# Patient Record
Sex: Female | Born: 1975 | Race: White | Hispanic: No | Marital: Single | Smoking: Never smoker
Health system: Southern US, Community
[De-identification: ages and names within clinical notes are randomized; demographics above are authoritative.]

## PROBLEM LIST (undated history)

## (undated) DIAGNOSIS — D241 Benign neoplasm of right breast: Secondary | ICD-10-CM

## (undated) DIAGNOSIS — Z113 Encounter for screening for infections with a predominantly sexual mode of transmission: Secondary | ICD-10-CM

## (undated) DIAGNOSIS — B379 Candidiasis, unspecified: Secondary | ICD-10-CM

## (undated) DIAGNOSIS — R319 Hematuria, unspecified: Secondary | ICD-10-CM

## (undated) DIAGNOSIS — E119 Type 2 diabetes mellitus without complications: Secondary | ICD-10-CM

## (undated) DIAGNOSIS — N898 Other specified noninflammatory disorders of vagina: Secondary | ICD-10-CM

## (undated) DIAGNOSIS — I10 Essential (primary) hypertension: Secondary | ICD-10-CM

## (undated) DIAGNOSIS — N946 Dysmenorrhea, unspecified: Secondary | ICD-10-CM

## (undated) DIAGNOSIS — A630 Anogenital (venereal) warts: Secondary | ICD-10-CM

## (undated) DIAGNOSIS — R51 Headache: Secondary | ICD-10-CM

## (undated) DIAGNOSIS — F4310 Post-traumatic stress disorder, unspecified: Secondary | ICD-10-CM

## (undated) DIAGNOSIS — F419 Anxiety disorder, unspecified: Secondary | ICD-10-CM

## (undated) DIAGNOSIS — R300 Dysuria: Secondary | ICD-10-CM

## (undated) DIAGNOSIS — J302 Other seasonal allergic rhinitis: Secondary | ICD-10-CM

## (undated) DIAGNOSIS — N644 Mastodynia: Secondary | ICD-10-CM

## (undated) DIAGNOSIS — N393 Stress incontinence (female) (male): Secondary | ICD-10-CM

## (undated) DIAGNOSIS — F32A Depression, unspecified: Secondary | ICD-10-CM

## (undated) DIAGNOSIS — R519 Headache, unspecified: Secondary | ICD-10-CM

## (undated) DIAGNOSIS — F329 Major depressive disorder, single episode, unspecified: Secondary | ICD-10-CM

## (undated) DIAGNOSIS — G43019 Migraine without aura, intractable, without status migrainosus: Secondary | ICD-10-CM

## (undated) DIAGNOSIS — K219 Gastro-esophageal reflux disease without esophagitis: Secondary | ICD-10-CM

## (undated) DIAGNOSIS — N63 Unspecified lump in unspecified breast: Secondary | ICD-10-CM

## (undated) HISTORY — DX: Candidiasis, unspecified: B37.9

## (undated) HISTORY — DX: Other specified noninflammatory disorders of vagina: N89.8

## (undated) HISTORY — DX: Mastodynia: N64.4

## (undated) HISTORY — DX: Migraine without aura, intractable, without status migrainosus: G43.019

## (undated) HISTORY — DX: Hematuria, unspecified: R31.9

## (undated) HISTORY — DX: Type 2 diabetes mellitus without complications: E11.9

## (undated) HISTORY — DX: Dysmenorrhea, unspecified: N94.6

## (undated) HISTORY — DX: Encounter for screening for infections with a predominantly sexual mode of transmission: Z11.3

## (undated) HISTORY — DX: Stress incontinence (female) (male): N39.3

## (undated) HISTORY — DX: Benign neoplasm of right breast: D24.1

## (undated) HISTORY — DX: Unspecified lump in unspecified breast: N63.0

## (undated) HISTORY — DX: Dysuria: R30.0

## (undated) HISTORY — DX: Post-traumatic stress disorder, unspecified: F43.10

## (undated) HISTORY — DX: Anogenital (venereal) warts: A63.0

---
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
## 2010-01-06 ENCOUNTER — Ambulatory Visit (HOSPITAL_COMMUNITY): Admission: RE | Admit: 2010-01-06 | Discharge: 2010-01-06 | Payer: Self-pay | Admitting: Obstetrics & Gynecology

## 2010-11-01 ENCOUNTER — Other Ambulatory Visit
Admission: RE | Admit: 2010-11-01 | Discharge: 2010-11-01 | Payer: Self-pay | Source: Home / Self Care | Admitting: Obstetrics and Gynecology

## 2010-11-07 ENCOUNTER — Encounter: Payer: Self-pay | Admitting: Obstetrics & Gynecology

## 2011-03-04 ENCOUNTER — Ambulatory Visit (HOSPITAL_COMMUNITY)
Admission: RE | Admit: 2011-03-04 | Discharge: 2011-03-04 | Disposition: A | Discharge status: Home / Self Care | Payer: Medicaid Other | Source: Ambulatory Visit | Attending: Family Medicine | Admitting: Family Medicine

## 2011-03-04 ENCOUNTER — Other Ambulatory Visit (HOSPITAL_COMMUNITY): Payer: Self-pay | Admitting: Family Medicine

## 2011-03-04 DIAGNOSIS — K7689 Other specified diseases of liver: Secondary | ICD-10-CM | POA: Insufficient documentation

## 2011-03-04 DIAGNOSIS — R748 Abnormal levels of other serum enzymes: Secondary | ICD-10-CM | POA: Insufficient documentation

## 2011-03-04 DIAGNOSIS — R1011 Right upper quadrant pain: Secondary | ICD-10-CM | POA: Insufficient documentation

## 2011-03-04 DIAGNOSIS — R110 Nausea: Secondary | ICD-10-CM | POA: Insufficient documentation

## 2011-03-04 MED ORDER — IOHEXOL 300 MG/ML  SOLN
100.0000 mL | Freq: Once | INTRAMUSCULAR | Status: AC | PRN
  Administered 2011-03-04: 100 mL via INTRAVENOUS

## 2011-06-09 ENCOUNTER — Other Ambulatory Visit (HOSPITAL_COMMUNITY): Payer: Self-pay | Admitting: Pediatrics

## 2011-06-13 ENCOUNTER — Ambulatory Visit (HOSPITAL_COMMUNITY)
Admission: RE | Admit: 2011-06-13 | Discharge: 2011-06-13 | Disposition: A | Discharge status: Home / Self Care | Payer: Medicaid Other | Source: Ambulatory Visit | Attending: Pediatrics | Admitting: Pediatrics

## 2011-06-13 ENCOUNTER — Encounter (HOSPITAL_COMMUNITY): Payer: Self-pay

## 2011-06-13 DIAGNOSIS — R109 Unspecified abdominal pain: Secondary | ICD-10-CM | POA: Insufficient documentation

## 2011-06-13 MED ORDER — TECHNETIUM TC 99M MEBROFENIN IV KIT
5.0000 | PACK | Freq: Once | INTRAVENOUS | Status: AC | PRN
  Administered 2011-06-13: 5.5 via INTRAVENOUS

## 2011-06-13 MED ORDER — SINCALIDE 5 MCG IJ SOLR
0.0200 ug/kg | Freq: Once | INTRAMUSCULAR | Status: AC
  Administered 2011-06-13: 1.82 ug via INTRAVENOUS
  Filled 2011-06-13: qty 1

## 2012-02-13 ENCOUNTER — Other Ambulatory Visit: Payer: Self-pay | Admitting: Obstetrics and Gynecology

## 2012-02-13 ENCOUNTER — Encounter (HOSPITAL_COMMUNITY): Payer: Self-pay

## 2012-02-13 ENCOUNTER — Other Ambulatory Visit: Payer: Self-pay

## 2012-02-13 ENCOUNTER — Encounter (HOSPITAL_COMMUNITY): Payer: Self-pay | Admitting: Pharmacy Technician

## 2012-02-13 ENCOUNTER — Encounter (HOSPITAL_COMMUNITY)
Admission: RE | Admit: 2012-02-13 | Discharge: 2012-02-13 | Disposition: A | Discharge status: Home / Self Care | Payer: Medicaid Other | Source: Ambulatory Visit | Attending: Obstetrics and Gynecology | Admitting: Obstetrics and Gynecology

## 2012-02-13 HISTORY — DX: Other seasonal allergic rhinitis: J30.2

## 2012-02-13 HISTORY — DX: Essential (primary) hypertension: I10

## 2012-02-13 HISTORY — DX: Depression, unspecified: F32.A

## 2012-02-13 HISTORY — DX: Anxiety disorder, unspecified: F41.9

## 2012-02-13 HISTORY — DX: Major depressive disorder, single episode, unspecified: F32.9

## 2012-02-13 LAB — URINALYSIS, ROUTINE W REFLEX MICROSCOPIC
Glucose, UA: NEGATIVE mg/dL
Nitrite: NEGATIVE
Specific Gravity, Urine: 1.03 (ref 1.005–1.030)

## 2012-02-13 LAB — URINE MICROSCOPIC-ADD ON

## 2012-02-13 LAB — CBC
Hemoglobin: 12.2 g/dL (ref 12.0–15.0)
MCH: 29.6 pg (ref 26.0–34.0)
MCHC: 33.5 g/dL (ref 30.0–36.0)
MCV: 88.3 fL (ref 78.0–100.0)
RBC: 4.12 MIL/uL (ref 3.87–5.11)
WBC: 9.1 10*3/uL (ref 4.0–10.5)

## 2012-02-13 LAB — HCG, SERUM, QUALITATIVE: Preg, Serum: NEGATIVE

## 2012-02-13 NOTE — Patient Instructions (Signed)
22 Cindy Stevenson  02/13/2012   Your procedure is scheduled on:  Tuesday, 02/14/12  Report to [facility] at 1220 AM.  Call this number if you have problems the morning of surgery: [phone]   Remember:   Do not eat food:After Midnight.  May have clear liquids:until Midnight .  Clear liquids include soda, tea, black coffee, apple or grape juice, broth.  Take these medicines the morning of surgery with A SIP OF WATER: wellbutrin and valium if needed.   Do not wear jewelry, make-up or nail polish.  Do not wear lotions, powders, or perfumes. You may wear deodorant.  Do not shave 48 hours prior to surgery.  Do not bring valuables to the hospital.  Contacts, dentures or bridgework may not be worn into surgery.  PATIENT INSTRUCTIONS POST-ANESTHESIA  IMMEDIATELY FOLLOWING SURGERY:  Do not drive or operate machinery for the first twenty four hours after surgery.  Do not make any important decisions for twenty four hours after surgery or while taking narcotic pain medications or sedatives.  If you develop intractable nausea and vomiting or a severe headache please notify your doctor immediately.  FOLLOW-UP:  Please make an appointment with your surgeon as instructed. You do not need to follow up with anesthesia unless specifically instructed to do so.  WOUND CARE INSTRUCTIONS (if applicable):  Keep a dry clean dressing on the anesthesia/puncture wound site if there is drainage.  Once the wound has quit draining you may leave it open to air.  Generally you should leave the bandage intact for twenty four hours unless there is drainage.  If the epidural site drains for more than 36-48 hours please call the anesthesia department.  QUESTIONS?:  Please feel free to call your physician or the hospital operator if you have any questions, and they will be happy to assist you.     [facility] Anesthesia Department [address] [place] [phone]      Leave suitcase in the car. After  surgery it may be brought to your room.  For patients admitted to the hospital, checkout time is 11:00 AM the day of discharge.   Patients discharged the day of surgery will not be allowed to drive home.  Name and phone number of your driver: driver  Special Instructions: CHG Shower Use Special Wash: 1/2 bottle night before surgery and 1/2 bottle morning of surgery.   Please read over the following fact sheets that you were given: Pain Booklet, MRSA Information, Surgical Site Infection Prevention, Anesthesia Post-op Instructions and Care and Recovery After Surgery

## 2012-02-14 ENCOUNTER — Ambulatory Visit (HOSPITAL_COMMUNITY)
Admission: RE | Admit: 2012-02-14 | Discharge: 2012-02-14 | Disposition: A | Discharge status: Home / Self Care | Payer: Medicaid Other | Source: Ambulatory Visit | Attending: Obstetrics and Gynecology | Admitting: Obstetrics and Gynecology

## 2012-02-14 ENCOUNTER — Encounter (HOSPITAL_COMMUNITY)
Admission: RE | Disposition: A | Discharge status: Home / Self Care | Payer: Self-pay | Source: Ambulatory Visit | Attending: Obstetrics and Gynecology

## 2012-02-14 ENCOUNTER — Encounter (HOSPITAL_COMMUNITY): Payer: Self-pay | Admitting: Anesthesiology

## 2012-02-14 ENCOUNTER — Ambulatory Visit (HOSPITAL_COMMUNITY): Payer: Medicaid Other | Admitting: Anesthesiology

## 2012-02-14 DIAGNOSIS — Z79899 Other long term (current) drug therapy: Secondary | ICD-10-CM | POA: Insufficient documentation

## 2012-02-14 DIAGNOSIS — N906 Unspecified hypertrophy of vulva: Secondary | ICD-10-CM | POA: Insufficient documentation

## 2012-02-14 DIAGNOSIS — Z01812 Encounter for preprocedural laboratory examination: Secondary | ICD-10-CM | POA: Insufficient documentation

## 2012-02-14 DIAGNOSIS — I10 Essential (primary) hypertension: Secondary | ICD-10-CM | POA: Insufficient documentation

## 2012-02-14 HISTORY — PX: LABIOPLASTY: SHX1900

## 2012-02-14 LAB — BASIC METABOLIC PANEL
Chloride: 103 mEq/L (ref 96–112)
Creatinine, Ser: 0.98 mg/dL (ref 0.50–1.10)
GFR calc Af Amer: 87 mL/min — ABNORMAL LOW (ref >90)
Potassium: 3.5 mEq/L (ref 3.5–5.1)
Sodium: 139 mEq/L (ref 135–145)

## 2012-02-14 SURGERY — LABIAPLASTY, VULVA
Anesthesia: General | Laterality: Right | Wound class: Clean Contaminated

## 2012-02-14 MED ORDER — LACTATED RINGERS IV SOLN
INTRAVENOUS | Status: DC
  Administered 2012-02-14: 14:00:00 via INTRAVENOUS

## 2012-02-14 MED ORDER — MIDAZOLAM HCL 2 MG/2ML IJ SOLN
1.0000 mg | INTRAMUSCULAR | Status: DC | PRN
  Administered 2012-02-14: 2 mg via INTRAVENOUS

## 2012-02-14 MED ORDER — FENTANYL CITRATE 0.05 MG/ML IJ SOLN: 25.0000 ug | INTRAMUSCULAR | Status: DC | PRN

## 2012-02-14 MED ORDER — ONDANSETRON HCL 4 MG/2ML IJ SOLN: 4.0000 mg | Freq: Once | INTRAMUSCULAR | Status: DC | PRN

## 2012-02-14 MED ORDER — FENTANYL CITRATE 0.05 MG/ML IJ SOLN
INTRAMUSCULAR | Status: DC | PRN
  Administered 2012-02-14: 50 ug via INTRAVENOUS
  Administered 2012-02-14 (×2): 25 ug via INTRAVENOUS

## 2012-02-14 MED ORDER — LIDOCAINE HCL (PF) 1 % IJ SOLN
INTRAMUSCULAR | Status: AC
  Filled 2012-02-14: qty 5

## 2012-02-14 MED ORDER — 0.9 % SODIUM CHLORIDE (POUR BTL) OPTIME
TOPICAL | Status: DC | PRN
  Administered 2012-02-14: 1000 mL

## 2012-02-14 MED ORDER — LIDOCAINE HCL 1 % IJ SOLN
INTRAMUSCULAR | Status: DC | PRN
  Administered 2012-02-14: 40 mg via INTRADERMAL

## 2012-02-14 MED ORDER — FENTANYL CITRATE 0.05 MG/ML IJ SOLN
INTRAMUSCULAR | Status: AC
  Filled 2012-02-14: qty 5

## 2012-02-14 MED ORDER — MIDAZOLAM HCL 2 MG/2ML IJ SOLN
INTRAMUSCULAR | Status: AC
  Filled 2012-02-14: qty 2

## 2012-02-14 MED ORDER — BACITRACIN ZINC 500 UNIT/GM EX OINT
TOPICAL_OINTMENT | CUTANEOUS | Status: AC
  Filled 2012-02-14: qty 0.9

## 2012-02-14 MED ORDER — PROPOFOL 10 MG/ML IV EMUL
INTRAVENOUS | Status: AC
  Filled 2012-02-14: qty 20

## 2012-02-14 MED ORDER — IBUPROFEN 200 MG PO TABS: 600.0000 mg | ORAL_TABLET | Freq: Four times a day (QID) | ORAL | Status: AC | PRN

## 2012-02-14 MED ORDER — PROPOFOL 10 MG/ML IV BOLUS
INTRAVENOUS | Status: DC | PRN
  Administered 2012-02-14: 150 mg via INTRAVENOUS

## 2012-02-14 MED ORDER — BUPIVACAINE-EPINEPHRINE 0.5% -1:200000 IJ SOLN
INTRAMUSCULAR | Status: DC | PRN
  Administered 2012-02-14: 8 mL
  Administered 2012-02-14: 5 mL

## 2012-02-14 MED ORDER — BACITRACIN ZINC 500 UNIT/GM EX OINT
TOPICAL_OINTMENT | CUTANEOUS | Status: DC | PRN
  Administered 2012-02-14: 1 via TOPICAL

## 2012-02-14 MED ORDER — OXYCODONE-ACETAMINOPHEN 5-325 MG PO TABS: 1.0000 | ORAL_TABLET | ORAL | Status: AC | PRN

## 2012-02-14 MED ORDER — MIDAZOLAM HCL 2 MG/2ML IJ SOLN
INTRAMUSCULAR | Status: AC
  Administered 2012-02-14: 2 mg via INTRAVENOUS
  Filled 2012-02-14: qty 2

## 2012-02-14 MED ORDER — BUPIVACAINE-EPINEPHRINE PF 0.5-1:200000 % IJ SOLN
INTRAMUSCULAR | Status: AC
  Filled 2012-02-14: qty 10

## 2012-02-14 SURGICAL SUPPLY — 22 items
BAG HAMPER (MISCELLANEOUS) ×2 IMPLANT
CLOTH BEACON ORANGE TIMEOUT ST (SAFETY) ×2 IMPLANT
COVER SURGICAL LIGHT HANDLE (MISCELLANEOUS) ×4 IMPLANT
DECANTER SPIKE VIAL GLASS SM (MISCELLANEOUS) ×2 IMPLANT
ELECT REM PT RETURN 9FT ADLT (ELECTROSURGICAL) ×2
ELECTRODE REM PT RTRN 9FT ADLT (ELECTROSURGICAL) ×1 IMPLANT
FORMALIN 10 PREFIL 120ML (MISCELLANEOUS) ×2 IMPLANT
GLOVE BIO SURGEON STRL SZ 6.5 (GLOVE) ×4 IMPLANT
GLOVE BIOGEL PI IND STRL 7.0 (GLOVE) ×2 IMPLANT
GLOVE BIOGEL PI INDICATOR 7.0 (GLOVE) ×2
GLOVE ECLIPSE 7.0 STRL STRAW (GLOVE) ×2 IMPLANT
GLOVE ECLIPSE 9.0 STRL (GLOVE) ×2 IMPLANT
GLOVE INDICATOR STER SZ 9 (GLOVE) ×2 IMPLANT
GOWN STRL REIN 3XL LVL4 (GOWN DISPOSABLE) ×2 IMPLANT
GOWN STRL REIN XL XLG (GOWN DISPOSABLE) ×4 IMPLANT
KIT ROOM TURNOVER AP CYSTO (KITS) ×2 IMPLANT
MANIFOLD NEPTUNE II (INSTRUMENTS) ×2 IMPLANT
NS IRRIG 1000ML POUR BTL (IV SOLUTION) ×2 IMPLANT
PACK PERI GYN (CUSTOM PROCEDURE TRAY) ×2 IMPLANT
PAD ARMBOARD 7.5X6 YLW CONV (MISCELLANEOUS) ×2 IMPLANT
SET BASIN LINEN APH (SET/KITS/TRAYS/PACK) ×2 IMPLANT
SUT VIC AB 4-0 PS2 27 (SUTURE) ×2 IMPLANT

## 2012-02-14 NOTE — Anesthesia Postprocedure Evaluation (Signed)
  Anesthesia Post-op Note  Patient: Cindy Stevenson  Procedure(s) Performed: Procedure(s) (LRB): LABIAPLASTY (Right)  Patient Location: PACU  Anesthesia Type: General  Level of Consciousness: awake, alert  and oriented  Airway and Oxygen Therapy: Patient Spontanous Breathing and Patient connected to face mask oxygen  Post-op Pain: none  Post-op Assessment: Post-op Vital signs reviewed, Patient's Cardiovascular Status Stable, Respiratory Function Stable and Patent Airway  Post-op Vital Signs: Reviewed and stable  Complications: No apparent anesthesia complications

## 2012-02-14 NOTE — Interval H&P Note (Signed)
History and Physical Interval Note:  02/14/2012 2:06 PM  Cindy Stevenson  has presented today for surgery, with the diagnosis of labial hypertrophy  The various methods of treatment have been discussed with the patient and family. After consideration of risks, benefits and other options for treatment, the patient has consented to  Procedure(s) (LRB): LABIAPLASTY (Right) as a surgical intervention .  The patients' history has been reviewed, patient examined, no change in status, stable for surgery.  I have reviewed the patients' chart and labs.  Questions were answered to the patient's satisfaction.  Patient chose against attempting office excision.   [name]

## 2012-02-14 NOTE — Brief Op Note (Signed)
02/14/2012  3:16 PM  PATIENT:  Cindy Stevenson  36 y.o. female  PRE-OPERATIVE DIAGNOSIS:  Right labial hypertrophy  POST-OPERATIVE DIAGNOSIS:  Right labial hypertrophy  PROCEDURE:  Procedure(s) (LRB): LABIAPLASTY (Right)  SURGEON:  Surgeon(s) and Role:    * [name], MD - Primary  PHYSICIAN ASSISTANT:   ASSISTANTS: none   ANESTHESIA:   local and general  EBL:  Total I/O In: 400 [I.V.:400] Out: 0   BLOOD ADMINISTERED:none  DRAINS: none   LOCAL MEDICATIONS USED:  MARCAINE    and Amount: 10 ml  SPECIMEN:  No Specimen  DISPOSITION OF SPECIMEN:  N/A  COUNTS:  YES  TOURNIQUET:  * No tourniquets in log *  DICTATION: .Dragon Dictation patient was prepped in low lithotomy position with CHG prep to avoid iodine, due to shellfish allergy.. The redundant asymmetric labial tissue on the right were inspected and efforts made to achieve symmetry by excising redundant tissues, and a 3 cm x 3 cm to, 2 layers thick portion of redundant labia minora tissue was removed and subcuticular 4-0 Vicryl used to close the incision. Marcaine with epinephrine 1 :100,000 was infiltrated prior to the cutting to assist with analgesia and vascularity. A acceptable tissue its approximation was achieved with 10 years running 4-0 Vicryl and 2 interrupted sutures of subcuticular 4-0 Vicryl a total of 10 cc of Marcaine was infiltrated in the underlying tissues pressure dressing an ice pack will be applied and patient will be sent home from her postop care  PLAN OF CARE: Discharge to home after PACU  PATIENT DISPOSITION:  PACU - hemodynamically stable.   Delay start of Pharmacological VTE agent (>24hrs) due to surgical blood loss or risk of bleeding: not applicable

## 2012-02-14 NOTE — Anesthesia Procedure Notes (Signed)
Procedure Name: LMA Insertion Date/Time: 02/14/2012 2:41 PM Performed by: Glynn Octave E Pre-anesthesia Checklist: Patient identified, Patient being monitored, Emergency Drugs available, Timeout performed and Suction available Patient Re-evaluated:Patient Re-evaluated prior to inductionOxygen Delivery Method: Circle System Utilized Preoxygenation: Pre-oxygenation with 100% oxygen Intubation Type: IV induction Ventilation: Mask ventilation without difficulty LMA: LMA inserted LMA Size: 3.0 Number of attempts: 1 Placement Confirmation: positive ETCO2 and breath sounds checked- equal and bilateral

## 2012-02-14 NOTE — Brief Op Note (Signed)
02/14/2012  3:20 PM  PATIENT:  Cindy Stevenson  36 y.o. female  PRE-OPERATIVE DIAGNOSIS:  Right labial hypertrophy  POST-OPERATIVE DIAGNOSIS:  Right labial hypertrophy  PROCEDURE:  Procedure(s) (LRB): LABIAPLASTY (Right)  SURGEON:  Surgeon(s) and Role:    * [name], MD - Primary  PHYSICIAN ASSISTANT:   ASSISTANTS: none   ANESTHESIA:   local and general  EBL:  Total I/O In: 400 [I.V.:400] Out: 0   BLOOD ADMINISTERED:none  DRAINS: none   LOCAL MEDICATIONS USED:  MARCAINE    and Amount: 10 ml  SPECIMEN:  No Specimen  DISPOSITION OF SPECIMEN:  N/A  COUNTS:  YES  TOURNIQUET:  * No tourniquets in log *  DICTATION: .Dragon Dictation  PLAN OF CARE: Discharge to home after PACU  PATIENT DISPOSITION:  PACU - hemodynamically stable.   Delay start of Pharmacological VTE agent (>24hrs) due to surgical blood loss or risk of bleeding: not applicable

## 2012-02-14 NOTE — H&P (Signed)
Cindy Stevenson is an 36 y.o. female. With asymmetric labial hypertrophy on the patient's right side which results in the labia minora on the right side protruding approximately 3 cm beyond the left, passed labia majora and subjectively painful to clothinging walking and intimacy.   Pertinent Gynecological History: Menses: flow is light Bleeding: Normal monthly Contraception: OCP (estrogen/progesterone) recently discontinued DES exposure: unknown Blood transfusions: None Sexually transmitted diseases: no past history and GC and Chlamydia cultures negative, recent testing for HIV hepatitis negative  GYN Procedures: None  Last mammogram: Not required yet Date:  Last pap: normal Date:  01/25/2012 OB History: G1, P0010   Menstrual History: Menarche age:  No LMP recorded. 12/23/2011 had discontinuation of pills at that time. Abstinent at present    Past Medical History  Diagnosis Date  . Hypertension   . Anxiety   . Seasonal allergies   . Depression     Past Surgical History  Procedure Date  . Wisdom tooth extraction 1997    Dr. Manson Passey    Family History  Problem Relation Age of Onset  . Lung cancer Mother     Social History:  reports that she has never smoked. She does not have any smokeless tobacco history on file. She reports that she does not drink alcohol or use illicit drugs.  Allergies:  Allergies  Allergen Reactions  . Codeine     Vomiting  . Shellfish Allergy     Breaks out in Hives    No prescriptions prior to admission    Review of Systems  Constitutional: Positive for fever.  Eyes: Negative for blurred vision.  Respiratory: Negative for shortness of breath.   Cardiovascular: Negative for chest pain and palpitations.  Gastrointestinal: Negative.   Genitourinary: Negative.   Neurological: Negative.  Negative for headaches. Focal weakness: current medications Wellbutrin, Valium.  Psychiatric/Behavioral: Negative for depression.       Current medications  Wellbutrin, Valium    There were no vitals taken for this visit. Physical Exam  Constitutional: She appears well-developed and well-nourished.       Weight 210 pounds blood pressure 128/88  HENT:  Head: Normocephalic and atraumatic.  Eyes: Pupils are equal, round, and reactive to light.  Neck: Neck supple. No thyromegaly present.  Cardiovascular: Normal rate and regular rhythm.   Respiratory: Effort normal and breath sounds normal.  GI: Soft. Bowel sounds are normal. She exhibits no distension. There is no tenderness.  Genitourinary: Vagina normal.       Asymmetric labia minora, right side protuberant 2-3 cm beyond the left side and the subjectively painful during intimacy  Psychiatric: She has a normal mood and affect. Her behavior is normal. Judgment and thought content normal.    Results for orders placed during the hospital encounter of 02/13/12 (from the past 24 hour(s))  SURGICAL PCR SCREEN     Status: Normal   Collection Time   02/13/12  9:49 AM      Component Value Range   MRSA, PCR NEGATIVE  NEGATIVE    Staphylococcus aureus NEGATIVE  NEGATIVE   URINALYSIS, ROUTINE W REFLEX MICROSCOPIC     Status: Abnormal   Collection Time   02/13/12  9:50 AM      Component Value Range   Color, Urine Agrusa (*) YELLOW    APPearance CLOUDY (*) CLEAR    Specific Gravity, Urine 1.030  1.005 - 1.030    pH 5.5  5.0 - 8.0    Glucose, UA NEGATIVE  NEGATIVE (mg/dL)  Hgb urine dipstick LARGE (*) NEGATIVE    Bilirubin Urine SMALL (*) NEGATIVE    Ketones, ur NEGATIVE  NEGATIVE (mg/dL)   Protein, ur 324 (*) NEGATIVE (mg/dL)   Urobilinogen, UA 0.2  0.0 - 1.0 (mg/dL)   Nitrite NEGATIVE  NEGATIVE    Leukocytes, UA NEGATIVE  NEGATIVE   URINE MICROSCOPIC-ADD ON     Status: Abnormal   Collection Time   02/13/12  9:50 AM      Component Value Range   RBC / HPF TOO NUMEROUS TO COUNT  <3 (RBC/hpf)   Bacteria, UA MANY (*) RARE   CBC     Status: Abnormal   Collection Time   02/13/12 10:00 AM       Component Value Range   WBC 9.1  4.0 - 10.5 (K/uL)   RBC 4.12  3.87 - 5.11 (MIL/uL)   Hemoglobin 12.2  12.0 - 15.0 (g/dL)   HCT 40.1  02.7 - 25.3 (%)   MCV 88.3  78.0 - 100.0 (fL)   MCH 29.6  26.0 - 34.0 (pg)   MCHC 33.5  30.0 - 36.0 (g/dL)   RDW 66.4  40.3 - 47.4 (%)   Platelets 437 (*) 150 - 400 (K/uL)  HCG, SERUM, QUALITATIVE     Status: Normal   Collection Time   02/13/12 10:00 AM      Component Value Range   Preg, Serum NEGATIVE  NEGATIVE     No results found.  Assessment/Plan:  labial hypertrophy right labia minora for outpatient labioplasty plans are to reduce size symmetrically on the right.  Richerd Grime V 02/14/2012, 12:05 AM

## 2012-02-14 NOTE — Discharge Instructions (Addendum)
Ice pack for 20 minutes every 2 hour applied to s the perineum. Light pressure may be applied if oozing develops. I called Dr. Emelda Fear at [phone] office hours or [phone] after hours (cell.) Tonight.   Wound Care Wound care helps prevent pain and infection.  You may need a tetanus shot if:  You cannot remember when you had your last tetanus shot.   You have never had a tetanus shot.   The injury broke your skin.  If you need a tetanus shot and you choose not to have one, you may get tetanus. Sickness from tetanus can be serious. HOME CARE   Only take medicine as told by your doctor.   Clean the wound daily with mild soap and water.   Change any bandages (dressings) as told by your doctor.   Put medicated cream and a bandage on the wound as told by your doctor.   Change the bandage if it gets wet, dirty, or starts to smell.   Take showers. Do not take baths, swim, or do anything that puts your wound under water.   Rest and raise (elevate) the wound until the pain and puffiness (swelling) are better.   Keep all doctor visits as told.  GET HELP RIGHT AWAY IF:   Yellowish-white fluid (pus) comes from the wound.   Medicine does not lessen your pain.   There is a red streak going away from the wound.   You cannot move your finger or toe.   You have a fever.  MAKE SURE YOU:   Understand these instructions.   Will watch your condition.   Will get help right away if you are not doing well or get worse.  Document Released: 07/12/2008 Document Revised: 09/22/2011 Document Reviewed: 02/06/2011 [facility] Patient Information 2012 [place], [place].General Anesthetic, Adult A doctor specialized in giving anesthesia (anesthesiologist) or a nurse specialized in giving anesthesia (nurse anesthetist) gives medicine that makes you sleep while a procedure is performed (general anesthetic). Once the general anesthetic has been administered, you will be in a sleeplike state in which you  feel no pain. After having a general anestheticyou may feel:   Dizzy.   Weak.   Drowsy.   Confused.  These feelings are normal and can be expected to last for up to 24 hours after the procedure is completed.  LET YOUR CAREGIVER KNOW ABOUT:  Allergies you have.   Medications you are taking, including herbs, eye drops, over the counter medications, dietary supplements, and creams.   Previous problems you have had with anesthetics or numbing medicines.   Use of cigarettes, alcohol, or illicit drugs.   Possibility of pregnancy, if this applies.   History of bleeding or blood disorders, including blood clots and clotting disorders.   Previous surgeries you have had and types of anesthetics you have received.   Family medical history, especially anesthetic problems.   Other health problems.  BEFORE THE PROCEDURE  You may brush your teeth on the morning of surgery but you should have no solid food or non-clear liquids for a minimum of 8 hours prior to your procedure. Clear liquids (water, black coffee, and tea) are acceptable in small amounts until 2 hours prior to your procedure.   You may take your regular medications the morning of your procedure unless your caregiver indicates otherwise.  AFTER THE PROCEDURE  After surgery, you will be taken to the recovery area where a nurse will monitor your progress. You will be allowed to go home when  you are awake, stable, taking fluids well, and without serious pain or complications.   For the first 24 hours following an anesthetic:   Have a responsible person with you.   Do not drive a car. If you are alone, do not take public transportation.   Do not engage in strenuous activity. You may usually resume normal activities the next day, or as advised by your caregiver.   Do not drink alcohol.   Do not take medicine that has not been prescribed by your caregiver.   Do not sign important papers or make important decisions as your  judgement may be impaired.   You may resume a normal diet as directed.   Change bandages (dressings) as directed.   Only take over-the-counter or prescription medicines for pain, discomfort, or fever as directed by your caregiver.  If you have questions or problems that seem related to the anesthetic, call the hospital and ask for the anesthetist, anesthesiologist, or anesthesia department. SEEK IMMEDIATE MEDICAL CARE IF:   You develop a rash.   You have difficulty breathing.   You have chest pain.   You have allergic problems.   You have uncontrolled nausea.   You have uncontrolled vomiting.   You develop any serious bleeding, especially from the incision site.  Document Released: 01/10/2008 Document Revised: 09/22/2011 Document Reviewed: 02/03/2011 [facility] Patient Information 2012 [place], [place].

## 2012-02-14 NOTE — Anesthesia Preprocedure Evaluation (Signed)
Anesthesia Evaluation  Patient identified by MRN, date of birth, ID band Patient awake    Reviewed: Allergy & Precautions, H&P , NPO status , Patient's Chart, lab work & pertinent test results  Airway Mallampati: II      Dental  (+) Teeth Intact   Pulmonary neg pulmonary ROS,  breath sounds clear to auscultation        Cardiovascular hypertension, Pt. on medications Rhythm:Regular Rate:Normal     Neuro/Psych PSYCHIATRIC DISORDERS Anxiety Depression    GI/Hepatic   Endo/Other    Renal/GU      Musculoskeletal   Abdominal   Peds  Hematology   Anesthesia Other Findings   Reproductive/Obstetrics                           Anesthesia Physical Anesthesia Plan  ASA: II  Anesthesia Plan: General   Post-op Pain Management:    Induction: Intravenous  Airway Management Planned: LMA  Additional Equipment:   Intra-op Plan:   Post-operative Plan: Extubation in OR  Informed Consent: I have reviewed the patients History and Physical, chart, labs and discussed the procedure including the risks, benefits and alternatives for the proposed anesthesia with the patient or authorized representative who has indicated his/her understanding and acceptance.     Plan Discussed with:   Anesthesia Plan Comments:         Anesthesia Quick Evaluation

## 2012-02-14 NOTE — Transfer of Care (Signed)
Immediate Anesthesia Transfer of Care Note  Patient: Cindy Stevenson  Procedure(s) Performed: Procedure(s) (LRB): LABIAPLASTY (Right)  Patient Location: PACU  Anesthesia Type: General  Level of Consciousness: awake, alert  and oriented  Airway & Oxygen Therapy: Patient Spontanous Breathing and Patient connected to face mask oxygen  Post-op Assessment: Report given to PACU RN  Post vital signs: Reviewed and stable  Complications: No apparent anesthesia complications

## 2012-02-16 ENCOUNTER — Encounter (HOSPITAL_COMMUNITY): Payer: Self-pay | Admitting: Obstetrics and Gynecology

## 2013-05-07 ENCOUNTER — Telehealth: Payer: Self-pay | Admitting: Adult Health

## 2013-05-07 NOTE — Telephone Encounter (Signed)
Pt c/o vaginal itching. Requesting medication for yeast infection. Pt states has tried OTC monistat with no improvement.

## 2013-05-07 NOTE — Telephone Encounter (Signed)
Left message to call back  

## 2013-05-08 MED ORDER — FLUCONAZOLE 150 MG PO TABS: ORAL_TABLET | ORAL | Status: DC

## 2013-05-08 NOTE — Telephone Encounter (Signed)
Left message to call back  

## 2013-05-08 NOTE — Telephone Encounter (Signed)
Complains of yeast, tried OTC monistat with out relief will rx diflucan

## 2013-05-08 NOTE — Addendum Note (Signed)
Addended by: Cyril Mourning A on: 05/08/2013 01:40 PM   Modules accepted: Orders

## 2013-07-24 ENCOUNTER — Other Ambulatory Visit: Payer: Self-pay | Admitting: Adult Health

## 2013-09-04 ENCOUNTER — Ambulatory Visit: Payer: Medicaid Other | Admitting: Gastroenterology

## 2013-09-06 ENCOUNTER — Encounter (INDEPENDENT_AMBULATORY_CARE_PROVIDER_SITE_OTHER): Payer: Self-pay

## 2013-09-06 ENCOUNTER — Encounter (HOSPITAL_COMMUNITY): Payer: Self-pay | Admitting: Pharmacy Technician

## 2013-09-06 ENCOUNTER — Encounter: Payer: Self-pay | Admitting: Gastroenterology

## 2013-09-06 ENCOUNTER — Ambulatory Visit (INDEPENDENT_AMBULATORY_CARE_PROVIDER_SITE_OTHER): Payer: Medicaid Other | Admitting: Gastroenterology

## 2013-09-06 VITALS — BP 111/66 | HR 81 | Temp 98.1°F | Ht 61.0 in | Wt 223.8 lb

## 2013-09-06 DIAGNOSIS — R1084 Generalized abdominal pain: Secondary | ICD-10-CM | POA: Insufficient documentation

## 2013-09-06 DIAGNOSIS — R109 Unspecified abdominal pain: Secondary | ICD-10-CM

## 2013-09-06 DIAGNOSIS — K625 Hemorrhage of anus and rectum: Secondary | ICD-10-CM | POA: Insufficient documentation

## 2013-09-06 MED ORDER — PANTOPRAZOLE SODIUM 40 MG PO TBEC: 40.0000 mg | DELAYED_RELEASE_TABLET | Freq: Every day | ORAL | Status: DC

## 2013-09-06 MED ORDER — LINACLOTIDE 145 MCG PO CAPS: 145.0000 ug | ORAL_CAPSULE | Freq: Every day | ORAL | Status: DC

## 2013-09-06 MED ORDER — PEG-KCL-NACL-NASULF-NA ASC-C 100 G PO SOLR: 1.0000 | ORAL | Status: DC

## 2013-09-06 NOTE — Patient Instructions (Signed)
Start taking Protonix once each morning, 30 minutes before breakfast.   Stop taking: Carafate and Prilosec.   For bowel habits: start taking Linzess 1 capsule each morning, 30 minutes before breakfast.   We have scheduled you for an ultrasound of your belly.   You have also been scheduled for a colonoscopy and possible upper endoscopy with Dr. Darrick Penna in the near future.

## 2013-09-06 NOTE — Progress Notes (Signed)
Primary Care Physician:  Bobbye Riggs, NP Primary Gastroenterologist:  Dr. Darrick Penna   Chief Complaint  Patient presents with  . Abdominal Pain    x1week    HPI:   Cindy Stevenson presents today at the request of Bobbye Riggs, NP, secondary to abdominal pain, rectal bleeding, and heme positive stool.   Starting Nov 8th, woke at 3am with epigastric cramping, sweating and cold,  got in bath with some improvement with heat. Happened again around 8am, with constant epigastric discomfort like a burning. Had about 5 loose stools during the episode, associated with diaphoresis, then improved. Had bright red blood per rectum that evening. Went to [facility] ED. Given Prilosec, GI cocktail. Felt bloated like a big balloon. No further rectal bleeding. Diarrhea resolved, now Bristol scale #4 (smooth/soft). Taking MOM for bowel movements. Normally does not go every day.   Pain has continued. Now continues epigastric radiating around LUQ and RUQ. At one point felt like a lot of pressure in epigastric region. Pain overall improving. However, still notes RUQ pain present, annoying, feels like something is pushing on it. Underlying nausea but no vomiting. Eating bland foods. Soups. Notes chronic history of intermittent epigastric discomfort, episodic, specifically worsened with [employer] mayonnaise. No melena. Takes Motrin or Aleve prn; took Aleve night of severe pain. No aspirin powders. Severe reflux. Took Prilosec 20 mg BID starting Nov 8th, almost done.  Carafate QID.   Gallbladder remains in situ. HIDA scan in Aug 2012 with gallbladder EF 91%, had pain with CCK infusion.   Past Medical History  Diagnosis Date  . Hypertension   . Anxiety   . Seasonal allergies   . Depression   . PTSD (post-traumatic stress disorder)     Past Surgical History  Procedure Laterality Date  . Wisdom tooth extraction  1997    Dr. Manson Passey  . Labioplasty  02/14/2012    Procedure: LABIAPLASTY;  Surgeon: [name], MD;   Location: [facility];  Service: Gynecology;  Laterality: Right;  of the right labia minora    Current Outpatient Prescriptions  Medication Sig Dispense Refill  . fexofenadine (ALLEGRA) 180 MG tablet Take 180 mg by mouth daily.      . hydrochlorothiazide (MICROZIDE) 12.5 MG capsule TAKE ONE CAPSULE BY MOUTH EVERY DAY  30 capsule  5  . omeprazole (PRILOSEC) 20 MG capsule Take 20 mg by mouth 2 (two) times daily before a meal.      . ranitidine (ZANTAC) 150 MG tablet Take 150 mg by mouth daily.      . sertraline (ZOLOFT) 50 MG tablet Take 50 mg by mouth daily.      . sucralfate (CARAFATE) 1 GM/10ML suspension Take 1 g by mouth 4 (four) times daily.       No current facility-administered medications for this visit.    Allergies as of 09/06/2013 - Review Complete 09/06/2013  Allergen Reaction Noted  . Codeine  02/13/2012  . Shellfish allergy  02/13/2012    Family History  Problem Relation Age of Onset  . Lung cancer Mother   . Colon cancer Neg Hx     History   Social History  . Marital Status: Single    Spouse Name: N/A    Number of Children: N/A  . Years of Education: N/A   Occupational History  . disability    Social History Main Topics  . Smoking status: Never Smoker   . Smokeless tobacco: Not on file  . Alcohol Use: No  .  Drug Use: No  . Sexual Activity: Not on file   Other Topics Concern  . Not on file   Social History Narrative  . No narrative on file    Review of Systems: As mentioned in HPI.   Physical Exam: BP 111/66  Pulse 81  Temp(Src) 98.1 F (36.7 C) (Oral)  Ht 5\' 1"  (1.549 m)  Wt 223 lb 12.8 oz (101.515 kg)  BMI 42.31 kg/m2  LMP 08/25/2013 General:   Alert and oriented. Well-developed, well-nourished, pleasant and cooperative. Head:  Normocephalic and atraumatic. Eyes:  Conjunctiva pink, sclera clear, no icterus.   Conjunctiva pink. Ears:  Normal auditory acuity. Nose:  No deformity, discharge,  or lesions. Mouth:  No deformity or lesions,  mucosa pink and moist.  Neck:  Supple, without mass or thyromegaly. Lungs:  Clear to auscultation bilaterally, without wheezing, rales, or rhonchi.  Heart:  S1, S2 present without murmurs noted.  Abdomen:  +BS, soft, non-tender and non-distended. Without mass or HSM. No rebound or guarding. No hernias noted. Rectal:  Deferred  Msk:  Symmetrical without gross deformities. Normal posture. Extremities:  Without clubbing or edema. Neurologic:  Alert and  oriented x4;  grossly normal neurologically. Skin:  Intact, warm and dry without significant lesions or rashes Cervical Nodes:  No significant cervical adenopathy. Psych:  Alert and cooperative. Normal mood and affect.  OUTSIDE CT AT [facility] WITH CONTRAST Aug 24 2013:  No acute findings.   Labs Nov 8:  WBC 16 originally but a week later normalized Hgb 12.1 Tbili 1.1, Alk Phos 79, AST 15, ALT 15, lipase 21

## 2013-09-06 NOTE — Assessment & Plan Note (Signed)
37 year old female with epigastric pain/LUQ/RUQ pain that has slowly improved since onset several weeks ago, associated with underlying nausea, diarrhea and incidence of rectal bleeding that has since resolved as well. No improvement in reflux or dyspepsia with Prilosec and Carafate. Appears she may have underlying constipation, using MOM for bowel movements.   Differentials fairly broad at this point, with concern for gastritis, less likely PUD in the setting of intermittent NSAIDs, possible biliary etiology due to vague RUQ discomfort, IBS, and rectal bleeding likely incidental in the setting of multiple loose stools, now resolved.   Labs unimpressive and CT benign.   Will obtain US of abdomen now; may need to consider updated HIDA if necessary.  Proceed with colonoscopy with Dr. Darrick Penna due to incidence of rectal bleeding; risks and benefits discussed and understood. Start Linzess 145 mcg daily. Stop MOM Stop Prilosec, start Protonix daily. Stop Carafate Possible EGD at time of TCS if no improvement with Protonix. As mentioned above, may ultimately need updated HIDA if [place] inconclusive.

## 2013-09-06 NOTE — Assessment & Plan Note (Signed)
Question benign source. TCS as planned.

## 2013-09-09 ENCOUNTER — Other Ambulatory Visit: Payer: Self-pay | Admitting: Gastroenterology

## 2013-09-09 ENCOUNTER — Ambulatory Visit (HOSPITAL_COMMUNITY)
Admission: RE | Admit: 2013-09-09 | Discharge: 2013-09-09 | Disposition: A | Discharge status: Home / Self Care | Payer: Medicaid Other | Source: Ambulatory Visit | Attending: Gastroenterology | Admitting: Gastroenterology

## 2013-09-09 DIAGNOSIS — R109 Unspecified abdominal pain: Secondary | ICD-10-CM

## 2013-09-09 NOTE — Progress Notes (Signed)
Cc PCP 

## 2013-09-10 ENCOUNTER — Ambulatory Visit (HOSPITAL_COMMUNITY)
Admission: RE | Admit: 2013-09-10 | Discharge: 2013-09-10 | Disposition: A | Discharge status: Home / Self Care | Payer: Medicaid Other | Source: Ambulatory Visit | Attending: Gastroenterology | Admitting: Gastroenterology

## 2013-09-10 ENCOUNTER — Encounter (HOSPITAL_COMMUNITY)
Admission: RE | Disposition: A | Discharge status: Home / Self Care | Payer: Self-pay | Source: Ambulatory Visit | Attending: Gastroenterology

## 2013-09-10 ENCOUNTER — Encounter (HOSPITAL_COMMUNITY): Payer: Self-pay | Admitting: *Deleted

## 2013-09-10 DIAGNOSIS — K648 Other hemorrhoids: Secondary | ICD-10-CM | POA: Insufficient documentation

## 2013-09-10 DIAGNOSIS — K625 Hemorrhage of anus and rectum: Secondary | ICD-10-CM | POA: Insufficient documentation

## 2013-09-10 DIAGNOSIS — R109 Unspecified abdominal pain: Secondary | ICD-10-CM

## 2013-09-10 DIAGNOSIS — K2940 Chronic atrophic gastritis without bleeding: Secondary | ICD-10-CM | POA: Insufficient documentation

## 2013-09-10 DIAGNOSIS — K620 Anal polyp: Secondary | ICD-10-CM | POA: Insufficient documentation

## 2013-09-10 DIAGNOSIS — I10 Essential (primary) hypertension: Secondary | ICD-10-CM | POA: Insufficient documentation

## 2013-09-10 DIAGNOSIS — K2970 Gastritis, unspecified, without bleeding: Secondary | ICD-10-CM

## 2013-09-10 DIAGNOSIS — K2990 Gastroduodenitis, unspecified, without bleeding: Secondary | ICD-10-CM

## 2013-09-10 DIAGNOSIS — R1031 Right lower quadrant pain: Secondary | ICD-10-CM | POA: Insufficient documentation

## 2013-09-10 DIAGNOSIS — D126 Benign neoplasm of colon, unspecified: Secondary | ICD-10-CM

## 2013-09-10 DIAGNOSIS — K222 Esophageal obstruction: Secondary | ICD-10-CM | POA: Insufficient documentation

## 2013-09-10 DIAGNOSIS — R1013 Epigastric pain: Secondary | ICD-10-CM | POA: Insufficient documentation

## 2013-09-10 DIAGNOSIS — R112 Nausea with vomiting, unspecified: Secondary | ICD-10-CM | POA: Insufficient documentation

## 2013-09-10 DIAGNOSIS — R197 Diarrhea, unspecified: Secondary | ICD-10-CM

## 2013-09-10 HISTORY — PX: ESOPHAGOGASTRODUODENOSCOPY: SHX5428

## 2013-09-10 HISTORY — PX: COLONOSCOPY: SHX5424

## 2013-09-10 SURGERY — COLONOSCOPY
Anesthesia: Moderate Sedation

## 2013-09-10 MED ORDER — PROMETHAZINE HCL 25 MG/ML IJ SOLN
INTRAMUSCULAR | Status: AC
  Filled 2013-09-10: qty 1

## 2013-09-10 MED ORDER — MIDAZOLAM HCL 5 MG/5ML IJ SOLN
INTRAMUSCULAR | Status: DC | PRN
  Administered 2013-09-10: 1 mg via INTRAVENOUS
  Administered 2013-09-10: 2 mg via INTRAVENOUS
  Administered 2013-09-10: 1 mg via INTRAVENOUS
  Administered 2013-09-10 (×2): 2 mg via INTRAVENOUS

## 2013-09-10 MED ORDER — MEPERIDINE HCL 100 MG/ML IJ SOLN
INTRAMUSCULAR | Status: AC
  Filled 2013-09-10: qty 2

## 2013-09-10 MED ORDER — PROMETHAZINE HCL 25 MG/ML IJ SOLN
12.5000 mg | Freq: Once | INTRAMUSCULAR | Status: AC
  Administered 2013-09-10: 12.5 mg via INTRAVENOUS

## 2013-09-10 MED ORDER — BUTAMBEN-TETRACAINE-BENZOCAINE 2-2-14 % EX AERO
INHALATION_SPRAY | CUTANEOUS | Status: DC | PRN
  Administered 2013-09-10: 2 via TOPICAL

## 2013-09-10 MED ORDER — SODIUM CHLORIDE 0.9 % IJ SOLN
INTRAMUSCULAR | Status: AC
  Filled 2013-09-10: qty 10

## 2013-09-10 MED ORDER — SODIUM CHLORIDE 0.9 % IV SOLN
INTRAVENOUS | Status: DC
  Administered 2013-09-10: 11:00:00 via INTRAVENOUS

## 2013-09-10 MED ORDER — PROMETHAZINE HCL 25 MG/ML IJ SOLN
INTRAMUSCULAR | Status: DC | PRN
  Administered 2013-09-10: 12.5 mg via INTRAVENOUS

## 2013-09-10 MED ORDER — STERILE WATER FOR IRRIGATION IR SOLN
Status: DC | PRN
  Administered 2013-09-10: 12:00:00

## 2013-09-10 MED ORDER — MIDAZOLAM HCL 5 MG/5ML IJ SOLN
INTRAMUSCULAR | Status: AC
  Filled 2013-09-10: qty 10

## 2013-09-10 MED ORDER — MEPERIDINE HCL 100 MG/ML IJ SOLN
INTRAMUSCULAR | Status: DC | PRN
  Administered 2013-09-10: 50 mg via INTRAVENOUS
  Administered 2013-09-10 (×2): 25 mg via INTRAVENOUS

## 2013-09-10 NOTE — Op Note (Addendum)
[facility] [address] [place], [address]   COLONOSCOPY PROCEDURE REPORT  PATIENT: Cindy Stevenson, Cindy Stevenson  MR#: [id number] BIRTHDATE: May 30, 1976 , 37  yrs. old GENDER: Female ENDOSCOPIST: Jonette Eva, MD REFERRED BY:   Bobbye Riggs, NP, [facility] PROCEDURE DATE:  09/10/2013 PROCEDURE:   Colonoscopy with cold biopsy polypectomy INDICATIONS:unexplained diarrhea, Rectal Bleeding, abdominal pain in the upper right quadrant, abdominal pain in the lower right quadrant, and epigastric abdominal pain.  usues ibuprofen 2-3x/week. AVOID GOODY POWDERS BECAUSE THEY MAKE HER STOMACH HURT. MEDICATIONS: Demerol 100 mg IV, Versed 7 mg IV, and PREOP: Promethazine (Phenergan) 12.5mg  IV/12.5 MG IV DURIING TCS  DESCRIPTION OF PROCEDURE:    Physical exam was performed.  Informed consent was obtained from the patient after explaining the benefits, risks, and alternatives to procedure.  The patient was connected to monitor and placed in left lateral position. Continuous oxygen was provided by nasal cannula and IV medicine administered through an indwelling cannula.  After administration of sedation and rectal exam, the patients rectum was intubated and the EC-3890Li ([id number]) and EG-2990i ([id number])  colonoscope was advanced under direct visualization to the ileum.  The scope was removed slowly by carefully examining the color, texture, anatomy, and integrity mucosa on the way out.  The patient was recovered in endoscopy and discharged home in satisfactory condition.    COLON FINDINGS: The mucosa appeared normal in the terminal ileum.  , A normal appearing cecum, ileocecal valve, and appendiceal orifice were identified.  The ascending, hepatic flexure, transverse, splenic flexure, descending, sigmoid colon and rectum appeared unremarkable.  No polyps or cancers were seen.  , Two sessile polyps measuring 2-3 mm in size were found in the rectum.  A polypectomy  was performed with cold forceps.  , Small internal hemorrhoids were found.  , and The colon was redundant.  Manual abdominal counter-pressure was used to reach the cecum.  PREP QUALITY: good.   CECAL W/D TIME: 14 minutes     COMPLICATIONS: None  ENDOSCOPIC IMPRESSION: 1.   Normal mucosa in the terminal ileum 2.   Normal colon 3.   Two COLON polyps REMOVED 4.   Small internal hemorrhoids  RECOMMENDATIONS: CONTINUE PROTONIX.  TAKE 30 MINUTES PRIOR TO BREAKFAST. ZANTAC HELPS MOST WHEN USED AS NEEDED. AVOID ITEMS THAT TRIGGER GASTRITIS. CONTINUE WEIGHT LOSS EFFORTS. FOLLOW A HIGH FIBER/LOW FAT DIET.  AVOID ITEMS THAT CAUSE BLOATING.   FOLLOW UP IN 4 MOS.  NEXT TCS AT AGE 37.       _______________________________ Rosalie DoctorJonette Eva, MD 09/16/2013 1:07 PM Revised: 09/16/2013 1:07 PM    PATIENT NAME:  Cindy Stevenson MR#: [id number]

## 2013-09-10 NOTE — Op Note (Signed)
[facility] [address] [place], [address]   ENDOSCOPY PROCEDURE REPORT  PATIENT: Cindy Stevenson, Cindy Stevenson  MR#: [id number] BIRTHDATE: Jun 02, 1976 , 37  yrs. old GENDER: Female  ENDOSCOPIST: Jonette Eva, MD REFERRED BY:   Bobbye Riggs, NP, [facility] PROCEDURE DATE: 09/10/2013 PROCEDURE:   EGD w/ biopsy INDICATIONS:Nausea.   Vomiting.   Unexplained diarrhea.   Epigastric pain.   abdominal pain in the upper right quadrant. USES IBUPROFEN. MEDICATIONS: TCS+ Versed 1mg  IV TOPICAL ANESTHETIC:   Cetacaine Spray  DESCRIPTION OF PROCEDURE:     Physical exam was performed.  Informed consent was obtained from the patient after explaining the benefits, risks, and alternatives to the procedure.  The patient was connected to the monitor and placed in the left lateral position.  Continuous oxygen was provided by nasal cannula and IV medicine administered through an indwelling cannula.  After administration of sedation, the patients esophagus was intubated and the EG-2990i ([id number])  endoscope was advanced under direct visualization to the second portion of the duodenum.  The scope was removed slowly by carefully examining the color, texture, anatomy, and integrity of the mucosa on the way out.  The patient was recovered in endoscopy and discharged home in satisfactory condition.   ESOPHAGUS: A Schatzki ring was found at the gastroesophageal junction and was widely open.   STOMACH: Mild non-erosive gastritis (inflammation) was found in the gastric antrum.  Multiple biopsies were performed using cold forceps.   DUODENUM: The duodenal mucosa showed no abnormalities in the bulb and second portion of the duodenum.  COMPLICATIONS:   None  ENDOSCOPIC IMPRESSION: 1.   Schatzki ring at the gastroesophageal junction 2.   MILD Non-erosive gastritis  RECOMMENDATIONS: CONTINUE PROTONIX.  TAKE 30 MINUTES PRIOR TO BREAKFAST. ZANTAC HELPS MOST WHEN USED AS  NEEDED. AVOID ITEMS THAT TRIGGER GASTRITIS. CONTINUE WEIGHT LOSS EFFORTS. FOLLOW A HIGH FIBER/LOW FAT DIET.  AVOID ITEMS THAT CAUSE BLOATING.  FOLLOW UP IN 4 MOS.  NEXT TCS AT AGE 38.   REPEAT EXAM:   _______________________________ Rosalie DoctorJonette Eva, MD 09/10/2013 12:50 PM       PATIENT NAME:  Jiali, Linney MR#: [id number]

## 2013-09-10 NOTE — Progress Notes (Signed)
REVIEWED.  

## 2013-09-10 NOTE — H&P (Addendum)
  Primary Care Physician:  Kela Millin, MD Primary Gastroenterologist:  Dr. Darrick Penna  Pre-Procedure History & Physical: HPI:  Cindy Stevenson is a 37 y.o. female here for BRBPR/ABDOMINAL PAIN.  Past Medical History  Diagnosis Date  . Hypertension   . Anxiety   . Seasonal allergies   . Depression   . PTSD (post-traumatic stress disorder)     Past Surgical History  Procedure Laterality Date  . Wisdom tooth extraction  1997    Dr. Manson Passey  . Labioplasty  02/14/2012    Procedure: LABIAPLASTY;  Surgeon: [name], MD;  Location: [facility];  Service: Gynecology;  Laterality: Right;  of the right labia minora    Prior to Admission medications   Medication Sig Start Date End Date Taking? Authorizing Provider  fexofenadine (ALLEGRA) 180 MG tablet Take 180 mg by mouth daily.   Yes Historical Provider, MD  hydrochlorothiazide (MICROZIDE) 12.5 MG capsule TAKE ONE CAPSULE BY MOUTH EVERY DAY 07/24/13  Yes Adline Potter, NP  Linaclotide (LINZESS) 145 MCG CAPS capsule Take 1 capsule (145 mcg total) by mouth daily. Take 30 minutes before breakfast. 09/06/13  Yes Nira Retort, NP  pantoprazole (PROTONIX) 40 MG tablet Take 1 tablet (40 mg total) by mouth daily. 09/06/13  Yes Nira Retort, NP  ranitidine (ZANTAC) 150 MG tablet Take 150 mg by mouth daily.   Yes Historical Provider, MD  sertraline (ZOLOFT) 50 MG tablet Take 50 mg by mouth daily.   Yes Historical Provider, MD    Allergies as of 09/06/2013 - Review Complete 09/06/2013  Allergen Reaction Noted  . Codeine  02/13/2012  . Shellfish allergy  02/13/2012    Family History  Problem Relation Age of Onset  . Lung cancer Mother   . Colon cancer Neg Hx     History   Social History  . Marital Status: Single    Spouse Name: N/A    Number of Children: N/A  . Years of Education: N/A   Occupational History  . disability    Social History Main Topics  . Smoking status: Never Smoker   . Smokeless tobacco: Not on file  .  Alcohol Use: No  . Drug Use: No  . Sexual Activity: Not on file   Other Topics Concern  . Not on file   Social History Narrative  . No narrative on file    Review of Systems: See HPI, otherwise negative ROS   Physical Exam: BP 145/102  Temp(Src) 97.9 F (36.6 C) (Oral)  Resp 20  Ht 5\' 1"  (1.549 m)  Wt 223 lb (101.152 kg)  BMI 42.16 kg/m2  SpO2 97%  LMP 08/25/2013 General:   Alert,  pleasant and cooperative in NAD Head:  Normocephalic and atraumatic. Neck:  Supple; Lungs:  Clear throughout to auscultation.    Heart:  Regular rate and rhythm. Abdomen:  Soft, nontender and nondistended. Normal bowel sounds, without guarding, and without rebound.   Neurologic:  Alert and  oriented x4;  grossly normal neurologically.  Impression/Plan:     BRBPR/ABDOMINAL PAIN  PLAN: EGD/TCS TODAY

## 2013-09-13 ENCOUNTER — Telehealth: Payer: Self-pay | Admitting: Gastroenterology

## 2013-09-13 NOTE — Telephone Encounter (Signed)
Please call pt. She had HYPERPLASTIC POLYPS removed from her RECTUM. HER stomach Bx shows gastritis. HER NAUSEA/VOMITING/ABD PAIN ARE DUE TO GASTRITIS, REFLUX, AND CONSTIPATION.   CONTINUE YOUR WEIGHT LOSS EFFORTS. CONTINUE PROTONIX. TAKE 30 MINUTES PRIOR TO BREAKFAST. ZANTAC HELPS MOST WHEN USED AS NEEDED. CONTINUE LINZESS. AVOID IBUPROFEN AND OTHER ITEMS THAT TRIGGER GASTRITIS.  DRINK WATER TO KEEP YOUR URINE LIGHT YELLOW.  FOLLOW A HIGH FIBER/LOW FAT DIET. AVOID ITEMS THAT CAUSE BLOATING.  FOLLOW UP IN 4 MOS E30 W/ AS OR SLF.  NEXT TCS IN 10 YEARS

## 2013-09-16 NOTE — Telephone Encounter (Signed)
Called and informed pt.  

## 2013-09-16 NOTE — Progress Notes (Signed)
Quick Note:    Noted    ______

## 2013-09-17 ENCOUNTER — Encounter (HOSPITAL_COMMUNITY): Payer: Self-pay | Admitting: Gastroenterology

## 2013-09-18 NOTE — Progress Notes (Signed)
Quick Note:  Fatty liver.  Outside LFTs normal Nov 2014. Schedule for HIDA if persistent pain with follow-up with me.   ______

## 2013-09-19 NOTE — Progress Notes (Signed)
Quick Note:  Called and informed pt. ______ 

## 2013-10-29 LAB — COMPREHENSIVE METABOLIC PANEL
ALT: 15 U/L (ref 7–35)
AST: 13 U/L
Alkaline Phosphatase: 79 U/L
BILIRUBIN TOTAL: 1.2 mg/dL

## 2013-10-29 LAB — CBC
HEMATOCRIT: 39 %
HEMOGLOBIN: 13.9 g/dL
WBC: 10.5

## 2014-01-18 ENCOUNTER — Other Ambulatory Visit: Payer: Self-pay | Admitting: Gastroenterology

## 2014-02-27 ENCOUNTER — Encounter: Payer: Self-pay | Admitting: Adult Health

## 2014-02-27 ENCOUNTER — Other Ambulatory Visit (HOSPITAL_COMMUNITY)
Admission: RE | Admit: 2014-02-27 | Discharge: 2014-02-27 | Disposition: A | Discharge status: Home / Self Care | Payer: Medicaid Other | Source: Ambulatory Visit | Attending: Adult Health | Admitting: Adult Health

## 2014-02-27 ENCOUNTER — Ambulatory Visit (INDEPENDENT_AMBULATORY_CARE_PROVIDER_SITE_OTHER): Payer: Medicaid Other | Admitting: Adult Health

## 2014-02-27 VITALS — BP 116/82 | HR 78 | Ht 61.5 in | Wt 218.5 lb

## 2014-02-27 DIAGNOSIS — R300 Dysuria: Secondary | ICD-10-CM | POA: Insufficient documentation

## 2014-02-27 DIAGNOSIS — N946 Dysmenorrhea, unspecified: Secondary | ICD-10-CM

## 2014-02-27 DIAGNOSIS — Z0000 Encounter for general adult medical examination without abnormal findings: Secondary | ICD-10-CM

## 2014-02-27 DIAGNOSIS — Z113 Encounter for screening for infections with a predominantly sexual mode of transmission: Secondary | ICD-10-CM

## 2014-02-27 DIAGNOSIS — N393 Stress incontinence (female) (male): Secondary | ICD-10-CM

## 2014-02-27 DIAGNOSIS — Z01419 Encounter for gynecological examination (general) (routine) without abnormal findings: Secondary | ICD-10-CM | POA: Insufficient documentation

## 2014-02-27 DIAGNOSIS — R319 Hematuria, unspecified: Secondary | ICD-10-CM

## 2014-02-27 DIAGNOSIS — Z1151 Encounter for screening for human papillomavirus (HPV): Secondary | ICD-10-CM | POA: Insufficient documentation

## 2014-02-27 HISTORY — DX: Stress incontinence (female) (male): N39.3

## 2014-02-27 HISTORY — DX: Dysmenorrhea, unspecified: N94.6

## 2014-02-27 HISTORY — DX: Hematuria, unspecified: R31.9

## 2014-02-27 HISTORY — DX: Dysuria: R30.0

## 2014-02-27 LAB — POCT URINALYSIS DIPSTICK
Blood, UA: POSITIVE
GLUCOSE UA: NEGATIVE
Leukocytes, UA: NEGATIVE
Nitrite, UA: NEGATIVE
Protein, UA: NEGATIVE

## 2014-02-27 MED ORDER — HYDROCODONE-ACETAMINOPHEN 5-325 MG PO TABS: 1.0000 | ORAL_TABLET | Freq: Four times a day (QID) | ORAL | Status: DC | PRN

## 2014-02-27 NOTE — Patient Instructions (Signed)
Dysmenorrhea Menstrual cramps (dysmenorrhea) are caused by the muscles of the uterus tightening (contracting) during a menstrual period. For some women, this discomfort is merely bothersome. For others, dysmenorrhea can be severe enough to interfere with everyday activities for a few days each month. Primary dysmenorrhea is menstrual cramps that last a couple of days when you start having menstrual periods or soon after. This often begins after a teenager starts having her period. As a woman gets older or has a baby, the cramps will usually lessen or disappear. Secondary dysmenorrhea begins later in life, lasts longer, and the pain may be stronger than primary dysmenorrhea. The pain may start before the period and last a few days after the period.  CAUSES  Dysmenorrhea is usually caused by an underlying problem, such as:  The tissue lining the uterus grows outside of the uterus in other areas of the body (endometriosis).  The endometrial tissue, which normally lines the uterus, is found in or grows into the muscular walls of the uterus (adenomyosis).  The pelvic blood vessels are engorged with blood just before the menstrual period (pelvic congestive syndrome).  Overgrowth of cells (polyps) in the lining of the uterus or cervix.  Falling down of the uterus (prolapse) because of loose or stretched ligaments.  Depression.  Bladder problems, infection, or inflammation.  Problems with the intestine, a tumor, or irritable bowel syndrome.  Cancer of the female organs or bladder.  A severely tipped uterus.  A very tight opening or closed cervix.  Noncancerous tumors of the uterus (fibroids).  Pelvic inflammatory disease (PID).  Pelvic scarring (adhesions) from a previous surgery.  Ovarian cyst.  An intrauterine device (IUD) used for birth control. RISK FACTORS You may be at greater risk of dysmenorrhea if:  You are younger than age 62.  You started puberty early.  You have  irregular or heavy bleeding.  You have never given birth.  You have a family history of this problem.  You are a smoker. SIGNS AND SYMPTOMS   Cramping or throbbing pain in your lower abdomen.  Headaches.  Lower back pain.  Nausea or vomiting.  Diarrhea.  Sweating or dizziness.  Loose stools. DIAGNOSIS  A diagnosis is based on your history, symptoms, physical exam, diagnostic tests, or procedures. Diagnostic tests or procedures may include:  Blood tests.  Ultrasonography.  An examination of the lining of the uterus (dilation and curettage, D&C).  An examination inside your abdomen or pelvis with a scope (laparoscopy).  X-rays.  CT scan.  MRI.  An examination inside the bladder with a scope (cystoscopy).  An examination inside the intestine or stomach with a scope (colonoscopy, gastroscopy). TREATMENT  Treatment depends on the cause of the dysmenorrhea. Treatment may include:  Pain medicine prescribed by your health care provider.  Birth control pills or an IUD with progesterone hormone in it.  Hormone replacement therapy.  Nonsteroidal anti-inflammatory drugs (NSAIDs). These may help stop the production of prostaglandins.  Surgery to remove adhesions, endometriosis, ovarian cyst, or fibroids.  Removal of the uterus (hysterectomy).  Progesterone shots to stop the menstrual period.  Cutting the nerves on the sacrum that go to the female organs (presacral neurectomy).  Electric current to the sacral nerves (sacral nerve stimulation).  Antidepressant medicine.  Psychiatric therapy, counseling, or group therapy.  Exercise and physical therapy.  Meditation and yoga therapy.  Acupuncture. HOME CARE INSTRUCTIONS   Only take over-the-counter or prescription medicines as directed by your health care provider.  Place a heating pad  or hot water bottle on your lower back or abdomen. Do not sleep with the heating pad.  Use aerobic exercises, walking,  swimming, biking, and other exercises to help lessen the cramping.  Massage to the lower back or abdomen may help.  Stop smoking.  Avoid alcohol and caffeine. SEEK MEDICAL CARE IF:   Your pain does not get better with medicine.  You have pain with sexual intercourse.  Your pain increases and is not controlled with medicines.  You have abnormal vaginal bleeding with your period.  You develop nausea or vomiting with your period that is not controlled with medicine. SEEK IMMEDIATE MEDICAL CARE IF:  You pass out.  Document Released: 10/03/2005 Document Revised: 06/05/2013 Document Reviewed: 03/21/2013 [facility] Patient Information 2014 [place]. Return in 1 week for [place] and see me

## 2014-02-27 NOTE — Progress Notes (Signed)
Patient ID: MAGDALENA SKILTON, female   DOB: 1976/01/17, 38 y.o.   MRN: [id number] History of Present Illness: Hydee is a 38 year old white female, single in for a pap and physical, she is complaining of burning with urination,leaks when sneezes, and bad period cramps, will get in fetal position at times..Had sex last weekend for first time in 3 years, used a condom.Has bump left labia after shaving.Complains of hot flashes and sweating.  Current Medications, Allergies, Past Medical History, Past Surgical History, Family History and Social History were reviewed in [employer] record.     Review of Systems: Patient denies any headaches, blurred vision, shortness of breath, chest pain, abdominal pain, problems with bowel movements, urination, or intercourse. No joint pain or mood changes, takes meds for depression and anxiety.    Physical Exam:BP 116/82  Pulse 78  Ht 5' 1.5" (1.562 m)  Wt 218 lb 8 oz (99.111 kg)  BMI 40.62 kg/m2  LMP 04/14/2015urine trace blood, General:  Well developed, well nourished, no acute distress Skin:  Warm and dry Neck:  Midline trachea, normal thyroid Lungs; Clear to auscultation bilaterally Breast:  No dominant palpable mass, retraction, or nipple discharge, has several cherry hemangiomas. Cardiovascular: Regular rate and rhythm Abdomen:  Soft, non tender, no hepatosplenomegaly Pelvic:  External genitalia is normal in appearance.  The vagina is normal in appearance, but has what looks like hair bump left labia. The cervix is smooth,pap performed with HPV and GC/CHL.  Uterus is felt to be normal size, shape, and contour.  No adnexal masses or tenderness noted. Extremities:  No swelling or varicosities noted Psych:  Alert and cooperative,seems happy,but nervous   Impression: Yearly gyn exam Burning with urination Hematuria SUI Dysmenorrhea STD testing    Plan: Return in 1 week for [place] and see me Check HIV,RPR,HSV 2, TSH and  FSH Urine sent for UA C&S Physical in 1 year Rx norco 5-325 mg #30 1 every 6 hours prn cramps no refills

## 2014-02-28 ENCOUNTER — Telehealth: Payer: Self-pay | Admitting: Adult Health

## 2014-02-28 LAB — URINALYSIS
Bilirubin Urine: NEGATIVE
Glucose, UA: NEGATIVE mg/dL
Hgb urine dipstick: NEGATIVE
KETONES UR: NEGATIVE mg/dL
Leukocytes, UA: NEGATIVE
Nitrite: NEGATIVE
PROTEIN: NEGATIVE mg/dL
Specific Gravity, Urine: 1.024 (ref 1.005–1.030)
UROBILINOGEN UA: 0.2 mg/dL (ref 0.0–1.0)
pH: 5.5 (ref 5.0–8.0)

## 2014-02-28 LAB — HIV ANTIBODY (ROUTINE TESTING W REFLEX): HIV 1&2 Ab, 4th Generation: NONREACTIVE

## 2014-02-28 LAB — HSV 2 ANTIBODY, IGG: HSV 2 Glycoprotein G Ab, IgG: 0.53 IV

## 2014-02-28 LAB — FOLLICLE STIMULATING HORMONE: FSH: 3.2 m[IU]/mL

## 2014-02-28 LAB — RPR

## 2014-02-28 LAB — TSH: TSH: 2.651 u[IU]/mL (ref 0.350–4.500)

## 2014-02-28 NOTE — Telephone Encounter (Signed)
Left message labs normal, will see next week

## 2014-03-01 LAB — URINE CULTURE
COLONY COUNT: NO GROWTH
Organism ID, Bacteria: NO GROWTH

## 2014-03-06 ENCOUNTER — Encounter: Payer: Self-pay | Admitting: Adult Health

## 2014-03-06 ENCOUNTER — Other Ambulatory Visit: Payer: Self-pay | Admitting: Adult Health

## 2014-03-06 ENCOUNTER — Ambulatory Visit (INDEPENDENT_AMBULATORY_CARE_PROVIDER_SITE_OTHER): Payer: Medicaid Other

## 2014-03-06 ENCOUNTER — Ambulatory Visit (INDEPENDENT_AMBULATORY_CARE_PROVIDER_SITE_OTHER): Payer: Medicaid Other | Admitting: Adult Health

## 2014-03-06 ENCOUNTER — Telehealth: Payer: Self-pay | Admitting: *Deleted

## 2014-03-06 VITALS — BP 120/80 | Ht 61.5 in | Wt 217.0 lb

## 2014-03-06 DIAGNOSIS — N946 Dysmenorrhea, unspecified: Secondary | ICD-10-CM

## 2014-03-06 DIAGNOSIS — Z3202 Encounter for pregnancy test, result negative: Secondary | ICD-10-CM

## 2014-03-06 DIAGNOSIS — Z7251 High risk heterosexual behavior: Secondary | ICD-10-CM

## 2014-03-06 LAB — POCT URINE PREGNANCY: Preg Test, Ur: NEGATIVE

## 2014-03-06 MED ORDER — LEVONORGESTREL 1.5 MG PO TABS: 1.5000 mg | ORAL_TABLET | Freq: Once | ORAL | Status: DC

## 2014-03-06 NOTE — Patient Instructions (Addendum)
Call when period starts will wants birth control Take PLAN B

## 2014-03-06 NOTE — Progress Notes (Signed)
Subjective:     Patient ID: Cindy Stevenson, female   DOB: [birth year], 38 y.o.   MRN: [id number]  HPI Cindy Stevenson is a 38 year old white female in for [place] for dysmenorrhea.She had unprotected sex Saturday.  Review of Systems See HPI Reviewed past medical,surgical, social and family history. Reviewed medications and allergies.     Objective:   Physical Exam BP 120/80  Ht 5' 1.5" (1.562 m)  Wt 217 lb (98.431 kg)  BMI 40.34 kg/m2  LMP 04/14/2015UPT negative, reviewed [place] with pt.   Uterus 6.0 x 4.5 x 3.8 cm, anteverted no myometrial masses noted  Endometrium 10.3 mm, symmetrical,  Right ovary 3.3 x 2.3 x 2.0 cm,  Left ovary 2.6 x 1.7 x 1.2 cm, (tender to palp with vaginal probe) appears to be "adhered" to uterine fundus (unable to separate lt ovary from uterus with pressure applied with vaginal probe)  No free fluid or adnexal masses noted  Technician Comments:  Anteverted uterus noted, Endom-10.29mm, Rt ovary appears WNL, Lt ovary appears to "adhered" to uterine fundus (tender to palp with vaginal probe), no free fluid or adnexal masses noted within the pelvis  Discussed emergency contraception and need for birth control.encouraged to use condoms.   Assessment:     Dysmenorrhea Unprotected sex    Plan:     Rx plan B Call with menses to get on birth control.

## 2014-03-06 NOTE — Telephone Encounter (Signed)
Spoke with pt. Pt states pharmacy told her Medicaid will not pay for Plan B, and it's $50.00. I spoke with Anderson Malta who advised she would have to pay for it out of pocket. Pt informed and said she couldn't pay for it, and will let us know if her period does or doesn't start. [place]

## 2014-03-07 ENCOUNTER — Telehealth: Payer: Self-pay | Admitting: *Deleted

## 2014-03-07 MED ORDER — NORGESTIM-ETH ESTRAD TRIPHASIC 0.18/0.215/0.25 MG-35 MCG PO TABS: 1.0000 | ORAL_TABLET | Freq: Every day | ORAL | Status: DC

## 2014-03-07 NOTE — Telephone Encounter (Signed)
Pt states started her period last night was to let [name] know so she can prescribed birth control.

## 2014-03-07 NOTE — Telephone Encounter (Signed)
Will rx tri sprintec

## 2014-03-21 ENCOUNTER — Telehealth: Payer: Self-pay

## 2014-03-21 NOTE — Telephone Encounter (Signed)
On OCs, has BTB and cramping try 4 advil and increased fluids, if bad go to ER, conitnue OCs

## 2014-03-27 ENCOUNTER — Encounter: Payer: Self-pay | Admitting: Adult Health

## 2014-03-27 ENCOUNTER — Ambulatory Visit (INDEPENDENT_AMBULATORY_CARE_PROVIDER_SITE_OTHER): Payer: Medicaid Other | Admitting: Adult Health

## 2014-03-27 ENCOUNTER — Ambulatory Visit: Payer: Medicaid Other | Admitting: Adult Health

## 2014-03-27 VITALS — BP 110/66 | Ht 61.5 in | Wt 218.0 lb

## 2014-03-27 DIAGNOSIS — Z113 Encounter for screening for infections with a predominantly sexual mode of transmission: Secondary | ICD-10-CM

## 2014-03-27 DIAGNOSIS — R300 Dysuria: Secondary | ICD-10-CM

## 2014-03-27 HISTORY — DX: Encounter for screening for infections with a predominantly sexual mode of transmission: Z11.3

## 2014-03-27 LAB — POCT URINALYSIS DIPSTICK
Blood, UA: NEGATIVE
Glucose, UA: NEGATIVE
Leukocytes, UA: NEGATIVE
Nitrite, UA: NEGATIVE

## 2014-03-27 LAB — POCT WET PREP (WET MOUNT)
Trichomonas Wet Prep HPF POC: NEGATIVE
WBC, Wet Prep HPF POC: POSITIVE

## 2014-03-27 NOTE — Patient Instructions (Signed)
Sexually Transmitted Disease A sexually transmitted disease (STD) is a disease or infection that may be passed (transmitted) from person to person, usually during sexual activity. This may happen by way of saliva, semen, blood, vaginal mucus, or urine. Common STDs include:   Gonorrhea.   Chlamydia.   Syphilis.   HIV and AIDS.   Genital herpes.   Hepatitis B and C.   Trichomonas.   Human papillomavirus (HPV).   Pubic lice.   Scabies.  Mites.  Bacterial vaginosis. WHAT ARE CAUSES OF STDs? An STD may be caused by bacteria, a virus, or parasites. STDs are often transmitted during sexual activity if one person is infected. However, they may also be transmitted through nonsexual means. STDs may be transmitted after:   Sexual intercourse with an infected person.   Sharing sex toys with an infected person.   Sharing needles with an infected person or using unclean piercing or tattoo needles.  Having intimate contact with the genitals, mouth, or rectal areas of an infected person.   Exposure to infected fluids during birth. WHAT ARE THE SIGNS AND SYMPTOMS OF STDs? Different STDs have different symptoms. Some people may not have any symptoms. If symptoms are present, they may include:   Painful or bloody urination.   Pain in the pelvis, abdomen, vagina, anus, throat, or eyes.   Skin rash, itching, irritation, growths, sores (lesions), ulcerations, or warts in the genital or anal area.  Abnormal vaginal discharge with or without bad odor.   Penile discharge in men.   Fever.   Pain or bleeding during sexual intercourse.   Swollen glands in the groin area.   Yellow skin and eyes (jaundice). This is seen with hepatitis.   Swollen testicles.  Infertility.  Sores and blisters in the mouth. HOW ARE STDs DIAGNOSED? To make a diagnosis, your health care provider may:   Take a medical history.   Perform a physical exam.   Take a sample of any  discharge for examination.  Swab the throat, cervix, opening to the penis, rectum, or vagina for testing.  Test a sample of your first morning urine.   Perform blood tests.   Perform a Pap smear, if this applies.   Perform a colposcopy.   Perform a laparoscopy.  HOW ARE STDs TREATED? Treatment depends on the STD. Some STDs may be treated but not cured.   Chlamydia, gonorrhea, trichomonas, and syphilis can be cured with antibiotics.   Genital herpes, hepatitis, and HIV can be treated, but not cured, with prescribed medicines. The medicines lessen symptoms.   Genital warts from HPV can be treated with medicine or by freezing, burning (electrocautery), or surgery. Warts may come back.   HPV cannot be cured with medicine or surgery. However, abnormal areas may be removed from the cervix, vagina, or vulva.   If your diagnosis is confirmed, your recent sexual partners need treatment. This is true even if they are symptom-free or have a negative culture or evaluation. They should not have sex until their health care providers say it is OK. HOW CAN I REDUCE MY RISK OF GETTING AN STD?  Use latex condoms, dental dams, and water-soluble lubricants during sexual activity. Do not use petroleum jelly or oils.  Get vaccinated for HPV and hepatitis. If you have not received these vaccines in the past, talk to your health care provider about whether one or both might be right for you.   Avoid risky sex practices that can break the skin.  WHAT SHOULD   I DO IF I THINK I HAVE AN STD?  See your health care provider.   Inform all sexual partners. They should be tested and treated for any STDs.  Do not have sex until your health care provider says it is OK. WHEN SHOULD I GET HELP? Seek immediate medical care if:  You develop severe abdominal pain.  You are a man and notice swelling or pain in the testicles.  You are a woman and notice swelling or pain in your vagina. Document  Released: 12/24/2002 Document Revised: 07/24/2013 Document Reviewed: 04/23/2013 [facility] Patient Information 2014 [place], [place]. Push fluids USE CONDOMS

## 2014-03-27 NOTE — Progress Notes (Signed)
Subjective:     Patient ID: Cindy Stevenson, female   DOB: October 06, 1976, 38 y.o.   MRN: [id number]  HPI Serinity is a 38 year old white female in complaining of burning with urination and wants STD testing,She has had unprotected sex and her partner's ex wife says he has trich.She is taking her OCs and had blood tests for HSV,RPR and HIV that were negative in May.  Review of Systems See HPI Reviewed past medical,surgical, social and family history. Reviewed medications and allergies.     Objective:   Physical Exam BP 110/66  Ht 5' 1.5" (1.562 m)  Wt 218 lb (98.884 kg)  BMI 40.53 kg/m2  LMP 05/21/2015urine dipstick trace protein, Skin warm and dry.Pelvic: external genitalia is normal in appearance, vagina:scant white discharge without odor, cervix:smooth, uterus: normal size, shape and contour, non tender, no masses felt, adnexa: no masses or tenderness noted. Wet prep:  +WBCs. GC/CHL obtained.      Assessment:    STD screening Burning with urination    Plan:    GC/CHL sent Review handout on STDs USE CONDOMS Follow up prn

## 2014-03-28 LAB — GC/CHLAMYDIA PROBE AMP
CT PROBE, AMP APTIMA: NEGATIVE
GC Probe RNA: NEGATIVE

## 2014-03-31 ENCOUNTER — Telehealth: Payer: Self-pay | Admitting: Adult Health

## 2014-03-31 MED ORDER — IBUPROFEN 800 MG PO TABS: 800.0000 mg | ORAL_TABLET | Freq: Three times a day (TID) | ORAL | Status: DC | PRN

## 2014-03-31 NOTE — Telephone Encounter (Signed)
Left message I Called, can call back

## 2014-03-31 NOTE — Telephone Encounter (Signed)
Complains of pain left side try Motrin 800 mg, if no help make appt with Dr Glo Herring

## 2014-03-31 NOTE — Addendum Note (Signed)
Addended by: Derrek Monaco A on: 03/31/2014 04:58 PM   Modules accepted: Orders

## 2014-06-01 ENCOUNTER — Other Ambulatory Visit: Payer: Self-pay | Admitting: Gastroenterology

## 2014-06-01 ENCOUNTER — Other Ambulatory Visit: Payer: Self-pay | Admitting: Adult Health

## 2014-08-18 ENCOUNTER — Encounter: Payer: Self-pay | Admitting: Adult Health

## 2014-09-10 ENCOUNTER — Telehealth: Payer: Self-pay | Admitting: Adult Health

## 2014-09-10 MED ORDER — FLUCONAZOLE 150 MG PO TABS: 150.0000 mg | ORAL_TABLET | Freq: Once | ORAL | Status: DC

## 2014-09-10 NOTE — Telephone Encounter (Signed)
Will rx diflucan  

## 2014-09-10 NOTE — Telephone Encounter (Signed)
Spoke with pt. She is requesting Diflucan. She was put on antibiotics by another dr and always gets yeast infections after antibiotics. Thanks!! [place]

## 2014-09-24 ENCOUNTER — Other Ambulatory Visit: Payer: Self-pay | Admitting: Adult Health

## 2014-12-02 ENCOUNTER — Other Ambulatory Visit: Payer: Self-pay | Admitting: Adult Health

## 2015-01-23 ENCOUNTER — Encounter: Payer: Self-pay | Admitting: Adult Health

## 2015-01-23 ENCOUNTER — Telehealth: Payer: Self-pay | Admitting: *Deleted

## 2015-01-23 ENCOUNTER — Ambulatory Visit (INDEPENDENT_AMBULATORY_CARE_PROVIDER_SITE_OTHER): Payer: Medicaid Other | Admitting: Adult Health

## 2015-01-23 VITALS — BP 130/80 | HR 92 | Ht 61.0 in | Wt 214.0 lb

## 2015-01-23 DIAGNOSIS — R300 Dysuria: Secondary | ICD-10-CM | POA: Diagnosis not present

## 2015-01-23 DIAGNOSIS — A630 Anogenital (venereal) warts: Secondary | ICD-10-CM | POA: Diagnosis not present

## 2015-01-23 DIAGNOSIS — R319 Hematuria, unspecified: Secondary | ICD-10-CM

## 2015-01-23 HISTORY — DX: Anogenital (venereal) warts: A63.0

## 2015-01-23 LAB — POCT URINALYSIS DIPSTICK
GLUCOSE UA: NEGATIVE
Nitrite, UA: NEGATIVE
Protein, UA: NEGATIVE

## 2015-01-23 MED ORDER — IMIQUIMOD 5 % EX CREA: TOPICAL_CREAM | CUTANEOUS | Status: DC

## 2015-01-23 MED ORDER — NITROFURANTOIN MONOHYD MACRO 100 MG PO CAPS: 100.0000 mg | ORAL_CAPSULE | Freq: Two times a day (BID) | ORAL | Status: DC

## 2015-01-23 MED ORDER — HYDROCHLOROTHIAZIDE 12.5 MG PO CAPS: 12.5000 mg | ORAL_CAPSULE | Freq: Every day | ORAL | Status: DC

## 2015-01-23 NOTE — Patient Instructions (Signed)
Genital Warts Genital warts are a sexually transmitted infection. They may appear as small bumps on the tissues of the genital area. CAUSES  Genital warts are caused by a virus called human papillomavirus (HPV). HPV is the most common sexually transmitted disease (STD) and infection of the sex organs. This infection is spread by having unprotected sex with an infected person. It can be spread by vaginal, anal, and oral sex. Many people do not know they are infected. They may be infected for years without problems. However, even if they do not have problems, they can unknowingly pass the infection to their sexual partners. SYMPTOMS   Itching and irritation in the genital area.  Warts that bleed.  Painful sexual intercourse. DIAGNOSIS  Warts are usually recognized with the naked eye on the vagina, vulva, perineum, anus, and rectum. Certain tests can also diagnose genital warts, such as:  A Pap test.  A tissue sample (biopsy) exam.  Colposcopy. A magnifying tool is used to examine the vagina and cervix. The HPV cells will change color when certain solutions are used. TREATMENT  Warts can be removed by:  Applying certain chemicals, such as cantharidin or podophyllin.  Liquid nitrogen freezing (cryotherapy).  Immunotherapy with Candida or Trichophyton injections.  Laser treatment.  Burning with an electrified probe (electrocautery).  Interferon injections.  Surgery. PREVENTION  HPV vaccination can help prevent HPV infections that cause genital warts and that cause cancer of the cervix. It is recommended that the vaccination be given to people between the ages 9 to 26 years old. The vaccine might not work as well or might not work at all if you already have HPV. It should not be given to pregnant women. HOME CARE INSTRUCTIONS   It is important to follow your caregiver's instructions. The warts will not go away without treatment. Repeat treatments are often needed to get rid of warts.  Even after it appears that the warts are gone, the normal tissue underneath often remains infected.  Do not try to treat genital warts with medicine used to treat hand warts. This type of medicine is strong and can burn the skin in the genital area, causing more damage.  Tell your past and current sexual partner(s) that you have genital warts. They may be infected also and need treatment.  Avoid sexual contact while being treated.  Do not touch or scratch the warts. The infection may spread to other parts of your body.  Women with genital warts should have a cervical cancer check (Pap test) at least once a year. This type of cancer is slow-growing and can be cured if found early. Chances of developing cervical cancer are increased with HPV.  Inform your obstetrician about your warts in the event of pregnancy. This virus can be passed to the baby's respiratory tract. Discuss this with your caregiver.  Use a condom during sexual intercourse. Following treatment, the use of condoms will help prevent reinfection.  Ask your caregiver about using over-the-counter anti-itch creams. SEEK MEDICAL CARE IF:   Your treated skin becomes red, swollen, or painful.  You have a fever.  You feel generally ill.  You feel little lumps in and around your genital area.  You are bleeding or have painful sexual intercourse. MAKE SURE YOU:   Understand these instructions.  Will watch your condition.  Will get help right away if you are not doing well or get worse. Document Released: 09/30/2000 Document Revised: 02/17/2014 Document Reviewed: 04/11/2011 Exit[employer] Patient Information 2015 [employer]. This   information is not intended to replace advice given to you by your health care provider. Make sure you discuss any questions you have with your health care provider. Use aldara 3 x weekly Recheck in 4 weeks Increase fluids

## 2015-01-23 NOTE — Telephone Encounter (Signed)
Pt states Aldara cost is $300.00, insurance will not cover. Is there an alternative.

## 2015-01-23 NOTE — Progress Notes (Signed)
Subjective:     Patient ID: Cindy Stevenson, female   DOB: Dec 28, 1975, 39 y.o.   MRN: [id number]  HPI Cindy Stevenson is a 39 year old white female in complaining of burning with urination and knot in vagina, wants to be checked for STD too.  Review of Systems +burning with urination +knot in vagina, all other systems negative Reviewed past medical,surgical, social and family history. Reviewed medications and allergies.     Objective:   Physical Exam BP 130/80 mmHg  Pulse 92  Ht 5\' 1"  (1.549 m)  Wt 214 lb (97.07 kg)  BMI 40.46 kg/m2  LMP 03/08/2016urine trace leuks and trace blood, Skin warm and dry.Pelvic: external genitalia is normal in appearance, has firm .5 cm white topped nodule left inner labia minora, tender, vagina: scant discharge without odor,urethra has no lesions or masses noted, cervix:smooth and bulbous, uterus: normal size, shape and contour, non tender, no masses felt, adnexa: no masses or tenderness noted. Bladder is non tender and no masses felt. GC/CHL obtained.    Has been out of microzide requests refill, is looking for PCP.  Assessment:     Burning with urination Genital warts Hematuria     Plan:     UA C&S sent GC/CHL Rx macrobid 1 bid x 7 days Rx aldara use 3 x weekly with 3 refills Review handout on warts Push fluids Refilled microzide 12.5 mg #30 with 5 refills at her request

## 2015-01-23 NOTE — Telephone Encounter (Signed)
Will make appt for wart removal with dr Glo Herring, could not afford aldara

## 2015-01-24 LAB — URINE CULTURE: Organism ID, Bacteria: NO GROWTH

## 2015-01-24 LAB — URINALYSIS, ROUTINE W REFLEX MICROSCOPIC
BILIRUBIN UA: NEGATIVE
GLUCOSE, UA: NEGATIVE
Ketones, UA: NEGATIVE
Nitrite, UA: NEGATIVE
RBC UA: NEGATIVE
SPEC GRAV UA: 1.024 (ref 1.005–1.030)
Urobilinogen, Ur: 0.2 mg/dL (ref 0.2–1.0)
pH, UA: 6 (ref 5.0–7.5)

## 2015-01-24 LAB — MICROSCOPIC EXAMINATION: Casts: NONE SEEN /lpf

## 2015-01-26 ENCOUNTER — Telehealth: Payer: Self-pay | Admitting: Adult Health

## 2015-01-26 LAB — GC/CHLAMYDIA PROBE AMP
Chlamydia trachomatis, NAA: NEGATIVE
Neisseria gonorrhoeae by PCR: NEGATIVE

## 2015-01-26 NOTE — Telephone Encounter (Signed)
Pt aware GC/CHL negative 

## 2015-01-28 ENCOUNTER — Ambulatory Visit (INDEPENDENT_AMBULATORY_CARE_PROVIDER_SITE_OTHER): Payer: Medicaid Other | Admitting: Obstetrics and Gynecology

## 2015-01-28 ENCOUNTER — Encounter: Payer: Self-pay | Admitting: Obstetrics and Gynecology

## 2015-01-28 VITALS — BP 120/80 | Ht 61.0 in | Wt 214.0 lb

## 2015-01-28 DIAGNOSIS — A630 Anogenital (venereal) warts: Secondary | ICD-10-CM | POA: Diagnosis not present

## 2015-01-28 MED ORDER — TRICHLOROACETIC ACID 80 % EX LIQD: 1.0000 "application " | Freq: Once | CUTANEOUS | Status: DC

## 2015-01-28 NOTE — Progress Notes (Signed)
Patient ID: Cindy Stevenson, female   DOB: 1976/05/24, 39 y.o.   MRN: [id number] Pt here today for follow up. Pt state that she is going to have an area removed from her vagina.

## 2015-01-28 NOTE — Progress Notes (Signed)
Patient ID: Cindy Stevenson, female   DOB: [birth year], 39 y.o.   MRN: [id number]  This chart was SCRIBED for Cindy Shirk, MD by Stephania Fragmin, ED Scribe. This patient was seen in room 2 and the patient's care was started at [facility] Visit  Patient name: Cindy Stevenson MRN [id number]  Date of birth: 1976/08/27  CC & HPI:  JALYIAH Stevenson is a 39 y.o. female presenting today for follow-up and evaluation of a constant "knot" on her left inner labia minora that is hard, feels like a "BB," and is painful to touch; with onset 2 weeks ago. She states she noticed it when wiping after urination.    ROS:  A complete review of systems was obtained and all systems are negative except as noted in the HPI and PMH.    Pertinent History Reviewed:   Reviewed: Significant for labioplasty Medical         Past Medical History  Diagnosis Date  . Hypertension   . Anxiety   . Seasonal allergies   . Depression   . PTSD (post-traumatic stress disorder)   . Burning with urination 02/27/2014  . Hematuria 02/27/2014  . SUI (stress urinary incontinence, female) 02/27/2014  . Dysmenorrhea 02/27/2014  . Screening for STD (sexually transmitted disease) 03/27/2014  . Warts, genital 01/23/2015                              Surgical Hx:    Past Surgical History  Procedure Laterality Date  . Wisdom tooth extraction  1997    Dr. Owens Shark  . Labioplasty  02/14/2012    Procedure: LABIAPLASTY;  Surgeon: Jonnie Kind, MD;  Location: [facility];  Service: Gynecology;  Laterality: Right;  of the right labia minora  . Colonoscopy N/A 09/10/2013    Procedure: COLONOSCOPY;  Surgeon: Danie Binder, MD;  Location: [facility];  Service: Endoscopy;  Laterality: N/A;  12:00  . Esophagogastroduodenoscopy N/A 09/10/2013    Procedure: ESOPHAGOGASTRODUODENOSCOPY (EGD);  Surgeon: Danie Binder, MD;  Location: [facility];  Service: Endoscopy;  Laterality: N/A;   Medications: Reviewed & Updated - see associated  section                       Current outpatient prescriptions:  .  clonazePAM (KLONOPIN) 0.5 MG tablet, Take 0.5 mg by mouth 3 (three) times daily as needed for anxiety., Disp: , Rfl:  .  [employer] ALLERGY RELIEF 180 MG tablet, 180 mg daily. , Disp: , Rfl: 11 .  gabapentin (NEURONTIN) 300 MG capsule, 300 mg 3 (three) times daily. , Disp: , Rfl: 1 .  hydrochlorothiazide (MICROZIDE) 12.5 MG capsule, Take 1 capsule (12.5 mg total) by mouth daily., Disp: 30 capsule, Rfl: 5 .  ibuprofen (ADVIL,MOTRIN) 800 MG tablet, TAKE 1 TABLET BY MOUTH EVERY 8 HOURS AS NEEDED, Disp: 60 tablet, Rfl: 1 .  imiquimod (ALDARA) 5 % cream, Apply topically 3 (three) times a week., Disp: 12 each, Rfl: 0 .  nitrofurantoin, macrocrystal-monohydrate, (MACROBID) 100 MG capsule, Take 1 capsule (100 mg total) by mouth 2 (two) times daily., Disp: 14 capsule, Rfl: 0 .  Norgestimate-Ethinyl Estradiol Triphasic 0.18/0.215/0.25 MG-35 MCG tablet, Take 1 tablet by mouth daily. (Patient not taking: Reported on 01/23/2015), Disp: 1 Package, Rfl: 11 .  pantoprazole (PROTONIX) 40 MG tablet, TAKE 1 TABLET BY MOUTH EVERY DAY, Disp: 30  tablet, Rfl: 11 .  ranitidine (ZANTAC) 150 MG tablet, Take 150 mg by mouth 2 (two) times daily., Disp: , Rfl:  .  sertraline (ZOLOFT) 100 MG tablet, 100 mg daily. , Disp: , Rfl: 1   Social History: Reviewed -  reports that she has never smoked. She has never used smokeless tobacco.  Objective Findings:  Vitals: Blood pressure 120/80, height 5\' 1"  (1.549 m), weight 214 lb (97.07 kg), last menstrual period 12/23/2014.  Physical Examination: General appearance - alert, well appearing, and in no distress, oriented to person, place, and time and overweight Mental status - alert, oriented to person, place, and time, normal mood, behavior, speech, dress, motor activity, and thought processes, affect appropriate to mood Pelvic - normal external genitalia, vulva, vagina, cervix, uterus and adnexa,  Pt's area of concern is  only an area of oil glands beneath the skin lateral to the left labia minora. Pt reassured VAGINA: normal appearing vagina with normal color and discharge, no lesions,      Assessment & Plan:   A:  1. 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  P:  1. F/u in 2 weeks for TCA tx. Rx to [facility] I personally performed the services described in this documentation, which was SCRIBED in my presence. The recorded information has been reviewed and considered accurate. It has been edited as necessary during review. Jonnie Kind, MD

## 2015-02-09 ENCOUNTER — Telehealth: Payer: Self-pay | Admitting: Adult Health

## 2015-02-09 NOTE — Telephone Encounter (Signed)
Spoke with pt. Pt is on Triphasic. She notices when she starts a new pack, her face breaks out. The breaking out lasts the whole pack. Please advise. Thanks!! [place]

## 2015-02-10 MED ORDER — NORETHIN ACE-ETH ESTRAD-FE 1-20 MG-MCG PO TABS: 1.0000 | ORAL_TABLET | Freq: Every day | ORAL | Status: DC

## 2015-02-10 NOTE — Telephone Encounter (Signed)
Complains of face breaking out with OCs will change to junel 1/20 use condoms

## 2015-02-16 ENCOUNTER — Encounter: Payer: Self-pay | Admitting: Obstetrics and Gynecology

## 2015-02-16 ENCOUNTER — Ambulatory Visit (INDEPENDENT_AMBULATORY_CARE_PROVIDER_SITE_OTHER): Payer: Medicaid Other | Admitting: Obstetrics and Gynecology

## 2015-02-16 VITALS — BP 110/76 | Ht 61.0 in | Wt 222.0 lb

## 2015-02-16 DIAGNOSIS — A630 Anogenital (venereal) warts: Secondary | ICD-10-CM | POA: Diagnosis not present

## 2015-02-16 NOTE — Progress Notes (Signed)
Patient ID: Cindy Stevenson, female   DOB: 01-27-1976, 39 y.o.   MRN: [id number] Pt here today for follow up and TCA treatment. Pt states that the area that she had Dr. Glo Herring check last time is hurting now and she would like for him to check it again. Pt is very nervious about the procedure.

## 2015-02-16 NOTE — Progress Notes (Signed)
Patient ID: Cindy Stevenson, female   DOB: 01-12-1976, 39 y.o.   MRN: [id number]    [facility] Visit  Patient name: Cindy Stevenson MRN [id number]  Date of birth: 02-01-1976  CC & HPI:  Cindy Stevenson is a 39 y.o. female s/p labioplasty presenting today for follow-up of a single condyloma on her left labia minora and TCA treatment. Pt was seen on 4/16 for the same. She notes increased pain with wiping, but denies the appearance of any new anogenital warts. Pt has had regular, normal Pap Smears.  ROS:  A complete 10 system review of systems was obtained and all systems are negative except as noted in the HPI and PMH.   Pertinent History Reviewed:   Reviewed: Significant for genital warts; labioplasty Medical         Past Medical History  Diagnosis Date  . Hypertension   . Anxiety   . Seasonal allergies   . Depression   . PTSD (post-traumatic stress disorder)   . Burning with urination 02/27/2014  . Hematuria 02/27/2014  . SUI (stress urinary incontinence, female) 02/27/2014  . Dysmenorrhea 02/27/2014  . Screening for STD (sexually transmitted disease) 03/27/2014  . Warts, genital 01/23/2015                              Surgical Hx:    Past Surgical History  Procedure Laterality Date  . Wisdom tooth extraction  1997    Dr. Owens Shark  . Labioplasty  02/14/2012    Procedure: LABIAPLASTY;  Surgeon: Jonnie Kind, MD;  Location: [facility];  Service: Gynecology;  Laterality: Right;  of the right labia minora  . Colonoscopy N/A 09/10/2013    Procedure: COLONOSCOPY;  Surgeon: Danie Binder, MD;  Location: [facility];  Service: Endoscopy;  Laterality: N/A;  12:00  . Esophagogastroduodenoscopy N/A 09/10/2013    Procedure: ESOPHAGOGASTRODUODENOSCOPY (EGD);  Surgeon: Danie Binder, MD;  [facility]: [facility];  Service: Endoscopy;  Laterality: N/A;   Medications: Reviewed & Updated - see associated section                       Current outpatient prescriptions:  .  clonazePAM  (KLONOPIN) 0.5 MG tablet, Take 0.5 mg by mouth 3 (three) times daily as needed for anxiety., Disp: , Rfl:  .  [employer] ALLERGY RELIEF 180 MG tablet, 180 mg daily. , Disp: , Rfl: 11 .  gabapentin (NEURONTIN) 300 MG capsule, 300 mg 3 (three) times daily. , Disp: , Rfl: 1 .  hydrochlorothiazide (MICROZIDE) 12.5 MG capsule, Take 1 capsule (12.5 mg total) by mouth daily., Disp: 30 capsule, Rfl: 5 .  ibuprofen (ADVIL,MOTRIN) 800 MG tablet, TAKE 1 TABLET BY MOUTH EVERY 8 HOURS AS NEEDED, Disp: 60 tablet, Rfl: 1 .  norethindrone-ethinyl estradiol (JUNEL FE 1/20) 1-20 MG-MCG tablet, Take 1 tablet by mouth daily., Disp: 1 Package, Rfl: 11 .  ranitidine (ZANTAC) 150 MG tablet, Take 150 mg by mouth 2 (two) times daily., Disp: , Rfl:  .  sertraline (ZOLOFT) 100 MG tablet, 100 mg daily. , Disp: , Rfl: 1 .  trichloroacetic acid 80 % LIQD, Apply 1 application topically once., Disp: 15 mL, Rfl: 0 .  imiquimod (ALDARA) 5 % cream, Apply topically 3 (three) times a week. (Patient not taking: Reported on 02/16/2015), Disp: 12 each, Rfl: 0   Social History: Reviewed -  reports that she has  never smoked. She has never used smokeless tobacco.  Objective Findings:  Vitals: Blood pressure 110/76, height 5\' 1"  (1.549 m), weight 222 lb (100.699 kg), last menstrual period 01/28/2015.  Physical Examination: General appearance - alert, well appearing, and in no distress and oriented to person, place, and time Mental status - alert, oriented to person, place, and time, normal mood, behavior, speech, dress, motor activity, and thought processes Pelvic - normal external genitalia, vulva, vagina, cervix, uterus and adnexa VULVA: normal appearing vulva with no masses, tenderness or lesions; single condyloma of left labia minora, pt notes tenderness in inferior margin of Left L minora, no lesions today VAGINA: normal appearing vagina with normal color and discharge, no lesions CERVIX: normal appearing cervix without discharge or  lesions UTERUS: uterus is normal size, shape, consistency and nontender ADNEXA: normal adnexa in size, nontender and no masses   Assessment & Plan:   A:  1. Single condyloma of left labia minora 2. TCA treatment 3. Persistent discomfort from sebaceous cyst at inferior end of left L. [place] P:  1. TCA tx done 2. Pt to consider partial excision of L L minora.   This chart was scribed for Jonnie Kind, MD by Tula Nakayama, ED Scribe. This patient was seen in room 1 and the patient's care was started at 11:03 AM.   I personally performed the services described in this documentation, which was SCRIBED in my presence. The recorded information has been reviewed and considered accurate. It has been edited as necessary during review. Jonnie Kind, MD

## 2015-02-19 ENCOUNTER — Ambulatory Visit: Payer: Medicaid Other | Admitting: Adult Health

## 2015-02-22 ENCOUNTER — Other Ambulatory Visit: Payer: Self-pay | Admitting: Adult Health

## 2015-03-03 ENCOUNTER — Encounter: Payer: Self-pay | Admitting: Obstetrics and Gynecology

## 2015-03-03 ENCOUNTER — Ambulatory Visit (INDEPENDENT_AMBULATORY_CARE_PROVIDER_SITE_OTHER): Payer: Medicaid Other | Admitting: Obstetrics and Gynecology

## 2015-03-03 VITALS — BP 120/78 | Ht 61.0 in | Wt 222.0 lb

## 2015-03-03 DIAGNOSIS — A630 Anogenital (venereal) warts: Secondary | ICD-10-CM

## 2015-03-03 DIAGNOSIS — R102 Pelvic and perineal pain: Secondary | ICD-10-CM | POA: Diagnosis not present

## 2015-03-03 NOTE — Progress Notes (Signed)
Patient ID: Cindy Stevenson, female   DOB: 10-13-1976, 39 y.o.   MRN: [id number] Pt here today for follow up. Pt states that the area has gotten worse, bleeds with wiping.    [facility] Visit  Patient name: Cindy Stevenson MRN [id number]  Date of birth: 09/24/1976  CC & HPI:  Cindy Stevenson is a 39 y.o. female presenting today for tender nodule in left labia minora remnant.  Pt recently tx'd with TCA for vulvar wart, and complained of a tiny 3 mm hard knot in the inferior edge of the left labia minora, which ends at mid vagina, Feels like being stuck with a needle in the left labia minora, and she wants excision of the area , even tho it is visually fairly well healed. The condyloma has healed.  ROS:  The pt wishes for excision asap. I am on vacation next week so will postpone til my return.  Pertinent History Reviewed:   Reviewed: Significant for n/a Medical         Past Medical History  Diagnosis Date  . Hypertension   . Anxiety   . Seasonal allergies   . Depression   . PTSD (post-traumatic stress disorder)   . Burning with urination 02/27/2014  . Hematuria 02/27/2014  . SUI (stress urinary incontinence, female) 02/27/2014  . Dysmenorrhea 02/27/2014  . Screening for STD (sexually transmitted disease) 03/27/2014  . Warts, genital 01/23/2015                              Surgical Hx:    Past Surgical History  Procedure Laterality Date  . Wisdom tooth extraction  1997    Dr. Owens Shark  . Labioplasty  02/14/2012    Procedure: LABIAPLASTY;  Surgeon: Jonnie Kind, MD;  Location: [facility];  Service: Gynecology;  Laterality: Right;  of the right labia minora  . Colonoscopy N/A 09/10/2013    Procedure: COLONOSCOPY;  Surgeon: Danie Binder, MD;  Location: [facility];  Service: Endoscopy;  Laterality: N/A;  12:00  . Esophagogastroduodenoscopy N/A 09/10/2013    Procedure: ESOPHAGOGASTRODUODENOSCOPY (EGD);  Surgeon: Danie Binder, MD;  Location[facility];  Service: Endoscopy;   Laterality: N/A;   Medications: Reviewed & Updated - see associated section                       Current outpatient prescriptions:  .  clonazePAM (KLONOPIN) 0.5 MG tablet, Take 0.5 mg by mouth 3 (three) times daily as needed for anxiety., Disp: , Rfl:  .  [employer] ALLERGY RELIEF 180 MG tablet, 180 mg daily. , Disp: , Rfl: 11 .  gabapentin (NEURONTIN) 300 MG capsule, 300 mg 3 (three) times daily. , Disp: , Rfl: 1 .  hydrochlorothiazide (MICROZIDE) 12.5 MG capsule, Take 1 capsule (12.5 mg total) by mouth daily., Disp: 30 capsule, Rfl: 5 .  ibuprofen (ADVIL,MOTRIN) 800 MG tablet, TAKE 1 TABLET BY MOUTH EVERY 8 HOURS AS NEEDED, Disp: 60 tablet, Rfl: 1 .  imiquimod (ALDARA) 5 % cream, Apply topically 3 (three) times a week. (Patient not taking: Reported on 02/16/2015), Disp: 12 each, Rfl: 0 .  norethindrone-ethinyl estradiol (JUNEL FE 1/20) 1-20 MG-MCG tablet, Take 1 tablet by mouth daily., Disp: 1 Package, Rfl: 11 .  ranitidine (ZANTAC) 150 MG tablet, Take 150 mg by mouth 2 (two) times daily., Disp: , Rfl:  .  sertraline (ZOLOFT) 100 MG tablet,  100 mg daily. , Disp: , Rfl: 1 .  trichloroacetic acid 80 % LIQD, Apply 1 application topically once., Disp: 15 mL, Rfl: 0   Social History: Reviewed -  reports that she has never smoked. She has never used smokeless tobacco.  Objective Findings:  Vitals: Blood pressure 120/78, height 5\' 1"  (1.549 m), weight 222 lb (100.699 kg), last menstrual period 02/17/2015.  Physical Examination: General appearance - alert, well appearing, and in no distress and anxious Pelvic - VULVA: vulvar tenderness in inferior edge of left labia with evidence of recent healing from an excoriation. I cannot see where it could have bled from last nite.   Assessment & Plan:   A:  1. Tender nodule in left labia minora 2. Resolved condyloma  P:  1. rechk 2 wk, then excise left left labia minora in Same day if pt still insists on tx.

## 2015-03-18 ENCOUNTER — Ambulatory Visit (INDEPENDENT_AMBULATORY_CARE_PROVIDER_SITE_OTHER): Payer: Medicaid Other | Admitting: Obstetrics and Gynecology

## 2015-03-18 ENCOUNTER — Encounter: Payer: Self-pay | Admitting: Obstetrics and Gynecology

## 2015-03-18 VITALS — BP 124/74 | HR 80 | Ht 61.0 in | Wt 220.2 lb

## 2015-03-18 DIAGNOSIS — N907 Vulvar cyst: Secondary | ICD-10-CM | POA: Diagnosis not present

## 2015-03-18 DIAGNOSIS — L723 Sebaceous cyst: Secondary | ICD-10-CM | POA: Diagnosis not present

## 2015-03-18 NOTE — Progress Notes (Signed)
Patient ID: Cindy Stevenson, female   DOB: [birth year], 39 y.o.   MRN: [id number]  Preoperative History and Physical  Cindy Stevenson is a 39 y.o. G1P0010 here for surgical management of Persistent discomfort from nodule on left L. [place].   No significant preoperative concerns. Patient reports she has been doing yard-work in the past few days, which has exacerbated the pain. Patient has been taking birth control in the hopes of treating her pain, but she has had no relief.   Proposed surgery: excisional vulvar biopsy.    Past Medical History  Diagnosis Date  . Hypertension   . Anxiety   . Seasonal allergies   . Depression   . PTSD (post-traumatic stress disorder)   . Burning with urination 02/27/2014  . Hematuria 02/27/2014  . SUI (stress urinary incontinence, female) 02/27/2014  . Dysmenorrhea 02/27/2014  . Screening for STD (sexually transmitted disease) 03/27/2014  . Warts, genital 01/23/2015   Past Surgical History  Procedure Laterality Date  . Wisdom tooth extraction  1997    Dr. Owens Shark  . Labioplasty  02/14/2012    Procedure: LABIAPLASTY;  Surgeon: Jonnie Kind, MD;  Location: [facility];  Service: Gynecology;  Laterality: Right;  of the right labia minora  . Colonoscopy N/A 09/10/2013    Procedure: COLONOSCOPY;  Surgeon: Danie Binder, MD;  Location: [facility];  Service: Endoscopy;  Laterality: N/A;  12:00  . Esophagogastroduodenoscopy N/A 09/10/2013    Procedure: ESOPHAGOGASTRODUODENOSCOPY (EGD);  Surgeon: Danie Binder, MD;  Location: [facility];  Service: Endoscopy;  Laterality: N/A;   OB History  Gravida Para Term Preterm AB SAB TAB Ectopic Multiple Living  1    1  1        # Outcome Date GA Lbr Len/2nd Weight Sex Delivery Anes PTL Lv  1 TAB             Patient denies any other pertinent gynecologic issues.   Current Outpatient Prescriptions on File Prior to Visit  Medication Sig Dispense Refill  . [facility] ALLERGY RELIEF 180 MG tablet 180 mg daily.   11  . gabapentin  (NEURONTIN) 300 MG capsule 300 mg 3 (three) times daily.   1  . hydrochlorothiazide (MICROZIDE) 12.5 MG capsule Take 1 capsule (12.5 mg total) by mouth daily. 30 capsule 5  . ibuprofen (ADVIL,MOTRIN) 800 MG tablet TAKE 1 TABLET BY MOUTH EVERY 8 HOURS AS NEEDED 60 tablet 1  . norethindrone-ethinyl estradiol (JUNEL FE 1/20) 1-20 MG-MCG tablet Take 1 tablet by mouth daily. 1 Package 11  . ranitidine (ZANTAC) 150 MG tablet Take 150 mg by mouth 2 (two) times daily.    . sertraline (ZOLOFT) 100 MG tablet 100 mg daily.   1  . clonazePAM (KLONOPIN) 0.5 MG tablet Take 0.5 mg by mouth 3 (three) times daily as needed for anxiety.    . imiquimod (ALDARA) 5 % cream Apply topically 3 (three) times a week. (Patient not taking: Reported on 02/16/2015) 12 each 0  . trichloroacetic acid 80 % LIQD Apply 1 application topically once. (Patient not taking: Reported on 03/18/2015) 15 mL 0   No current facility-administered medications on file prior to visit.   Allergies  Allergen Reactions  . Codeine     Vomiting  . Shellfish Allergy     Breaks out in Hives  . Amoxicillin Rash    Social History:   reports that she has never smoked. She has never used smokeless tobacco. She reports that she does  not drink alcohol or use illicit drugs.  Family History  Problem Relation Age of Onset  . Lung cancer Mother   . Colon cancer Neg Hx   . Asthma Sister   . Allergies Sister   . Mental illness Maternal Aunt     depression, PTSD  . Diabetes Maternal Grandmother   . Cancer Maternal Grandfather     skin    Review of Systems: Noncontributory  PHYSICAL EXAM: Blood pressure 124/74, pulse 80, height 5\' 1"  (1.549 m), weight 220 lb 3.2 oz (99.882 kg), last menstrual period 02/17/2015. General appearance - alert, well appearing, and in no distress Chest - clear to auscultation, no wheezes, rales or rhonchi, symmetric air entry Heart - normal rate and regular rhythm Abdomen - soft, nontender, nondistended, no masses or  organomegaly Pelvic - examination not indicated Small, 1 cm tender nodule, recurrent at the lateral aspects of the left labia minora Extremities - peripheral pulses normal, no pedal edema, no clubbing or cyanosi  Labs: No results found for this or any previous visit (from the past 336 hour(s)).  Imaging Studies: No results found.  Assessment: Patient Active Problem List   Diagnosis Date Noted  . Warts, genital 01/23/2015  . Screening for STD (sexually transmitted disease) 03/27/2014  . Burning with urination 02/27/2014  . Hematuria 02/27/2014  . SUI (stress urinary incontinence, female) 02/27/2014  . Dysmenorrhea 02/27/2014  . Abdominal pain, other specified site 09/06/2013  . Rectal bleeding 09/06/2013    Plan: Patient will undergo surgical management with excisional vulvar biopsy. Will do in office today under local anesthesia VULVAR BIOPSY NOTE The indications for vulvar biopsy,)( remove symptomatic cyst) were reviewed.   Risks of the biopsy including pain, bleeding, infection, inadequate specimen, scarring and need for additional procedures  were discussed. The patient stated understanding and agreed to undergo procedure today. Consent was signed,  time out performed. 2 The patient's vulva was prepped with Betadine. 1% lidocaine was injected into left labia minora. A elliptical  biopsy was done, biopsy tissue was picked up with sterile forceps and sterile scissors were used to excise the lesion.  Small bleeding was noted and hemostasis was achieved by sub q 4-0 vicryl then 4-0 prolene in skin.  The patient tolerated the procedure well. Post-procedure instructions  (pelvic rest for one week) were given to the patient. The patient is to call with heavy bleeding, fever greater than 100.4, foul smelling vaginal discharge or other concerns. The patient will be return to clinic in  One weeks for removal of the stitches.  .mec 03/18/2015 2:51 PM   This chart was SCRIBED for Mallory Shirk,  MD by Stephania Fragmin, ED Scribe. This patient was seen in room 1 and the patient's care was started at 2:50 PM.   I personally performed the services described in this documentation, which was SCRIBED in my presence. The recorded information has been reviewed and considered accurate. It has been edited as necessary during review. Jonnie Kind, MD

## 2015-03-25 ENCOUNTER — Ambulatory Visit (INDEPENDENT_AMBULATORY_CARE_PROVIDER_SITE_OTHER): Payer: Medicaid Other | Admitting: Obstetrics and Gynecology

## 2015-03-25 ENCOUNTER — Encounter: Payer: Self-pay | Admitting: Obstetrics and Gynecology

## 2015-03-25 VITALS — BP 156/98 | HR 68 | Wt 221.2 lb

## 2015-03-25 DIAGNOSIS — Z4802 Encounter for removal of sutures: Secondary | ICD-10-CM | POA: Diagnosis not present

## 2015-03-25 DIAGNOSIS — Z09 Encounter for follow-up examination after completed treatment for conditions other than malignant neoplasm: Secondary | ICD-10-CM | POA: Insufficient documentation

## 2015-03-25 NOTE — Progress Notes (Signed)
Patient ID: Cindy Stevenson, female   DOB: 10-Jul-1976, 39 y.o.   MRN: [id number]  Subjective:  Cindy Stevenson is a 39 y.o. female now 1 weeks status post excisional vulvar biopsy done in-office.  She is presenting today for a suture removal. Patient has been icing the area with moderate relief. Patient recently ended a relationship that began in February.   Review of Systems Negative  r diet:   normal   Bowel movements : normal.  Pain is controlled without any medications.  Objective:  BP 156/98 mmHg  Pulse 68  Wt 221 lb 3.2 oz (100.336 kg)  LMP 02/17/2015 General:Well developed, well nourished.  No acute distress. Abdomen: Bowel sounds normal, soft, non-tender. Pelvic Exam:    External Genitalia:  Well-healing incision. Suture removed.  Incision(s):   Healing well, no drainage, no erythema, no hernia, no swelling, no dehiscence,  Assessment:  Post-Op 1 weeks s/p excisional vulvar biopsy done in-office Sutures removed.   Doing well postoperatively.   Plan:  1.Wound care discussed   2. . current medications. n/a  3. Activity restrictions: none 4. return to work: none 5. Follow up prn.   This chart was SCRIBED for Mallory Shirk, MD by Stephania Fragmin, ED Scribe. This patient was seen in room 1 and the patient's care was started at 12:25 PM.  I personally performed the services described in this documentation, which was SCRIBED in my presence. The recorded information has been reviewed and considered accurate. It has been edited as necessary during review. Jonnie Kind, MD

## 2015-04-23 ENCOUNTER — Other Ambulatory Visit: Payer: Self-pay | Admitting: Adult Health

## 2015-05-13 ENCOUNTER — Other Ambulatory Visit: Payer: Self-pay | Admitting: Adult Health

## 2015-05-20 ENCOUNTER — Ambulatory Visit: Payer: Medicaid Other | Admitting: Obstetrics and Gynecology

## 2015-06-03 ENCOUNTER — Other Ambulatory Visit: Payer: Self-pay | Admitting: Gastroenterology

## 2015-06-03 NOTE — Telephone Encounter (Signed)
Limited refills. Patient needs OV in next 2 months.

## 2015-06-04 ENCOUNTER — Encounter: Payer: Self-pay | Admitting: Gastroenterology

## 2015-06-04 NOTE — Telephone Encounter (Signed)
APPOINTMENT MADE AND LETTER SENT °

## 2015-06-04 NOTE — Telephone Encounter (Signed)
LMOM for pt that she will need and OV.

## 2015-07-13 ENCOUNTER — Ambulatory Visit: Payer: Medicaid Other | Admitting: Gastroenterology

## 2015-07-28 ENCOUNTER — Ambulatory Visit: Payer: Medicaid Other | Admitting: Gastroenterology

## 2015-07-28 ENCOUNTER — Encounter: Payer: Self-pay | Admitting: Gastroenterology

## 2015-07-28 ENCOUNTER — Telehealth: Payer: Self-pay | Admitting: Gastroenterology

## 2015-07-28 NOTE — Telephone Encounter (Signed)
PATIENT WAS A NO SHOW AND LETTER SENT  °

## 2015-08-03 ENCOUNTER — Ambulatory Visit: Payer: Medicaid Other | Admitting: Adult Health

## 2015-08-06 ENCOUNTER — Ambulatory Visit (INDEPENDENT_AMBULATORY_CARE_PROVIDER_SITE_OTHER): Payer: Medicaid Other | Admitting: Adult Health

## 2015-08-06 ENCOUNTER — Encounter: Payer: Self-pay | Admitting: Adult Health

## 2015-08-06 ENCOUNTER — Other Ambulatory Visit: Payer: Self-pay | Admitting: Adult Health

## 2015-08-06 VITALS — BP 130/90 | HR 90 | Ht 61.0 in | Wt 228.0 lb

## 2015-08-06 DIAGNOSIS — B379 Candidiasis, unspecified: Secondary | ICD-10-CM

## 2015-08-06 DIAGNOSIS — N644 Mastodynia: Secondary | ICD-10-CM | POA: Insufficient documentation

## 2015-08-06 DIAGNOSIS — N898 Other specified noninflammatory disorders of vagina: Secondary | ICD-10-CM | POA: Diagnosis not present

## 2015-08-06 DIAGNOSIS — N63 Unspecified lump in unspecified breast: Secondary | ICD-10-CM

## 2015-08-06 HISTORY — DX: Candidiasis, unspecified: B37.9

## 2015-08-06 HISTORY — DX: Mastodynia: N64.4

## 2015-08-06 HISTORY — DX: Unspecified lump in unspecified breast: N63.0

## 2015-08-06 HISTORY — DX: Other specified noninflammatory disorders of vagina: N89.8

## 2015-08-06 LAB — POCT WET PREP (WET MOUNT): WBC, Wet Prep HPF POC: POSITIVE

## 2015-08-06 MED ORDER — FLUCONAZOLE 150 MG PO TABS: ORAL_TABLET | ORAL | Status: DC

## 2015-08-06 NOTE — Progress Notes (Signed)
Subjective:     Patient ID: Cindy Stevenson, female   DOB: 01-06-1976, 39 y.o.   MRN: [id number]  HPI Cindy Stevenson is a 39 year old white female in complaining of pain an left breast with ?knot and vaginal discharge, she has been on PCN for tooth abscess and gets GB US in am.  Review of Systems + vaginal discharge, + breast pain on left, ?knot, all other systems negative Reviewed past medical,surgical, social and family history. Reviewed medications and allergies.     Objective:   Physical Exam BP 130/90 mmHg  Pulse 90  Ht 5\' 1"  (1.549 m)  Wt 228 lb (103.42 kg)  BMI 43.10 kg/m2  LMP 07/28/2015 Skin warm and dry.Pelvic: external genitalia is normal in appearance no lesions, vagina: white discharge without odor,urethra has no lesions or masses noted, cervix:smooth and bulbous, uterus: normal size, shape and contour, non tender, no masses felt, adnexa: no masses or tenderness noted. Bladder is non tender and no masses felt. Wet prep: + for yeast and +WBCs.  Breasts:no dominate palpable mass, retraction or nipple discharge on right, on left no dominate mass or nipple discharge but has 2 cm round tender nodule at 7 o'clock 4 FB from nipple.    Assessment:    Left breast pain Left breast nodule Vaginal discharge Yeast infection    Plan:      Bilateral diagnostic mammogram and left breast US 11/1 at 11 am at [place] 150 mg #2 take 1 now and 1 in 3 days with 1 refill, do not take celexa when takes diflucan Follow up prn

## 2015-08-06 NOTE — Patient Instructions (Signed)
Get mammogram 11/1 at 11 am at [place] but when you do dont take celexa that day Follow up prn

## 2015-08-07 ENCOUNTER — Telehealth: Payer: Self-pay | Admitting: Adult Health

## 2015-08-07 MED ORDER — IBUPROFEN 800 MG PO TABS: 800.0000 mg | ORAL_TABLET | Freq: Three times a day (TID) | ORAL | Status: DC | PRN

## 2015-08-07 NOTE — Telephone Encounter (Signed)
Will refill motrin

## 2015-08-18 ENCOUNTER — Ambulatory Visit (HOSPITAL_COMMUNITY)
Admission: RE | Admit: 2015-08-18 | Discharge: 2015-08-18 | Disposition: A | Discharge status: Home / Self Care | Payer: Medicaid Other | Source: Ambulatory Visit | Attending: Adult Health | Admitting: Adult Health

## 2015-08-18 ENCOUNTER — Other Ambulatory Visit (HOSPITAL_COMMUNITY): Payer: Self-pay

## 2015-08-18 ENCOUNTER — Other Ambulatory Visit: Payer: Self-pay | Admitting: Adult Health

## 2015-08-18 DIAGNOSIS — N644 Mastodynia: Secondary | ICD-10-CM | POA: Insufficient documentation

## 2015-08-18 DIAGNOSIS — N63 Unspecified lump in unspecified breast: Secondary | ICD-10-CM

## 2015-08-18 DIAGNOSIS — N6310 Unspecified lump in the right breast, unspecified quadrant: Secondary | ICD-10-CM

## 2015-08-25 ENCOUNTER — Ambulatory Visit (HOSPITAL_COMMUNITY)
Admission: RE | Admit: 2015-08-25 | Discharge: 2015-08-25 | Disposition: A | Discharge status: Home / Self Care | Payer: Medicaid Other | Source: Ambulatory Visit | Attending: Adult Health | Admitting: Adult Health

## 2015-08-25 ENCOUNTER — Encounter (HOSPITAL_COMMUNITY): Payer: Self-pay

## 2015-08-25 ENCOUNTER — Other Ambulatory Visit: Payer: Self-pay | Admitting: Adult Health

## 2015-08-25 VITALS — BP 158/83 | HR 74 | Temp 97.9°F | Resp 16

## 2015-08-25 DIAGNOSIS — N6310 Unspecified lump in the right breast, unspecified quadrant: Secondary | ICD-10-CM

## 2015-08-25 DIAGNOSIS — N63 Unspecified lump in unspecified breast: Secondary | ICD-10-CM

## 2015-08-25 DIAGNOSIS — D241 Benign neoplasm of right breast: Secondary | ICD-10-CM | POA: Insufficient documentation

## 2015-08-25 MED ORDER — LIDOCAINE HCL (PF) 2 % IJ SOLN
INTRAMUSCULAR | Status: AC
Start: 2015-08-25 — End: 2015-08-25
  Filled 2015-08-25: qty 10

## 2015-08-25 NOTE — Discharge Instructions (Signed)
Breast Biopsy, Care After °These instructions give you information on caring for yourself after your procedure. Your doctor may also give you more specific instructions. Call your doctor if you have any problems or questions after your procedure. °HOME CARE °· Only take medicine as told by your doctor. °· Do not take aspirin. °· Keep your sutures (stitches) dry when bathing. °· Protect the biopsy area. Do not let the area get bumped. °· Avoid activities that could pull the biopsy site open until your doctor approves. This includes: °· Stretching. °· Reaching. °· Exercise. °· Sports. °· Lifting more than 3lb. °· Continue your normal diet. °· Wear a good support bra for as long as told by your doctor. °· Change any bandages (dressings) as told by your doctor. °· Do not drink alcohol while taking pain medicine. °· Keep all doctor visits as told. Ask when your test results will be ready. Make sure you get your test results. °GET HELP RIGHT AWAY IF:  °· You have a fever. °· You have more bleeding (more than a small spot) from the biopsy site. °· You have trouble breathing. °· You have yellowish-white fluid (pus) coming from the biopsy site. °· You have redness, puffiness (swelling), or more pain in the biopsy site. °· You have a bad smell coming from the biopsy site. °· Your biopsy site opens after sutures, staples, or sticky strips have been removed. °· You have a rash. °· You need stronger medicine. °MAKE SURE YOU: °· Understand these instructions. °· Will watch your condition. °· Will get help right away if you are not doing well or get worse. °  °This information is not intended to replace advice given to you by your health care provider. Make sure you discuss any questions you have with your health care provider. °  °Document Released: 07/30/2009 Document Revised: 10/24/2014 Document Reviewed: 11/13/2011 °Elsevier Interactive Patient Education ©2016 [employer] ° °Breast Biopsy °A breast biopsy is a test during  which a sample of tissue is taken from your breast. The breast tissue is looked at under a microscope for cancer cells.  °BEFORE THE PROCEDURE °· Make plans to have someone drive you home after the test. °· Do not smoke for 2 weeks before the test. Stop smoking, if you smoke. °· Do not drink alcohol for 24 hours before the test. °· Wear a good support bra to the test. °· Your doctor may do a procedure to place a wire or a seed that gives off radiation in the breast lump. The wire or seed will help your doctor see the lump when doing the biopsy. °PROCEDURE  °You may be given one of the following: °· A medicine to numb the breast area (local anesthetic). °· A medicine to make you fall asleep (general anesthetic). °There are different types of breast biopsies. They include: °· Fine-needle aspiration. °¨ A needle is put into the breast lump. °¨ The needle takes out fluid and cells from the lump. °¨ Ultrasound imaging may be used to help find the lump and to put the needle in the right spot. °· Core-needle biopsy. °¨ A needle is put into the breast lump. °¨ The needle is put in your breast 3-6 times. °¨ The needle removes breast tissue. °¨ An ultrasound image or X-ray is often used to find the right spot to put in the needle. °· Stereotactic biopsy. °¨ X-rays and a computer are used to study X-ray pictures of the breast lump. °¨ The computer finds   where the needle needs to be put into the breast. °¨ Tissue samples are taken out. °· Vacuum-assisted biopsy. °¨ A small cut (incision) is made in your breast. °¨ A biopsy device is put through the cut and into the breast tissue. °¨ The biopsy device draws abnormal breast tissue into the biopsy device. °¨ A large tissue sample is often removed. °¨ No stitches are needed. °· Ultrasound-guided core-needle biopsy. °¨ Ultrasound imaging helps guide the needle into the area of the breast that is not normal. °¨ A cut is made in the breast. The needle is put into the breast  lump. °¨ Tissue samples are taken out. °· Open biopsy. °¨ A large cut is made in the breast. °¨ Your doctor will try to remove the whole breast lump or as much as possible. °All tissue, fluid, or cell samples are looked at under a microscope.  °AFTER THE PROCEDURE °· You will be taken to an area to recover. You will be able to go home once you are doing well and are without problems. °· You may have bruising on your breast. This is normal. °· A pressure bandage (dressing) may be put on your breast for 24-48 hours. This type of bandage is wrapped tightly around your chest. It helps stop fluid from building up underneath tissues. °  °This information is not intended to replace advice given to you by your health care provider. Make sure you discuss any questions you have with your health care provider. °  °Document Released: 12/26/2011 Document Revised: 06/24/2015 Document Reviewed: 12/26/2011 °Elsevier Interactive Patient Education ©2016 Elsevier Inc. ° °

## 2015-08-26 ENCOUNTER — Encounter: Payer: Self-pay | Admitting: Adult Health

## 2015-08-26 DIAGNOSIS — D241 Benign neoplasm of right breast: Secondary | ICD-10-CM

## 2015-08-26 HISTORY — DX: Benign neoplasm of right breast: D24.1

## 2015-09-15 ENCOUNTER — Other Ambulatory Visit: Payer: Self-pay | Admitting: Adult Health

## 2015-10-18 HISTORY — PX: ESOPHAGOGASTRODUODENOSCOPY: SHX1529

## 2016-01-18 ENCOUNTER — Telehealth: Payer: Self-pay | Admitting: Adult Health

## 2016-01-18 MED ORDER — HYDROCHLOROTHIAZIDE 25 MG PO TABS: 25.0000 mg | ORAL_TABLET | Freq: Every day | ORAL | Status: DC

## 2016-01-18 NOTE — Telephone Encounter (Signed)
Will refill HCTZ x 3

## 2016-01-18 NOTE — Telephone Encounter (Signed)
Spoke with pt. Pt needs a refill on HCTZ 25mg  1 daily. Thanks!! [place]

## 2016-02-11 ENCOUNTER — Other Ambulatory Visit: Payer: Self-pay | Admitting: *Deleted

## 2016-02-12 MED ORDER — NORETHIN ACE-ETH ESTRAD-FE 1-20 MG-MCG PO TABS: 1.0000 | ORAL_TABLET | Freq: Every day | ORAL | Status: DC

## 2016-02-23 ENCOUNTER — Ambulatory Visit: Payer: Medicaid Other | Admitting: Gastroenterology

## 2016-03-10 ENCOUNTER — Encounter: Payer: Self-pay | Admitting: Nurse Practitioner

## 2016-03-10 ENCOUNTER — Telehealth: Payer: Self-pay | Admitting: Nurse Practitioner

## 2016-03-10 ENCOUNTER — Ambulatory Visit: Payer: Medicaid Other | Admitting: Nurse Practitioner

## 2016-03-10 NOTE — Telephone Encounter (Signed)
Noted  

## 2016-03-10 NOTE — Telephone Encounter (Signed)
PATIENT WAS A NO SHOW AND LETTER SENT  °

## 2016-04-01 ENCOUNTER — Other Ambulatory Visit: Payer: Self-pay | Admitting: Adult Health

## 2016-04-04 ENCOUNTER — Encounter: Payer: Self-pay | Admitting: Nurse Practitioner

## 2016-05-09 ENCOUNTER — Encounter: Payer: Self-pay | Admitting: Gastroenterology

## 2016-05-09 ENCOUNTER — Encounter: Payer: Self-pay | Admitting: Nurse Practitioner

## 2016-05-09 ENCOUNTER — Ambulatory Visit (INDEPENDENT_AMBULATORY_CARE_PROVIDER_SITE_OTHER): Payer: Medicaid Other | Admitting: Nurse Practitioner

## 2016-05-09 VITALS — BP 116/77 | HR 88 | Temp 97.8°F | Ht 61.0 in | Wt 236.8 lb

## 2016-05-09 DIAGNOSIS — K589 Irritable bowel syndrome without diarrhea: Secondary | ICD-10-CM | POA: Insufficient documentation

## 2016-05-09 DIAGNOSIS — R1084 Generalized abdominal pain: Secondary | ICD-10-CM

## 2016-05-09 DIAGNOSIS — K219 Gastro-esophageal reflux disease without esophagitis: Secondary | ICD-10-CM | POA: Diagnosis not present

## 2016-05-09 DIAGNOSIS — K580 Irritable bowel syndrome with diarrhea: Secondary | ICD-10-CM

## 2016-05-09 MED ORDER — DICYCLOMINE HCL 10 MG PO CAPS: 10.0000 mg | ORAL_CAPSULE | Freq: Three times a day (TID) | ORAL | 1 refills | Status: DC

## 2016-05-09 NOTE — Progress Notes (Signed)
Referring Provider: [facility] Primary Care Physician:  Tawni Carnes, PA-C Primary GI:  Dr. Oneida Alar  Chief Complaint  Patient presents with  . Abdominal Pain    HPI:   Cindy Stevenson is a 40 y.o. female who presents for abdominal pain. She was last seen in our office on 09/06/2013 for abdominal pain and rectal bleeding. At that time she noted epigastric pain radiating to LUQ/RUQ for the previous week as well as epigastric pressure. Pain was improving at that time, admitted underlying nausea but no vomiting. Had tried bland foods. Occasional NSAIDs, no ASA powders. History of severe reflux started also 20 mg twice a day within the previous month. Noted gallbladder remains in situ with HIDA scan in August 2012 with gallbladder EF 91% although symptomatic with CCK infusion. Labs and CT unimpressive/benign. It was decided that time to obtain abdominal ultrasound consider updated HIDA, proceed with colonoscopy with possible endoscopy at that time. Start Linzess 145 g daily, start Protonix daily.  Ultrasound completed 09/09/2013 which found hepatic steatosis, no gallstones, gallbladder wall thickening, or pericholecystic fluid; negative sonographic Murphy sign.  Colonoscopy and endoscopy completed 09/10/2013. Endoscopy found normal mucosa in the terminal ileum, normal colon, 2 colon polyps that were sessile and measuring 2-3 mm in the rectum, small internal hemorrhoids. Surgical pathology found the polyps to be hyperplastic. Endoscopy completed at the same time found Schatzki's ring widely patent, mild nonerosive gastritis status post biopsy. Surgical pathology found deep biopsies to be mild gastritis with intestinal metaplasia, no H. pylori. Recommendations included continue Protonix, Zantac as needed, avoid triggers, continue weight loss efforts, follow a high-fiber low-fat diet, next colonoscopy at age 41. Follow-up in 4 months. She was a no-show for her prior two follow-up  appointments.  Colonoscopy up to date next due age 78.  Today she states she changed PCPs recently. Was referred to GI and saw Dr. Britta Mccreedy because they could see her before we could. He did an EGD and told her she has a hiatal hernia, IBS, and "nervous stomach." She states he didn't do anything. She did not care for her visit and wants a second opinion. Currently having abdominal pain at [facility] June 2017 for epigastric pain radiating to bilateral sides. Send for U/S and HIDA scan and CT scan. Was told the imaging did not indicate gallbladder disease. Currently her abdominal pain is intermittent usually, occasionally lasts all day. Typically starts after eating, worse with fatty foods. Has a bowel movement with every meal and points to 4-7 on the bristol scale. Worse stools with anxiety. Up to 5 stools a day. Pain occasionally improves after a bowel movement. Denies hematochezia, melena, fever, chills, unintentional weight loss. Currently on Nexium which controls her symptoms adequately. Takes ibuprofen when she has cramps or headache, typically 1-2 times a month. Denies ASA powders except once 2 weeks ago.   Past Medical History:  Diagnosis Date  . Anxiety   . Breast nodule 08/06/2015  . Breast pain, left 08/06/2015  . Burning with urination 02/27/2014  . Depression   . Dysmenorrhea 02/27/2014  . 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 Hematuria 02/27/2014  . Hypertension   . PTSD (post-traumatic stress disorder)   . Screening for STD (sexually transmitted disease) 03/27/2014  . Seasonal allergies   . SUI (stress urinary incontinence, female) 02/27/2014  . Vaginal discharge 08/06/2015  . Warts, genital 01/23/2015  . Yeast infection 08/06/2015    Past Surgical History:  Procedure Laterality Date  . COLONOSCOPY  N/A 09/10/2013   [id number] internal hemorrhoids  . ESOPHAGOGASTRODUODENOSCOPY N/A 09/10/2013   [id number] ring at the gastroesophagral juctions/mild non-erosive gastritis  .  LABIOPLASTY  02/14/2012   Procedure: LABIAPLASTY;  Surgeon: Jonnie Kind, MD;  Location: [facility];  Service: Gynecology;  Laterality: Right;  of the right labia minora  . WISDOM TOOTH EXTRACTION  1997   Dr. Owens Shark    Current Outpatient Prescriptions  Medication Sig Dispense Refill  . ALPRAZolam (XANAX) 0.5 MG tablet Take 0.5 mg by mouth 3 (three) times daily as needed for anxiety.    . DULoxetine (CYMBALTA) 30 MG capsule Take 30 mg by mouth 2 (two) times daily.    Marland Kitchen esomeprazole (NEXIUM) 40 MG capsule Take 40 mg by mouth daily at 12 noon.    . gabapentin (NEURONTIN) 300 MG capsule 300 mg 3 (three) times daily.   1  . hydrochlorothiazide (HYDRODIURIL) 25 MG tablet Take 1 tablet (25 mg total) by mouth daily. 30 tablet 3  . ibuprofen (ADVIL,MOTRIN) 800 MG tablet take 1 tablet by mouth every 8 hours if needed for pain 60 tablet 1  . metoprolol tartrate (LOPRESSOR) 25 MG tablet Take 25 mg by mouth 2 (two) times daily. Takes 1/2 tab;et bid     No current facility-administered medications for this visit.     Allergies as of 05/09/2016 - Review Complete 05/09/2016  Allergen Reaction Noted  . Latex  05/09/2016  . Codeine  02/13/2012  . Shellfish allergy  02/13/2012  . Amoxicillin Rash 01/23/2015    Family History  Problem Relation Age of Onset  . Lung cancer Mother   . Asthma Sister   . Allergies Sister   . Mental illness Maternal Aunt     depression, PTSD  . Diabetes Maternal Grandmother   . Cancer Maternal Grandfather     skin  . Colon cancer Neg Hx     Social History   Social History  . Marital status: Single    Spouse name: N/A  . Number of children: N/A  . Years of education: N/A   Occupational History  . disability Not Employed   Social History Main Topics  . Smoking status: Never Smoker  . Smokeless tobacco: Never Used  . Alcohol use No  . Drug use: No  . Sexual activity: Not Currently    Birth control/ protection: Pill   Other Topics Concern  . Not on file    Social History Narrative  . No narrative on file    Review of Systems: 10-point ROS negative except as per HPI.   Physical Exam: BP 116/77   Pulse 88   Temp 97.8 F (36.6 C) (Oral)   Ht 5\' 1"  (1.549 m)   Wt 236 lb 12.8 oz (107.4 kg)   LMP 05/02/2016 (Approximate)   BMI 44.74 kg/m  General:   Obese female alert and oriented. Pleasant and cooperative. Well-nourished and well-developed.  Eyes:  Without icterus, sclera clear and conjunctiva pink.  Ears:  Normal auditory acuity. Cardiovascular:  S1, S2 present without murmurs appreciated. Extremities without clubbing or edema. Respiratory:  Clear to auscultation bilaterally. No wheezes, rales, or rhonchi. No distress.  Gastrointestinal:  +BS, obese but soft, and non-distended. Abdominal TTP noted epihastric and RUQ. No HSM noted. No guarding or rebound. No masses appreciated.  Rectal:  Deferred  Musculoskalatal:  Symmetrical without gross deformities. Neurologic:  Alert and oriented x4;  grossly normal neurologically. Psych:  Alert and cooperative. Normal mood and affect. Heme/Lymph/Immune: No significant cervical adenopathy.  No excessive bruising noted.    05/09/2016 12:15 PM   Disclaimer: This note was dictated with voice recognition software. Similar sounding words can inadvertently be transcribed and may not be corrected upon review.

## 2016-05-09 NOTE — Assessment & Plan Note (Signed)
Patient presentation consistent with irritable bowel syndrome diarrhea type. 5 loose stools to normal stools a day, abdominal pain which improves with bowel movement. At this point I'll start her on Bentyl 10 mg 3 times a day and at night as needed for abdominal cramping and diarrhea. Return for follow-up in 2 months for symptomatic evaluation.

## 2016-05-09 NOTE — Progress Notes (Signed)
CC'D TO PCP °

## 2016-05-09 NOTE — Patient Instructions (Addendum)
1. We will request your records from [facility] every other evening as needed for GERD symptoms not well controlled by her Nexium. 3. I've sent in a prescription for Bentyl 10 mg to your pharmacy. Take it 3 times a day and at bedtime as needed for abdominal cramping and diarrhea. 4. Return for follow-up in 2 months.    Fat and Cholesterol Restricted Diet High levels of fat and cholesterol in your blood may lead to various health problems, such as diseases of the heart, blood vessels, gallbladder, liver, and pancreas. Fats are concentrated sources of energy that come in various forms. Certain types of fat, including saturated fat, may be harmful in excess. Cholesterol is a substance needed by your body in small amounts. Your body makes all the cholesterol it needs. Excess cholesterol comes from the food you eat. When you have high levels of cholesterol and saturated fat in your blood, health problems can develop because the excess fat and cholesterol will gather along the walls of your blood vessels, causing them to narrow. Choosing the right foods will help you control your intake of fat and cholesterol. This will help keep the levels of these substances in your blood within normal limits and reduce your risk of disease. WHAT IS MY PLAN? Your health care provider recommends that you:  Get no more than __________ % of the total calories in your daily diet from fat.  Limit your intake of saturated fat to less than ______% of your total calories each day.  Limit the amount of cholesterol in your diet to less than _________mg per day. WHAT TYPES OF FAT SHOULD I CHOOSE?  Choose healthy fats more often. Choose monounsaturated and polyunsaturated fats, such as olive and canola oil, flaxseeds, walnuts, almonds, and seeds.  Eat more omega-3 fats. Good choices include salmon, mackerel, sardines, tuna, flaxseed oil, and ground flaxseeds. Aim to eat fish at least two times a  week.  Limit saturated fats. Saturated fats are primarily found in animal products, such as meats, butter, and cream. Plant sources of saturated fats include palm oil, palm kernel oil, and coconut oil.  Avoid foods with partially hydrogenated oils in them. These contain trans fats. Examples of foods that contain trans fats are stick margarine, some tub margarines, cookies, crackers, and other baked goods. WHAT GENERAL GUIDELINES DO I NEED TO FOLLOW? These guidelines for healthy eating will help you control your intake of fat and cholesterol:  Check food labels carefully to identify foods with trans fats or high amounts of saturated fat.  Fill one half of your plate with vegetables and green salads.  Fill one fourth of your plate with whole grains. Look for the word "whole" as the first word in the ingredient list.  Fill one fourth of your plate with lean protein foods.  Limit fruit to two servings a day. Choose fruit instead of juice.  Eat more foods that contain soluble fiber. Examples of foods that contain this type of fiber are apples, broccoli, carrots, beans, peas, and barley. Aim to get 20-30 g of fiber per day.  Eat more home-cooked food and less restaurant, buffet, and fast food.  Limit or avoid alcohol.  Limit foods high in starch and sugar.  Limit fried foods.  Cook foods using methods other than frying. Baking, boiling, grilling, and broiling are all great options.  Lose weight if you are overweight. Losing just 5-10% of your initial body weight can help your overall health and prevent  diseases such as diabetes and heart disease. WHAT FOODS CAN I EAT? Grains Whole grains, such as whole wheat or whole grain breads, crackers, cereals, and pasta. Unsweetened oatmeal, bulgur, barley, quinoa, or Agostini rice. Corn or whole wheat flour tortillas. Vegetables Fresh or frozen vegetables (raw, steamed, roasted, or grilled). Green salads. Fruits All fresh, canned (in natural  juice), or frozen fruits. Meat and Other Protein Products Ground beef (85% or leaner), grass-fed beef, or beef trimmed of fat. Skinless chicken or [place]. Ground chicken or [place]. Pork trimmed of fat. All fish and seafood. Eggs. Dried beans, peas, or lentils. Unsalted nuts or seeds. Unsalted canned or dry beans. Dairy Low-fat dairy products, such as skim or 1% milk, 2% or reduced-fat cheeses, low-fat ricotta or cottage cheese, or plain low-fat yogurt. Fats and Oils Tub margarines without trans fats. Light or reduced-fat mayonnaise and salad dressings. Avocado. Olive, canola, sesame, or safflower oils. Natural peanut or almond butter (choose ones without added sugar and oil). The items listed above may not be a complete list of recommended foods or beverages. Contact your dietitian for more options. WHAT FOODS ARE NOT RECOMMENDED? Grains White bread. White pasta. White rice. Cornbread. Bagels, pastries, and croissants. Crackers that contain trans fat. Vegetables White potatoes. Corn. Creamed or fried vegetables. Vegetables in a cheese sauce. Fruits Dried fruits. Canned fruit in light or heavy syrup. Fruit juice. Meat and Other Protein Products Fatty cuts of meat. Ribs, chicken wings, bacon, sausage, bologna, salami, chitterlings, fatback, hot dogs, bratwurst, and packaged luncheon meats. Liver and organ meats. Dairy Whole or 2% milk, cream, half-and-half, and cream cheese. Whole milk cheeses. Whole-fat or sweetened yogurt. Full-fat cheeses. Nondairy creamers and whipped toppings. Processed cheese, cheese spreads, or cheese curds. Sweets and Desserts Corn syrup, sugars, honey, and molasses. Candy. Jam and jelly. Syrup. Sweetened cereals. Cookies, pies, cakes, donuts, muffins, and ice cream. Fats and Oils Butter, stick margarine, lard, shortening, ghee, or bacon fat. Coconut, palm kernel, or palm oils. Beverages Alcohol. Sweetened drinks (such as sodas, lemonade, and fruit drinks or  punches). The items listed above may not be a complete list of foods and beverages to avoid. Contact your dietitian for more information.   This information is not intended to replace advice given to you by your health care provider. Make sure you discuss any questions you have with your health care provider.   Document Released: 10/03/2005 Document Revised: 10/24/2014 Document Reviewed: 01/01/2014 Elsevier Interactive Patient Education [employer].

## 2016-05-09 NOTE — Assessment & Plan Note (Signed)
Patient with abdominal pain in multiple locations including epigastric, right upper quadrant, left upper quadrant. Her GERD is relatively well managed with persistent symptoms. She has been bouncing back and forth between the [facility] system in our system. She is recently seen in the emergency department at which point they didn't ultrasound, HIDA scan, CT, and upper endoscopy. I will request these reports. Her presentation seems consistent with GERD and/or irritable bowel syndrome. Recommendations per above. If she persists with abdominal pain despite adequate treatment can consider referral to surgery for elective cholecystectomy. Return for follow-up in 2 months.

## 2016-05-09 NOTE — Assessment & Plan Note (Addendum)
GERD well-controlled on Nexium until last week. I will add Zantac every other evening as needed. Explained that works best not taken every day.. Continue medications, return for follow-up in 2 months.  Plan low-fat diet, weight loss efforts. We'll provide information on diet.

## 2016-07-11 ENCOUNTER — Encounter: Payer: Self-pay | Admitting: Nurse Practitioner

## 2016-07-12 ENCOUNTER — Ambulatory Visit: Payer: Medicaid Other | Admitting: Nurse Practitioner

## 2016-08-01 ENCOUNTER — Ambulatory Visit: Payer: Medicaid Other | Admitting: Nurse Practitioner

## 2017-01-06 ENCOUNTER — Telehealth: Payer: Self-pay | Admitting: Adult Health

## 2017-01-06 MED ORDER — FLUCONAZOLE 150 MG PO TABS: ORAL_TABLET | ORAL | 1 refills | Status: DC

## 2017-01-06 NOTE — Telephone Encounter (Signed)
Pt requesting diflucan for yeast after antibiotics, will send diflucan rx to [facility] in [place]

## 2017-03-09 ENCOUNTER — Ambulatory Visit (INDEPENDENT_AMBULATORY_CARE_PROVIDER_SITE_OTHER): Payer: Medicaid Other | Admitting: Adult Health

## 2017-03-09 ENCOUNTER — Other Ambulatory Visit (HOSPITAL_COMMUNITY)
Admission: RE | Admit: 2017-03-09 | Discharge: 2017-03-09 | Disposition: A | Discharge status: Home / Self Care | Payer: Medicaid Other | Source: Ambulatory Visit | Attending: Adult Health | Admitting: Adult Health

## 2017-03-09 ENCOUNTER — Encounter: Payer: Self-pay | Admitting: Adult Health

## 2017-03-09 VITALS — BP 130/98 | HR 72 | Ht 62.0 in | Wt 228.0 lb

## 2017-03-09 DIAGNOSIS — N644 Mastodynia: Secondary | ICD-10-CM

## 2017-03-09 DIAGNOSIS — R102 Pelvic and perineal pain: Secondary | ICD-10-CM

## 2017-03-09 DIAGNOSIS — Z0000 Encounter for general adult medical examination without abnormal findings: Secondary | ICD-10-CM | POA: Diagnosis not present

## 2017-03-09 DIAGNOSIS — Z01419 Encounter for gynecological examination (general) (routine) without abnormal findings: Secondary | ICD-10-CM | POA: Insufficient documentation

## 2017-03-09 DIAGNOSIS — F32A Depression, unspecified: Secondary | ICD-10-CM | POA: Insufficient documentation

## 2017-03-09 DIAGNOSIS — N939 Abnormal uterine and vaginal bleeding, unspecified: Secondary | ICD-10-CM | POA: Insufficient documentation

## 2017-03-09 DIAGNOSIS — F329 Major depressive disorder, single episode, unspecified: Secondary | ICD-10-CM

## 2017-03-09 NOTE — Addendum Note (Signed)
Addended by: Diona Fanti A on: 03/09/2017 03:03 PM   Modules accepted: Orders

## 2017-03-09 NOTE — Progress Notes (Signed)
Patient ID: Cindy Stevenson, female   DOB: November 21, 1975, 41 y.o.   MRN: [id number] History of Present Illness: Cindy Stevenson is a 41 year old white female, in for well woman gyn exam and pap.She is complaining of abnormal bleeding and pain with period and other times.She says she is falling apart.  PCP is Jerre Simon.   Current Medications, Allergies, Past Medical History, Past Surgical History, Family History and Social History were reviewed in [employer] record.     Review of Systems: Patient denies any headaches, hearing loss, fatigue, blurred vision, shortness of breath, chest pain, problems with bowel movements, urination, or intercourse. No joint pain or mood swings.See HPI for positives.     Physical Exam:BP (!) 130/98 (BP Location: Right Arm, Patient Position: Sitting, Cuff Size: Large)   Pulse 72   Ht 5\' 2"  (1.575 m)   Wt 228 lb (103.4 kg)   LMP 02/17/2017   BMI 41.70 kg/m  General:  Well developed, well nourished, no acute distress,but anxious Skin:  Warm and dry Neck:  Midline trachea, normal thyroid, good ROM, no lymphadenopathy Lungs; Clear to auscultation bilaterally Breast:  No dominant palpable mass, retraction, or nipple discharge, has pain in left breast at 10 o'clock and fibroglandular tissue Cardiovascular: Regular rate and rhythm Abdomen:  Soft, non tender, no hepatosplenomegaly Pelvic:  External genitalia is normal in appearance, no lesions.  The vagina is normal in appearance. Urethra has no lesions or masses. The cervix is bulbous.Pap with HPV and GC/CHL performed.  Uterus is felt to be normal size, shape, and contour, mildly tender.  No adnexal masses or tenderness noted.Bladder is non tender, no masses felt.Has rectal skin tag vs hemorrhoidal remnant  Extremities/musculoskeletal:  No swelling or varicosities noted, no clubbing or cyanosis Psych:  No mood changes, alert and cooperative,seems happy PHQ 9 score, 8, she sees [facility] and is on  meds, denies being suicidal.  Will get [place] to assess uterus and schedule mammogram and breast US, if needed. Impression:  1. Encounter for routine gynecological examination with Papanicolaou smear of cervix   2. Abnormal uterine bleeding (AUB)   3. Breast pain, left   4. Pelvic pain   5. Depression, unspecified depression type      Plan: Orders Placed This Encounter  Procedures  . US BREAST LTD UNI RIGHT INC AXILLA    Standing Status:   Future    Standing Expiration Date:   05/07/2018    Order Specific Question:   Reason for Exam (SYMPTOM  OR DIAGNOSIS REQUIRED)    Answer:   left pain    Order Specific Question:   Preferred imaging location?    Answer:   Internal  . MM DIAG BREAST TOMO BILATERAL    Standing Status:   Future    Standing Expiration Date:   05/07/2018    Order Specific Question:   Reason for Exam (SYMPTOM  OR DIAGNOSIS REQUIRED)    Answer:   pain in left breast    Order Specific Question:   Is the patient pregnant?    Answer:   No    Order Specific Question:   Preferred imaging location?    Answer:   [facility]  . US BREAST LTD UNI LEFT INC AXILLA    Standing Status:   Future    Standing Expiration Date:   05/07/2018    Order Specific Question:   Reason for Exam (SYMPTOM  OR DIAGNOSIS REQUIRED)    Answer:   left  pain    Order Specific Question:   Preferred imaging location?    Answer:   Internal  . US Pelvis Complete    Standing Status:   Future    Standing Expiration Date:   05/09/2018    Order Specific Question:   Reason for exam:    Answer:   AUB pain in low abd    Order Specific Question:   Preferred imaging location?    Answer:   Internal  . US Transvaginal Non-OB    Standing Status:   Future    Standing Expiration Date:   05/09/2018    Order Specific Question:   Reason for exam:    Answer:   AUB    Order Specific Question:   Preferred imaging location?    Answer:   Internal  Return in 1 week for GYN US,then see me the next week to discuss  options Physical in 1 year, pap in 3 if normal Labs with PCP Use preparation H

## 2017-03-10 ENCOUNTER — Ambulatory Visit (INDEPENDENT_AMBULATORY_CARE_PROVIDER_SITE_OTHER): Payer: Medicaid Other

## 2017-03-10 DIAGNOSIS — N939 Abnormal uterine and vaginal bleeding, unspecified: Secondary | ICD-10-CM | POA: Diagnosis not present

## 2017-03-10 NOTE — Progress Notes (Signed)
PELVIC US TA/TV: homogeneous anteverted uterus,wnl,EEC 7.8 mm,normal ovaries bilat,right ovary appears mobile,left ovary appears to be adhered to uterine fundus,left adnexal pain,no free fluid

## 2017-03-14 LAB — CYTOLOGY - PAP
Adequacy: ABSENT
CHLAMYDIA, DNA PROBE: NEGATIVE
Diagnosis: NEGATIVE
HPV: NOT DETECTED
NEISSERIA GONORRHEA: NEGATIVE

## 2017-03-16 ENCOUNTER — Other Ambulatory Visit: Payer: Medicaid Other

## 2017-03-21 ENCOUNTER — Ambulatory Visit (HOSPITAL_COMMUNITY)
Admission: RE | Admit: 2017-03-21 | Discharge: 2017-03-21 | Disposition: A | Discharge status: Home / Self Care | Payer: Medicaid Other | Source: Ambulatory Visit | Attending: Adult Health | Admitting: Adult Health

## 2017-03-21 DIAGNOSIS — N644 Mastodynia: Secondary | ICD-10-CM

## 2017-03-21 DIAGNOSIS — N6322 Unspecified lump in the left breast, upper inner quadrant: Secondary | ICD-10-CM | POA: Insufficient documentation

## 2017-03-24 ENCOUNTER — Ambulatory Visit (INDEPENDENT_AMBULATORY_CARE_PROVIDER_SITE_OTHER): Payer: Medicaid Other | Admitting: Adult Health

## 2017-03-24 ENCOUNTER — Encounter: Payer: Self-pay | Admitting: Adult Health

## 2017-03-24 VITALS — BP 118/68 | HR 102 | Ht 61.0 in | Wt 227.0 lb

## 2017-03-24 DIAGNOSIS — N939 Abnormal uterine and vaginal bleeding, unspecified: Secondary | ICD-10-CM | POA: Diagnosis not present

## 2017-03-24 DIAGNOSIS — N946 Dysmenorrhea, unspecified: Secondary | ICD-10-CM | POA: Diagnosis not present

## 2017-03-24 DIAGNOSIS — N941 Unspecified dyspareunia: Secondary | ICD-10-CM | POA: Diagnosis not present

## 2017-03-24 DIAGNOSIS — R102 Pelvic and perineal pain: Secondary | ICD-10-CM | POA: Diagnosis not present

## 2017-03-24 DIAGNOSIS — N736 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s (postinfective): Secondary | ICD-10-CM

## 2017-03-24 NOTE — Progress Notes (Signed)
Subjective:     Patient ID: Cindy Stevenson, female   DOB: 05-29-1976, 41 y.o.   MRN: [id number]  HPI Cindy Stevenson is a 41 year old white female in for follow up on pelvic pain, had [place] last week.  Review of Systems +pelvic pain Pain with sex Painful periods 2 periods a month at times     Objective:   Physical Exam BP 118/68 (BP Location: Left Arm, Patient Position: Sitting, Cuff Size: Large)   Pulse (!) 102   Ht 5\' 1"  (1.549 m)   Wt 227 lb (103 kg)   LMP 03/23/2017   BMI 42.89 kg/m    Talk only, reviewed [place]: has left ovary adherent to uterus. She says she feels this is punishment for termination she had years ago.She is teary  Today.Explained this can just happen, but will get appt with MD to discuss, could remove ovary, or ovary and uterus, but discuss with Dr Glo Herring.  Face time 15 minutes with counseling.   Assessment:     1. Pelvic pain   2. Pelvic adhesions   3. Dyspareunia in female   4. Dysmenorrhea   5. Abnormal uterine bleeding (AUB)       Plan:     Return in 1 week to talk surgical options with Dr Glo Herring   Mammogram at 67

## 2017-03-31 ENCOUNTER — Encounter: Payer: Self-pay | Admitting: Obstetrics and Gynecology

## 2017-03-31 ENCOUNTER — Ambulatory Visit (INDEPENDENT_AMBULATORY_CARE_PROVIDER_SITE_OTHER): Payer: Medicaid Other | Admitting: Obstetrics and Gynecology

## 2017-03-31 VITALS — BP 124/82 | HR 102 | Wt 224.0 lb

## 2017-03-31 DIAGNOSIS — N946 Dysmenorrhea, unspecified: Secondary | ICD-10-CM

## 2017-03-31 DIAGNOSIS — N736 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s (postinfective): Secondary | ICD-10-CM | POA: Diagnosis not present

## 2017-03-31 NOTE — Progress Notes (Signed)
[facility] Visit  03/31/2017         Patient name: Cindy Stevenson MRN [id number]  Date of birth: 01/22/1976  CC & HPI:  Cindy Stevenson is a 41 y.o. female presenting today for discussion of  surgical options. Pt has been complaining of persistent, worsening, pelvic pain. She reports associated dyspareunia. She was seen in office on 03/24/17 for [place] results. Her US showed left ovary adherent to uterus. She has a h/o lifelong dysmenorrhea beginning in teen years; better after receiving depo. Pt has no other acute complaints at this time.    ROS:  ROS Otherwise negative for acute change except as noted in the HPI.  Pertinent History Reviewed:   Reviewed: Significant for dysmenorrhea, breast pain, genital warts, and yeast infection  Medical         Past Medical History:  Diagnosis Date  . Anxiety   . Breast nodule 08/06/2015  . Breast pain, left 08/06/2015  . Burning with urination 02/27/2014  . Depression   . Diabetes mellitus without complication ([place])   . Dysmenorrhea 02/27/2014  . 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 Hematuria 02/27/2014  . Hypertension   . PTSD (post-traumatic stress disorder)   . Screening for STD (sexually transmitted disease) 03/27/2014  . Seasonal allergies   . SUI (stress urinary incontinence, female) 02/27/2014  . Vaginal discharge 08/06/2015  . Warts, genital 01/23/2015  . Yeast infection 08/06/2015                              Surgical Hx:    Past Surgical History:  Procedure Laterality Date  . COLONOSCOPY N/A 09/10/2013   ZYS:AYTKZ;/SWFUX internal hemorrhoids  . ESOPHAGOGASTRODUODENOSCOPY N/A 09/10/2013   NAT:[id number] ring at the gastroesophagral juctions/mild non-erosive gastritis  . LABIOPLASTY  02/14/2012   Procedure: LABIAPLASTY;  Surgeon: Jonnie Kind, MD;  Location: [facility];  Service: Gynecology;  Laterality: Right;  of the right labia minora  . WISDOM TOOTH EXTRACTION  1997   Dr. Owens Shark   Medications: Reviewed & Updated - see  associated section                       Current Outpatient Prescriptions:  .  diazepam (VALIUM) 5 MG tablet, Take 5 mg by mouth 2 (two) times daily., Disp: , Rfl:  .  DULoxetine (CYMBALTA) 30 MG capsule, Take 30 mg by mouth 2 (two) times daily., Disp: , Rfl:  .  esomeprazole (NEXIUM) 40 MG capsule, Take 40 mg by mouth daily at 12 noon., Disp: , Rfl:  .  gabapentin (NEURONTIN) 300 MG capsule, 300 mg 3 (three) times daily. , Disp: , Rfl: 1 .  lisinopril (PRINIVIL,ZESTRIL) 10 MG tablet, Take 10 mg by mouth daily., Disp: , Rfl:    Social History: Reviewed -  reports that she has never smoked. She has never used smokeless tobacco.  Objective Findings:  Vitals: Last menstrual period 03/23/2017.  Discussion only: Greater than 50% was spent in counseling and coordination of care with the patient. Discussed surgical options: lysis adhesion vs. laprascopic supracervical hysterectomy/left salpingo-oophorectomy. Also discussed pt's reproductive plans. Pt states she does not want to have children. Pt would like to have the hysterectomy.  Total time greater than: 25 minutes    Assessment & Plan:   A:  1. Severe dysmenorrhea  2. Left ovarian adhesions  P:  1. Discharge with information concerning hysterectomy, discussed  LSH, versus LSH +left S&O, versus open Supracervical hysterectomy with possible LSO 2. Return in 2 weeks for pre-op visit.   By signing my name below, I, Evelene Croon, attest that this documentation has been prepared under the direction and in the presence of Jonnie Kind, MD . Electronically Signed: Evelene Croon, Scribe. 03/31/2017. 1:48 PM. I personally performed the services described in this documentation, which was SCRIBED in my presence. The recorded information has been reviewed and considered accurate. It has been edited as necessary during review. Jonnie Kind, MD

## 2017-04-12 ENCOUNTER — Encounter: Payer: Self-pay | Admitting: Obstetrics and Gynecology

## 2017-04-12 ENCOUNTER — Ambulatory Visit (INDEPENDENT_AMBULATORY_CARE_PROVIDER_SITE_OTHER): Payer: Medicaid Other | Admitting: Obstetrics and Gynecology

## 2017-04-12 VITALS — BP 124/88 | HR 86 | Wt 225.8 lb

## 2017-04-12 DIAGNOSIS — R102 Pelvic and perineal pain: Secondary | ICD-10-CM

## 2017-04-12 DIAGNOSIS — N736 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s (postinfective): Secondary | ICD-10-CM | POA: Diagnosis not present

## 2017-04-12 NOTE — Progress Notes (Signed)
Preoperative History and Physical  Cindy Stevenson is a 41 y.o. G1P0010 here for surgical management of pelvic pain, Left sided predominance, where pt has sonographic evidence of an ovary with adherence..   No significant preoperative concerns. Pt fully aware that after hysterectomy , sterility is permanent  Proposed surgery: Abdominal supracervical hysterectomy, left salpingoophorectomy, right salpingectomy.  Past Medical History:  Diagnosis Date  . Anxiety   . Breast nodule 08/06/2015  . Breast pain, left 08/06/2015  . Burning with urination 02/27/2014  . Depression   . Diabetes mellitus without complication ([place])   . Dysmenorrhea 02/27/2014  . 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 Hematuria 02/27/2014  . Hypertension   . PTSD (post-traumatic stress disorder)   . Screening for STD (sexually transmitted disease) 03/27/2014  . Seasonal allergies   . SUI (stress urinary incontinence, female) 02/27/2014  . Vaginal discharge 08/06/2015  . Warts, genital 01/23/2015  . Yeast infection 08/06/2015   Past Surgical History:  Procedure Laterality Date  . COLONOSCOPY N/A 09/10/2013   NKN:LZJQB;/HALPF internal hemorrhoids  . ESOPHAGOGASTRODUODENOSCOPY N/A 09/10/2013   XTK:WIOXBDZH ring at the gastroesophagral juctions/mild non-erosive gastritis  . LABIOPLASTY  02/14/2012   Procedure: LABIAPLASTY;  Surgeon: Jonnie Kind, MD;  Location: [facility];  Service: Gynecology;  Laterality: Right;  of the right labia minora  . WISDOM TOOTH EXTRACTION  1997   Dr. Owens Shark   OB History  Gravida Para Term Preterm AB Living  1       1    SAB TAB Ectopic Multiple Live Births    1          # Outcome Date GA Lbr Len/2nd Weight Sex Delivery Anes PTL Lv  1 TAB             Patient denies any other pertinent gynecologic issues.   Current Outpatient Prescriptions on File Prior to Visit  Medication Sig Dispense Refill  . diazepam (VALIUM) 5 MG tablet Take 5 mg by mouth 2 (two) times daily.    . DULoxetine  (CYMBALTA) 30 MG capsule Take 30 mg by mouth 2 (two) times daily.    Marland Kitchen esomeprazole (NEXIUM) 40 MG capsule Take 40 mg by mouth daily at 12 noon.    . gabapentin (NEURONTIN) 300 MG capsule 300 mg 3 (three) times daily.   1  . lisinopril (PRINIVIL,ZESTRIL) 10 MG tablet Take 10 mg by mouth daily.    . rizatriptan (MAXALT) 10 MG tablet Take 10 mg by mouth as needed for migraine. May repeat in 2 hours if needed     No current facility-administered medications on file prior to visit.    Allergies  Allergen Reactions  . Latex   . Codeine     Vomiting  . Shellfish Allergy     Breaks out in Hives  . Amoxicillin Rash    Social History:   reports that she has never smoked. She has never used smokeless tobacco. She reports that she does not drink alcohol or use drugs.  Family History  Problem Relation Age of Onset  . Lung cancer Mother   . Asthma Sister   . Allergies Sister   . Mental illness Maternal Aunt        depression, PTSD  . Diabetes Maternal Grandmother   . Cancer Maternal Grandfather        skin  . Colon cancer Neg Hx     Review of Systems: Noncontributory  PHYSICAL EXAM: Blood pressure 124/88, pulse 86, weight 225 lb  12.8 oz (102.4 kg), last menstrual period 03/23/2017. General appearance - alert, well appearing, and in no distress Chest - clear to auscultation, no wheezes, rales or rhonchi, symmetric air entry Heart - normal rate and regular rhythm Abdomen - soft, nontender, nondistended, no masses or organomegaly                     Lax lower abdomen, with obesity and pannus. Pelvic - examination Efg normal  Vagina normal secretions. Uterus normal cervix. Vaguely tender nonpurulent uterus  Extremities - peripheral pulses normal, no pedal edema, no clubbing or cyanosis  Labs: No results found for this or any previous visit (from the past 336 hour(s)).  Imaging Studies: US Breast Ltd Uni Left Inc Axilla  Result Date: 03/21/2017 CLINICAL DATA:  41 year old patient  with recent pain in the upper inner quadrant of the left breast. She does not palpate a mass. She had ultrasound-guided biopsy of a right breast mass in 2016, demonstrating a benign fibroadenoma. A probably benign nodule was noted in the medial left breast on the prior mammogram of November 2016. EXAM: 2D DIGITAL DIAGNOSTIC BILATERAL MAMMOGRAM WITH CAD AND ADJUNCT TOMO ULTRASOUND LEFT BREAST COMPARISON:  Previous exam(s). ACR Breast Density Category b: There are scattered areas of fibroglandular density. FINDINGS: Metallic skin marker was placed in the region of recent patient pain. No suspicious findings are seen in this portion of the medial left breast on the standard or spot tangential images. A small circumscribed low-density nodule in the far medial left breast is mammographically stable dating back to the mammogram of November 2016. A ribbon shaped biopsy clip is seen within the biopsied fibroadenoma in the upper-outer quadrant of the right breast, which remains mammographically stable. No suspicious mass, architectural distortion, or suspicious microcalcification is identified in either breast to suggest malignancy. Mammographic images were processed with CAD. On physical exam, the upper inner quadrant of the left breast is soft to palpation. No thickening or mass is felt. The skin appears normal. Targeted ultrasound is performed, showing normal breast parenchyma. There are no suspicious findings on ultrasound. IMPRESSION: No evidence of malignancy in either breast. RECOMMENDATION: Screening mammogram at age 27 unless there are persistent or intervening clinical concerns. (Code:SM-B-40A) I have discussed the findings and recommendations with the patient. Results were also provided in writing at the conclusion of the visit. If applicable, a reminder letter will be sent to the patient regarding the next appointment. BI-RADS CATEGORY  1: Negative. Electronically Signed   By: Curlene Dolphin M.D.   On: 03/21/2017  10:44   Mm Diag Breast Tomo Bilateral  Result Date: 03/21/2017 CLINICAL DATA:  42 year old patient with recent pain in the upper inner quadrant of the left breast. She does not palpate a mass. She had ultrasound-guided biopsy of a right breast mass in 2016, demonstrating a benign fibroadenoma. A probably benign nodule was noted in the medial left breast on the prior mammogram of November 2016. EXAM: 2D DIGITAL DIAGNOSTIC BILATERAL MAMMOGRAM WITH CAD AND ADJUNCT TOMO ULTRASOUND LEFT BREAST COMPARISON:  Previous exam(s). ACR Breast Density Category b: There are scattered areas of fibroglandular density. FINDINGS: Metallic skin marker was placed in the region of recent patient pain. No suspicious findings are seen in this portion of the medial left breast on the standard or spot tangential images. A small circumscribed low-density nodule in the far medial left breast is mammographically stable dating back to the mammogram of November 2016. A ribbon shaped biopsy clip is seen within  the biopsied fibroadenoma in the upper-outer quadrant of the right breast, which remains mammographically stable. No suspicious mass, architectural distortion, or suspicious microcalcification is identified in either breast to suggest malignancy. Mammographic images were processed with CAD. On physical exam, the upper inner quadrant of the left breast is soft to palpation. No thickening or mass is felt. The skin appears normal. Targeted ultrasound is performed, showing normal breast parenchyma. There are no suspicious findings on ultrasound. IMPRESSION: No evidence of malignancy in either breast. RECOMMENDATION: Screening mammogram at age 34 unless there are persistent or intervening clinical concerns. (Code:SM-B-40A) I have discussed the findings and recommendations with the patient. Results were also provided in writing at the conclusion of the visit. If applicable, a reminder letter will be sent to the patient regarding the next  appointment. BI-RADS CATEGORY  1: Negative. Electronically Signed   By: Curlene Dolphin M.D.   On: 03/21/2017 10:44    Assessment: Patient Active Problem List   Diagnosis Date Noted  . Pelvic adhesions 03/24/2017  . Abnormal uterine bleeding (AUB) 03/09/2017  . Depression 03/09/2017  . Pelvic pain 03/09/2017  . Irritable bowel syndrome 05/09/2016  . GERD (gastroesophageal reflux disease) 05/09/2016  . 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 Vaginal discharge 08/06/2015  . Yeast infection 08/06/2015  . Breast nodule 08/06/2015  . Breast pain, left 08/06/2015  . Visit for suture removal 03/25/2015  . Warts, genital 01/23/2015  . Screening for STD (sexually transmitted disease) 03/27/2014  . Burning with urination 02/27/2014  . Hematuria 02/27/2014  . SUI (stress urinary incontinence, female) 02/27/2014  . Dysmenorrhea 02/27/2014  . Abdominal pain, generalized 09/06/2013  . Rectal bleeding 09/06/2013    Plan: Patient will undergo surgical management with abdominal supracervical hysterectomy left salpingo oophorectomy, and right salpingectomy. Plans include wide excision of fatty pannus  To allowo improved surgical access to pelvis thru a Pelosi incision..    .mec 04/12/2017 11:43 PM

## 2017-05-02 NOTE — Patient Instructions (Signed)
Cindy Stevenson  05/02/2017     @PREFPERIOPPHARMACY @   Your procedure is scheduled on  05/09/2017   Report to [facility] at  615  A.M.  Call this number if you have problems the morning of surgery:  [phone]   Remember:  Do not eat food or drink liquids after midnight.  Take these medicines the morning of surgery with A SIP OF WATER  Valium, cymbalta, nexium, neurontin, lisinopril, maxalt (if needed).   Do not wear jewelry, make-up or nail polish.  Do not wear lotions, powders, or perfumes, or deoderant.  Do not shave 48 hours prior to surgery.  Men may shave face and neck.  Do not bring valuables to the hospital.  [facility] is not responsible for any belongings or valuables.  Contacts, dentures or bridgework may not be worn into surgery.  Leave your suitcase in the car.  After surgery it may be brought to your room.  For patients admitted to the hospital, discharge time will be determined by your treatment team.  Patients discharged the day of surgery will not be allowed to drive home.   Name and phone number of your driver:   family Special instructions:  Follow the diet and prep instructions given to you by Dr Glo Herring (enclosed).  Please read over the following fact sheets that you were given. Pain Booklet, Coughing and Deep Breathing, Blood Transfusion Information, Lab Information, MRSA Information, Surgical Site Infection Prevention, Anesthesia Post-op Instructions and Care and Recovery After Surgery       Unilateral Salpingo-Oophorectomy Unilateral salpingo-oophorectomy is the surgical removal of one fallopian tube and ovary. The ovaries are small organs that produce eggs in women. The fallopian tubes transport the egg from the ovary to the womb (uterus). A unilateral salpingo-oophorectomy may be done for various reasons, including:  Infection in the fallopian tube and ovary.  Scar tissue in the fallopian tube and ovary (adhesions).  A  cyst or tumor on the ovary.  A need to remove the fallopian tube and ovary when removing the uterus.  Cancer of the fallopian tube or ovary.  The removal of one fallopian tube and ovary will not prevent you from becoming pregnant, put you into menopause, or cause problems with your menstrual periods or sex drive. Tell a health care provider about:  Any allergies you have.  All medicines you are taking, including vitamins, herbs, eye drops, creams, and over-the-counter medicines.  Previous problems you or family members have had with the use of anesthetics.  Any blood disorders you have.  Previous surgeries you have had.  Any medical conditions you have. What are the risks? Generally, this is a safe procedure. However, as with any procedure, complications can occur. Possible complications include:  Injury to surrounding organs.  Bleeding.  Infection.  Blood clots in the legs or lungs.  Problems related to anesthesia.  What happens before the procedure?  Ask your health care provider about changing or stopping your regular medicines. You may need to stop taking certain medicines, such as aspirin or blood thinners, at least 1 week before the surgery.  Do not eat or drink anything for at least 8 hours before the surgery.  If you smoke, do not smoke for at least 2 weeks before the surgery.  Make plans to have someone drive you home after the procedure or after your hospital stay. Also arrange for someone to help you with  activities during recovery. What happens during the procedure?  You will be given medicine to help you relax before the procedure (sedative). You will then be given medicine to make you sleep through the procedure (general anesthetic). These medicines will be given through an IV access tube that is put into one of your veins.  Once you are asleep, your lower abdomen will be shaved and cleaned. A thin, flexible tube (catheter) will be placed in your  bladder.  The surgeon may use a laparoscopic, robotic, or open technique for this surgery: ? In the laparoscopic technique, the surgery is done through two small cuts (incisions) in the abdomen. A thin, lighted tube with a tiny camera on the end (laparoscope) is inserted into one of the incisions. The tools needed for the procedure are put through the other incision. ? A robotic technique may be chosen to perform complex surgery in a small space. In the robotic technique, small incisions are made. A camera and surgical instruments are passed through the incisions. Surgical instruments are controlled with the help of a robotic arm. ? In the open technique, the surgery is done through one large incision in the abdomen.  Using any of these techniques, the surgeon will remove the fallopian tube and ovary. The blood vessels will be clamped and tied.  The surgeon will then use staples or stitches to close the incision or incisions. What happens after the procedure?  You will be taken to a recovery area where your progress will be monitored for 1-3 hours. Your blood pressure, pulse, and temperature will be checked often. You will remain in the recovery area until you are stable and waking up.  If the laparoscopic technique was used, you may be allowed to go home after several hours. You may have some shoulder pain. This is normal and usually goes away in a day or two.  If the open technique was used, you will be admitted to the hospital for a couple of days.  You will be given pain medicine as necessary.  The IV tube and catheter will be removed before you are discharged. This information is not intended to replace advice given to you by your health care provider. Make sure you discuss any questions you have with your health care provider. Document Released: 07/31/2009 Document Revised: 09/02/2016 Document Reviewed: 03/27/2013 Elsevier Interactive Patient Education  2018 [place], also called tubectomy, is the surgical removal of one of the fallopian tubes. The fallopian tubes are where eggs travel from the ovaries to the uterus. Removing one fallopian tube does not prevent you from becoming pregnant. It also does not cause problems with your menstrual periods. You may need a salpingectomy if you:  Have a fertilized egg that attaches to the fallopian tube (ectopic pregnancy), especially one that causes the tube to burst or tear (rupture).  Have an infected fallopian tube.  Have cancer of the fallopian tube or nearby organs.  Have had an ovary removed due to a cyst or tumor.  Have had your uterus removed.  There are three different methods that can be used for a salpingectomy:  Open. This method involves making one large incision in your abdomen.  Laparoscopic. This method involves using a thin, lighted tube with a tiny camera on the end (laparoscope) to help perform the procedure. The laparoscope will allow your surgeon to make several small incisions in the abdomen instead of a large incision.  Robot-assisted: This method involves using a computer  to control surgical instruments that are attached to robotic arms.  Tell a health care provider about:  Any allergies you have.  All medicines you are taking, including vitamins, herbs, eye drops, creams, and over-the-counter medicines.  Any problems you or family members have had with anesthetic medicines.  Any blood disorders you have.  Any surgeries you have had.  Any medical conditions you have.  Whether you are pregnant or may be pregnant. What are the risks? Generally, this is a safe procedure. However, problems may occur, including:  Infection.  Bleeding.  Allergic reactions to medicines.  Damage to other structures or organs.  Blood clots in the legs or lungs.  What happens before the procedure? Staying hydrated Follow instructions from your health care  provider about hydration, which may include:  Up to 2 hours before the procedure - you may continue to drink clear liquids, such as water, clear fruit juice, black coffee, and plain tea.  Eating and drinking restrictions Follow instructions from your health care provider about eating and drinking, which may include:  8 hours before the procedure - stop eating heavy meals or foods such as meat, fried foods, or fatty foods.  6 hours before the procedure - stop eating light meals or foods, such as toast or cereal.  6 hours before the procedure - stop drinking milk or drinks that contain milk.  2 hours before the procedure - stop drinking clear liquids.  Medicines  Ask your health care provider about: ? Changing or stopping your regular medicines. This is especially important if you are taking diabetes medicines or blood thinners. ? Taking medicines such as aspirin and ibuprofen. These medicines can thin your blood. Do not take these medicines before your procedure if your health care provider instructs you not to.  You may be given antibiotic medicine to help prevent infection. General instructions  Do not smoke for at least 2 weeks before your procedure. If you need help quitting, ask your health care provider.  You may have an exam or tests, such as an electrocardiogram (ECG).  You may have a blood or urine sample taken.  Ask your health care provider: ? Whether you should stop removing hair from your surgical area. ? How your surgical site will be marked or identified.  You may be asked to shower with a germ-killing soap.  Plan to have someone take you home from the hospital or clinic.  If you will be going home right after the procedure, plan to have someone with you for 24 hours. What happens during the procedure?  To reduce your risk of infection: ? Your health care team will wash or sanitize their hands. ? Hair may be removed from the surgical area. ? Your skin will be  washed with soap.  An IV tube will be inserted into one of your veins.  You will be given a medicine to make you fall asleep (general anesthetic). You may also be given a medicine to help you relax (sedative).  A thin tube (catheter) may be inserted through your urethra and into your bladder to drain urine during your procedure.  Depending on the type of procedure you are having, one incision or several small incisions will be made in your abdomen.  Your fallopian tube will be cut and removed from where it attaches to your uterus.  Your blood vessels will be clamped and tied to prevent excess bleeding.  The incision(s) in your abdomen will be closed with stitches (sutures), staples,  or skin glue.  A bandage (dressing) may be placed over your incision(s). The procedure may vary among health care providers and hospitals. What happens after the procedure?  Your blood pressure, heart rate, breathing rate, and blood oxygen level will be monitored until the medicines you were given have worn off.  You may continue to receive fluids and medicines through an IV tube.  You may continue to have a catheter draining your urine.  You may have to wear compression stockings. These stockings help to prevent blood clots and reduce swelling in your legs.  You will be given pain medicine as needed.  Do not drive for 24 hours if you received a sedative. Summary  Salpingectomy is a surgical procedure to remove one of the fallopian tubes.  The procedure may be done with an open incision, with a laparoscope, or with computer-controlled instruments.  Depending on the type of procedure you are having, one incision or several small incisions will be made in your abdomen.  Your blood pressure, heart rate, breathing rate, and blood oxygen level will be monitored until the medicines you were given have worn off.  Plan to have someone take you home from the hospital or clinic. This information is not  intended to replace advice given to you by your health care provider. Make sure you discuss any questions you have with your health care provider. Document Released: 02/19/2009 Document Revised: 05/20/2016 Document Reviewed: 03/27/2013 Elsevier Interactive Patient Education  2018 [place] Hysterectomy A supracervical hysterectomy is surgery to remove the top part of the uterus, but not the cervix. You will no longer have menstrual periods or be able to get pregnant after this surgery. The fallopian tubes and ovaries may also be removed (bilateral salpingo-oophorectomy) during this surgery. This surgery is usually performed using a minimally invasive technique called laparoscopy. This technique allows the surgery to be done through small incisions. The minimally invasive technique provides benefits such as less pain, less risk of infection, and shorter recovery time. Tell a health care provider about:  Any allergies you have.  All medicines you are taking, including vitamins, herbs, eye drops, creams, and over-the-counter medicines.  Any problems you or family members have had with anesthetic medicines.  Any blood disorders you have.  Any surgeries you have had.  Any medical conditions you have. What are the risks? Generally, this is a safe procedure. However, as with any procedure, complications can occur. Possible complications include:  Bleeding.  Blood clots in the legs or lung.  Infection.  Injury to surrounding organs.  Problems related to anesthesia.  Conversion to an open abdominal surgery.  Additional surgery later to remove the cervix if you have problems with the cervix.  What happens before the procedure?  Ask your health care provider about changing or stopping your regular medicines.  Do not take aspirin or blood thinners (anticoagulants) for 1 week before the surgery, or as directed by your health care provider.  Do not eat or drink anything  for 8 hours before the surgery, or as directed by your health care provider.  Quit smoking if you smoke.  Arrange for a ride home after surgery and for someone to help you at home during recovery. What happens during the procedure?  You will be given an antibiotic medicine.  An IV tube will be placed in one of your veins. You will be given medicine to make you sleep (general anesthetic).  A gas (carbon dioxide) will be used to inflate  your abdomen. This will allow your surgeon to look inside your abdomen, perform your surgery, and treat any other problems found if necessary.  Three or four small incisions will be made in your abdomen. One of these incisions will be made in the area of your belly button (navel). A thin, flexible tube with a tiny camera and light on the end of it (laparoscope) will be inserted into the incision. The camera on the laparoscope sends a picture to a TV screen in the operating room. This gives your surgeon a good view inside the abdomen.  Other surgical instruments will be inserted through the other incisions.  The uterus will be cut into small pieces and removed through the small incisions.  Your incisions will be closed. What happens after the procedure?  You will be taken to a recovery area where your progress will be monitored until you are awake, stable, and taking fluids well. If there are no other problems, you will then be moved to a regular hospital room, or you will be allowed to go home.  You will likely have minimal discomfort after the surgery because the incisions are so small with the laparoscopic technique.  You will be given pain medicine while you are in the hospital and for when you go home.  If a bilateral salpingo-oophorectomy was performed before menopause, you will go through a sudden (abrupt) menopause. This can be helped with hormone medicines. This information is not intended to replace advice given to you by your health care provider.  Make sure you discuss any questions you have with your health care provider. Document Released: 03/21/2008 Document Revised: 03/10/2016 Document Reviewed: 04/05/2013 Elsevier Interactive Patient Education  2018 [place] Hysterectomy, Care After Refer to this sheet in the next few weeks. These instructions provide you with information on caring for yourself after your procedure. Your health care provider may also give you more specific instructions. Your treatment has been planned according to current medical practices, but problems sometimes occur. Call your health care provider if you have any problems or questions after your procedure. What can I expect after the procedure? After your procedure, it is typical to have some discomfort, tenderness, swelling, and bruising at the surgical sites. This normally lasts for about 2 weeks. Follow these instructions at home:  Get plenty of rest and sleep.  Only take over-the-counter or prescription medicines as directed by your health care provider.  Do not take aspirin. It can cause bleeding.  Do not drive until your health care provider approves.  Follow your health care provider's advice regarding exercise, lifting, and general activities.  Resume your usual diet as directed by your health care provider.  Do not douche, use tampons, or have sexual intercourse for at least 6 weeks or until your health care provider gives you permission.  Change your bandages (dressings) only as directed by your health care provider.  Monitor your temperature.  Take showers instead of baths for 2-3 weeks or as directed by your health care provider.  Drink enough fluids to keep your urine clear or pale yellow.  Do not drink alcohol until your health care provider gives you permission.  If you are constipated, you may take a mild laxative if your health care provider approves. Bran foods may also help with constipation problems.  Try to  have someone home with you for 1-2 weeks to help with activities.  Follow up with your health care provider as directed. Contact a health care provider  if:  You have swelling, redness, or increasing pain in the incision area.  You have pus coming from an incision.  You notice a bad smell coming from the incision or dressing.  You have swelling, redness, or pain in the area around the IV site.  Your incision breaks open.  You feel dizzy or lightheaded.  You have pain or bleeding when you urinate.  You have persistent diarrhea.  You have persistent nausea and vomiting.  You have abnormal vaginal discharge.  You have a rash.  Your pain is not controlled with your prescribed medicine. Get help right away if:  You have a fever.  You have severe abdominal pain.  You have chest pain.  You have shortness of breath.  You faint.  You have pain, swelling, or redness in your leg.  You have heavy vaginal bleeding with blood clots. This information is not intended to replace advice given to you by your health care provider. Make sure you discuss any questions you have with your health care provider. Document Released: 07/24/2013 Document Revised: 03/10/2016 Document Reviewed: 04/05/2013 Elsevier Interactive Patient Education  2018 [place] Anesthesia, Adult General anesthesia is the use of medicines to make a person "go to sleep" (be unconscious) for a medical procedure. General anesthesia is often recommended when a procedure:  Is long.  Requires you to be still or in an unusual position.  Is major and can cause you to lose blood.  Is impossible to do without general anesthesia.  The medicines used for general anesthesia are called general anesthetics. In addition to making you sleep, the medicines:  Prevent pain.  Control your blood pressure.  Relax your muscles.  Tell a health care provider about:  Any allergies you have.  All medicines you  are taking, including vitamins, herbs, eye drops, creams, and over-the-counter medicines.  Any problems you or family members have had with anesthetic medicines.  Types of anesthetics you have had in the past.  Any bleeding disorders you have.  Any surgeries you have had.  Any medical conditions you have.  Any history of heart or lung conditions, such as heart failure, sleep apnea, or chronic obstructive pulmonary disease (COPD).  Whether you are pregnant or may be pregnant.  Whether you use tobacco, alcohol, marijuana, or street drugs.  Any history of Armed forces logistics/support/administrative officer.  Any history of depression or anxiety. What are the risks? Generally, this is a safe procedure. However, problems may occur, including:  Allergic reaction to anesthetics.  Lung and heart problems.  Inhaling food or liquids from your stomach into your lungs (aspiration).  Injury to nerves.  Waking up during your procedure and being unable to move (rare).  Extreme agitation or a state of mental confusion (delirium) when you wake up from the anesthetic.  Air in the bloodstream, which can lead to stroke.  These problems are more likely to develop if you are having a major surgery or if you have an advanced medical condition. You can prevent some of these complications by answering all of your health care provider's questions thoroughly and by following all pre-procedure instructions. General anesthesia can cause side effects, including:  Nausea or vomiting  A sore throat from the breathing tube.  Feeling cold or shivery.  Feeling tired, washed out, or achy.  Sleepiness or drowsiness.  Confusion or agitation.  What happens before the procedure? Staying hydrated Follow instructions from your health care provider about hydration, which may include:  Up to 2 hours  before the procedure - you may continue to drink clear liquids, such as water, clear fruit juice, black coffee, and plain tea.  Eating and  drinking restrictions Follow instructions from your health care provider about eating and drinking, which may include:  8 hours before the procedure - stop eating heavy meals or foods such as meat, fried foods, or fatty foods.  6 hours before the procedure - stop eating light meals or foods, such as toast or cereal.  6 hours before the procedure - stop drinking milk or drinks that contain milk.  2 hours before the procedure - stop drinking clear liquids.  Medicines  Ask your health care provider about: ? Changing or stopping your regular medicines. This is especially important if you are taking diabetes medicines or blood thinners. ? Taking medicines such as aspirin and ibuprofen. These medicines can thin your blood. Do not take these medicines before your procedure if your health care provider instructs you not to. ? Taking new dietary supplements or medicines. Do not take these during the week before your procedure unless your health care provider approves them.  If you are told to take a medicine or to continue taking a medicine on the day of the procedure, take the medicine with sips of water. General instructions   Ask if you will be going home the same day, the following day, or after a longer hospital stay. ? Plan to have someone take you home. ? Plan to have someone stay with you for the first 24 hours after you leave the hospital or clinic.  For 3-6 weeks before the procedure, try not to use any tobacco products, such as cigarettes, chewing tobacco, and e-cigarettes.  You may brush your teeth on the morning of the procedure, but make sure to spit out the toothpaste. What happens during the procedure?  You will be given anesthetics through a mask and through an IV tube in one of your veins.  You may receive medicine to help you relax (sedative).  As soon as you are asleep, a breathing tube may be used to help you breathe.  An anesthesia specialist will stay with you  throughout the procedure. He or she will help keep you comfortable and safe by continuing to give you medicines and adjusting the amount of medicine that you get. He or she will also watch your blood pressure, pulse, and oxygen levels to make sure that the anesthetics do not cause any problems.  If a breathing tube was used to help you breathe, it will be removed before you wake up. The procedure may vary among health care providers and hospitals. What happens after the procedure?  You will wake up, often slowly, after the procedure is complete, usually in a recovery area.  Your blood pressure, heart rate, breathing rate, and blood oxygen level will be monitored until the medicines you were given have worn off.  You may be given medicine to help you calm down if you feel anxious or agitated.  If you will be going home the same day, your health care provider may check to make sure you can stand, drink, and urinate.  Your health care providers will treat your pain and side effects before you go home.  Do not drive for 24 hours if you received a sedative.  You may: ? Feel nauseous and vomit. ? Have a sore throat. ? Have mental slowness. ? Feel cold or shivery. ? Feel sleepy. ? Feel tired. ? Feel sore or  achy, even in parts of your body where you did not have surgery. This information is not intended to replace advice given to you by your health care provider. Make sure you discuss any questions you have with your health care provider. Document Released: 01/10/2008 Document Revised: 03/15/2016 Document Reviewed: 09/17/2015 Elsevier Interactive Patient Education  2018 [employer] Anesthesia, Adult, Care After These instructions provide you with information about caring for yourself after your procedure. Your health care provider may also give you more specific instructions. Your treatment has been planned according to current medical practices, but problems sometimes occur. Call  your health care provider if you have any problems or questions after your procedure. What can I expect after the procedure? After the procedure, it is common to have:  Vomiting.  A sore throat.  Mental slowness.  It is common to feel:  Nauseous.  Cold or shivery.  Sleepy.  Tired.  Sore or achy, even in parts of your body where you did not have surgery.  Follow these instructions at home: For at least 24 hours after the procedure:  Do not: ? Participate in activities where you could fall or become injured. ? Drive. ? Use heavy machinery. ? Drink alcohol. ? Take sleeping pills or medicines that cause drowsiness. ? Make important decisions or sign legal documents. ? Take care of children on your own.  Rest. Eating and drinking  If you vomit, drink water, juice, or soup when you can drink without vomiting.  Drink enough fluid to keep your urine clear or pale yellow.  Make sure you have little or no nausea before eating solid foods.  Follow the diet recommended by your health care provider. General instructions  Have a responsible adult stay with you until you are awake and alert.  Return to your normal activities as told by your health care provider. Ask your health care provider what activities are safe for you.  Take over-the-counter and prescription medicines only as told by your health care provider.  If you smoke, do not smoke without supervision.  Keep all follow-up visits as told by your health care provider. This is important. Contact a health care provider if:  You continue to have nausea or vomiting at home, and medicines are not helpful.  You cannot drink fluids or start eating again.  You cannot urinate after 8-12 hours.  You develop a skin rash.  You have fever.  You have increasing redness at the site of your procedure. Get help right away if:  You have difficulty breathing.  You have chest pain.  You have unexpected  bleeding.  You feel that you are having a life-threatening or urgent problem. This information is not intended to replace advice given to you by your health care provider. Make sure you discuss any questions you have with your health care provider. Document Released: 01/09/2001 Document Revised: 03/07/2016 Document Reviewed: 09/17/2015 Elsevier Interactive Patient Education  [employer].

## 2017-05-03 NOTE — Pre-Procedure Instructions (Signed)
Text sent advising Dr Glo Herring of need for PAT orders for first thing tomorrow morning.

## 2017-05-04 ENCOUNTER — Encounter (HOSPITAL_COMMUNITY)
Admission: RE | Admit: 2017-05-04 | Discharge: 2017-05-04 | Disposition: A | Discharge status: Home / Self Care | Payer: Medicaid Other | Source: Ambulatory Visit | Attending: Obstetrics and Gynecology | Admitting: Obstetrics and Gynecology

## 2017-05-04 ENCOUNTER — Other Ambulatory Visit: Payer: Self-pay | Admitting: Obstetrics and Gynecology

## 2017-05-04 ENCOUNTER — Encounter (HOSPITAL_COMMUNITY): Payer: Self-pay

## 2017-05-04 DIAGNOSIS — Z01818 Encounter for other preprocedural examination: Secondary | ICD-10-CM | POA: Insufficient documentation

## 2017-05-04 HISTORY — DX: Headache: R51

## 2017-05-04 HISTORY — DX: Gastro-esophageal reflux disease without esophagitis: K21.9

## 2017-05-04 HISTORY — DX: Headache, unspecified: R51.9

## 2017-05-04 LAB — COMPREHENSIVE METABOLIC PANEL
ALK PHOS: 70 U/L (ref 38–126)
ALT: 18 U/L (ref 14–54)
ANION GAP: 11 (ref 5–15)
AST: 21 U/L (ref 15–41)
Albumin: 3.9 g/dL (ref 3.5–5.0)
BILIRUBIN TOTAL: 0.6 mg/dL (ref 0.3–1.2)
BUN: 19 mg/dL (ref 6–20)
CALCIUM: 9 mg/dL (ref 8.9–10.3)
CO2: 22 mmol/L (ref 22–32)
Chloride: 101 mmol/L (ref 101–111)
Creatinine, Ser: 0.85 mg/dL (ref 0.44–1.00)
GFR calc non Af Amer: >60 mL/min (ref >60)
GLUCOSE: 171 mg/dL — AB (ref 65–99)
Potassium: 4.2 mmol/L (ref 3.5–5.1)
Sodium: 134 mmol/L — ABNORMAL LOW (ref 135–145)
TOTAL PROTEIN: 6.6 g/dL (ref 6.5–8.1)

## 2017-05-04 LAB — CBC
HEMATOCRIT: 39.9 % (ref 36.0–46.0)
HEMOGLOBIN: 13.2 g/dL (ref 12.0–15.0)
MCH: 29.5 pg (ref 26.0–34.0)
MCHC: 33.1 g/dL (ref 30.0–36.0)
MCV: 89.3 fL (ref 78.0–100.0)
Platelets: 510 10*3/uL — ABNORMAL HIGH (ref 150–400)
RBC: 4.47 MIL/uL (ref 3.87–5.11)
RDW: 12.6 % (ref 11.5–15.5)
WBC: 10.3 10*3/uL (ref 4.0–10.5)

## 2017-05-04 LAB — URINALYSIS, ROUTINE W REFLEX MICROSCOPIC
BACTERIA UA: NONE SEEN
Glucose, UA: NEGATIVE mg/dL
HGB URINE DIPSTICK: NEGATIVE
KETONES UR: NEGATIVE mg/dL
Leukocytes, UA: NEGATIVE
Nitrite: NEGATIVE
PH: 5 (ref 5.0–8.0)
Protein, ur: 30 mg/dL — AB
Specific Gravity, Urine: 1.031 — ABNORMAL HIGH (ref 1.005–1.030)

## 2017-05-04 LAB — SURGICAL PCR SCREEN
MRSA, PCR: NEGATIVE
STAPHYLOCOCCUS AUREUS: NEGATIVE

## 2017-05-04 LAB — HCG, SERUM, QUALITATIVE: Preg, Serum: NEGATIVE

## 2017-05-04 NOTE — Progress Notes (Signed)
   05/04/17 0938  OBSTRUCTIVE SLEEP APNEA  Have you ever been diagnosed with sleep apnea through a sleep study? No  Do you snore loudly (loud enough to be heard through closed doors)?  1  Do you often feel tired, fatigued, or sleepy during the daytime (such as falling asleep during driving or talking to someone)? 1  Has anyone observed you stop breathing during your sleep? 1  Do you have, or are you being treated for high blood pressure? 1  BMI more than 35 kg/m2? 1  Age > 50 (1-yes) 0  Neck circumference greater than:Female 16 inches or larger, Female 17inches or larger? 0  Female Gender (Yes=1) 0  Obstructive Sleep Apnea Score 5  Score 5 or greater  Results sent to PCP

## 2017-05-06 LAB — TYPE AND SCREEN
ABO/RH(D): O POS
ANTIBODY SCREEN: NEGATIVE

## 2017-05-09 ENCOUNTER — Inpatient Hospital Stay (HOSPITAL_COMMUNITY)
Admission: RE | Admit: 2017-05-09 | Discharge: 2017-05-11 | DRG: 742 | Disposition: A | Discharge status: Home / Self Care | Payer: Medicaid Other | Source: Ambulatory Visit | Attending: Obstetrics and Gynecology | Admitting: Obstetrics and Gynecology

## 2017-05-09 ENCOUNTER — Encounter (HOSPITAL_COMMUNITY): Payer: Self-pay | Admitting: *Deleted

## 2017-05-09 ENCOUNTER — Inpatient Hospital Stay (HOSPITAL_COMMUNITY): Payer: Medicaid Other | Admitting: Anesthesiology

## 2017-05-09 ENCOUNTER — Encounter (HOSPITAL_COMMUNITY)
Admission: RE | Disposition: A | Discharge status: Home / Self Care | Payer: Self-pay | Source: Ambulatory Visit | Attending: Obstetrics and Gynecology

## 2017-05-09 DIAGNOSIS — Z880 Allergy status to penicillin: Secondary | ICD-10-CM

## 2017-05-09 DIAGNOSIS — K219 Gastro-esophageal reflux disease without esophagitis: Secondary | ICD-10-CM | POA: Diagnosis present

## 2017-05-09 DIAGNOSIS — N946 Dysmenorrhea, unspecified: Secondary | ICD-10-CM | POA: Diagnosis present

## 2017-05-09 DIAGNOSIS — Z818 Family history of other mental and behavioral disorders: Secondary | ICD-10-CM | POA: Diagnosis not present

## 2017-05-09 DIAGNOSIS — D259 Leiomyoma of uterus, unspecified: Secondary | ICD-10-CM | POA: Diagnosis present

## 2017-05-09 DIAGNOSIS — N939 Abnormal uterine and vaginal bleeding, unspecified: Secondary | ICD-10-CM | POA: Diagnosis present

## 2017-05-09 DIAGNOSIS — Z6841 Body Mass Index (BMI) 40.0 and over, adult: Secondary | ICD-10-CM | POA: Diagnosis not present

## 2017-05-09 DIAGNOSIS — I10 Essential (primary) hypertension: Secondary | ICD-10-CM | POA: Diagnosis present

## 2017-05-09 DIAGNOSIS — E119 Type 2 diabetes mellitus without complications: Secondary | ICD-10-CM | POA: Diagnosis present

## 2017-05-09 DIAGNOSIS — Z833 Family history of diabetes mellitus: Secondary | ICD-10-CM

## 2017-05-09 DIAGNOSIS — F4310 Post-traumatic stress disorder, unspecified: Secondary | ICD-10-CM | POA: Diagnosis present

## 2017-05-09 DIAGNOSIS — Z91040 Latex allergy status: Secondary | ICD-10-CM

## 2017-05-09 DIAGNOSIS — D252 Subserosal leiomyoma of uterus: Secondary | ICD-10-CM

## 2017-05-09 DIAGNOSIS — Z801 Family history of malignant neoplasm of trachea, bronchus and lung: Secondary | ICD-10-CM | POA: Diagnosis not present

## 2017-05-09 DIAGNOSIS — Z90711 Acquired absence of uterus with remaining cervical stump: Secondary | ICD-10-CM | POA: Diagnosis present

## 2017-05-09 DIAGNOSIS — N736 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s (postinfective): Secondary | ICD-10-CM | POA: Diagnosis present

## 2017-05-09 DIAGNOSIS — Z808 Family history of malignant neoplasm of other organs or systems: Secondary | ICD-10-CM

## 2017-05-09 DIAGNOSIS — Z91013 Allergy to seafood: Secondary | ICD-10-CM | POA: Diagnosis not present

## 2017-05-09 HISTORY — PX: BILATERAL SALPINGECTOMY: SHX5743

## 2017-05-09 HISTORY — PX: SUPRACERVICAL ABDOMINAL HYSTERECTOMY: SHX5393

## 2017-05-09 LAB — GLUCOSE, CAPILLARY
GLUCOSE-CAPILLARY: 165 mg/dL — AB (ref 65–99)
Glucose-Capillary: 139 mg/dL — ABNORMAL HIGH (ref 65–99)

## 2017-05-09 SURGERY — HYSTERECTOMY, SUPRACERVICAL, ABDOMINAL
Anesthesia: General | Site: Abdomen

## 2017-05-09 MED ORDER — ONDANSETRON HCL 4 MG PO TABS
4.0000 mg | ORAL_TABLET | Freq: Four times a day (QID) | ORAL | Status: DC | PRN
  Administered 2017-05-11: 4 mg via ORAL
  Filled 2017-05-09: qty 1

## 2017-05-09 MED ORDER — GABAPENTIN 300 MG PO CAPS
300.0000 mg | ORAL_CAPSULE | Freq: Three times a day (TID) | ORAL | Status: DC
  Administered 2017-05-09 – 2017-05-11 (×6): 300 mg via ORAL
  Filled 2017-05-09 (×6): qty 1

## 2017-05-09 MED ORDER — SUCCINYLCHOLINE CHLORIDE 20 MG/ML IJ SOLN
INTRAMUSCULAR | Status: DC | PRN
  Administered 2017-05-09: 140 mg via INTRAVENOUS

## 2017-05-09 MED ORDER — GLYCOPYRROLATE 0.2 MG/ML IJ SOLN
INTRAMUSCULAR | Status: AC
  Filled 2017-05-09: qty 1

## 2017-05-09 MED ORDER — DIPHENHYDRAMINE HCL 50 MG/ML IJ SOLN: 12.5000 mg | Freq: Four times a day (QID) | INTRAMUSCULAR | Status: DC | PRN

## 2017-05-09 MED ORDER — PHENYLEPHRINE 40 MCG/ML (10ML) SYRINGE FOR IV PUSH (FOR BLOOD PRESSURE SUPPORT)
PREFILLED_SYRINGE | INTRAVENOUS | Status: AC
  Filled 2017-05-09: qty 10

## 2017-05-09 MED ORDER — GLYCOPYRROLATE 0.2 MG/ML IJ SOLN
INTRAMUSCULAR | Status: AC
  Filled 2017-05-09: qty 3

## 2017-05-09 MED ORDER — SUCCINYLCHOLINE CHLORIDE 20 MG/ML IJ SOLN
INTRAMUSCULAR | Status: AC
  Filled 2017-05-09: qty 1

## 2017-05-09 MED ORDER — KETOROLAC TROMETHAMINE 30 MG/ML IJ SOLN
30.0000 mg | Freq: Once | INTRAMUSCULAR | Status: AC
Start: 2017-05-09 — End: 2017-05-09
  Administered 2017-05-09: 30 mg via INTRAVENOUS

## 2017-05-09 MED ORDER — ONDANSETRON HCL 4 MG/2ML IJ SOLN
4.0000 mg | Freq: Four times a day (QID) | INTRAMUSCULAR | Status: DC | PRN
  Administered 2017-05-09 – 2017-05-10 (×2): 4 mg via INTRAVENOUS
  Filled 2017-05-09 (×2): qty 2

## 2017-05-09 MED ORDER — FENTANYL CITRATE (PF) 100 MCG/2ML IJ SOLN
INTRAMUSCULAR | Status: AC
  Filled 2017-05-09: qty 2

## 2017-05-09 MED ORDER — HYDROMORPHONE HCL 1 MG/ML IJ SOLN
0.5000 mg | INTRAMUSCULAR | Status: DC | PRN
  Administered 2017-05-09 (×2): 0.5 mg via INTRAVENOUS
  Filled 2017-05-09 (×2): qty 1

## 2017-05-09 MED ORDER — BUPIVACAINE HCL (PF) 0.5 % IJ SOLN
INTRAMUSCULAR | Status: DC | PRN
  Administered 2017-05-09 (×2): 30 mL

## 2017-05-09 MED ORDER — LIDOCAINE HCL (CARDIAC) 20 MG/ML IV SOLN
INTRAVENOUS | Status: DC | PRN
  Administered 2017-05-09: 40 mg via INTRAVENOUS

## 2017-05-09 MED ORDER — FENTANYL CITRATE (PF) 100 MCG/2ML IJ SOLN
INTRAMUSCULAR | Status: DC | PRN
  Administered 2017-05-09: 25 ug via INTRAVENOUS
  Administered 2017-05-09: 50 ug via INTRAVENOUS
  Administered 2017-05-09 (×2): 25 ug via INTRAVENOUS
  Administered 2017-05-09 (×2): 50 ug via INTRAVENOUS
  Administered 2017-05-09: 25 ug via INTRAVENOUS
  Administered 2017-05-09: 100 ug via INTRAVENOUS

## 2017-05-09 MED ORDER — KETAMINE HCL 10 MG/ML IJ SOLN
INTRAMUSCULAR | Status: AC
  Filled 2017-05-09: qty 1

## 2017-05-09 MED ORDER — EPHEDRINE SULFATE 50 MG/ML IJ SOLN
INTRAMUSCULAR | Status: DC | PRN
  Administered 2017-05-09: 10 mg via INTRAVENOUS

## 2017-05-09 MED ORDER — ROCURONIUM BROMIDE 50 MG/5ML IV SOLN
INTRAVENOUS | Status: AC
  Filled 2017-05-09: qty 1

## 2017-05-09 MED ORDER — HYDROMORPHONE 1 MG/ML IV SOLN
INTRAVENOUS | Status: AC
  Filled 2017-05-09: qty 25

## 2017-05-09 MED ORDER — SUGAMMADEX SODIUM 500 MG/5ML IV SOLN
INTRAVENOUS | Status: AC
  Filled 2017-05-09: qty 5

## 2017-05-09 MED ORDER — ROCURONIUM BROMIDE 100 MG/10ML IV SOLN
INTRAVENOUS | Status: DC | PRN
  Administered 2017-05-09: 30 mg via INTRAVENOUS
  Administered 2017-05-09 (×2): 10 mg via INTRAVENOUS

## 2017-05-09 MED ORDER — EPHEDRINE SULFATE 50 MG/ML IJ SOLN
INTRAMUSCULAR | Status: AC
  Filled 2017-05-09: qty 1

## 2017-05-09 MED ORDER — NEOSTIGMINE METHYLSULFATE 10 MG/10ML IV SOLN
INTRAVENOUS | Status: AC
  Filled 2017-05-09: qty 1

## 2017-05-09 MED ORDER — GLYCOPYRROLATE 0.2 MG/ML IJ SOLN
INTRAMUSCULAR | Status: DC | PRN
  Administered 2017-05-09: 0.6 mg via INTRAVENOUS
  Administered 2017-05-09: 0.2 mg via INTRAVENOUS

## 2017-05-09 MED ORDER — METRONIDAZOLE IN NACL 5-0.79 MG/ML-% IV SOLN
500.0000 mg | INTRAVENOUS | Status: AC
  Administered 2017-05-09: 500 mg via INTRAVENOUS
  Filled 2017-05-09: qty 100

## 2017-05-09 MED ORDER — BUPIVACAINE HCL (PF) 0.5 % IJ SOLN
INTRAMUSCULAR | Status: AC
Start: 2017-05-09 — End: ?
  Filled 2017-05-09: qty 30

## 2017-05-09 MED ORDER — SODIUM CHLORIDE 0.9% FLUSH: 9.0000 mL | INTRAVENOUS | Status: DC | PRN

## 2017-05-09 MED ORDER — SODIUM CHLORIDE 0.9 % IV SOLN
INTRAVENOUS | Status: DC
  Administered 2017-05-09 – 2017-05-10 (×4): via INTRAVENOUS

## 2017-05-09 MED ORDER — HYDROMORPHONE HCL 1 MG/ML IJ SOLN
0.2500 mg | INTRAMUSCULAR | Status: DC | PRN
  Administered 2017-05-09 (×2): 0.25 mg via INTRAVENOUS
  Administered 2017-05-09 (×4): 0.5 mg via INTRAVENOUS
  Filled 2017-05-09 (×2): qty 1

## 2017-05-09 MED ORDER — DIPHENHYDRAMINE HCL 12.5 MG/5ML PO ELIX: 12.5000 mg | ORAL_SOLUTION | Freq: Four times a day (QID) | ORAL | Status: DC | PRN

## 2017-05-09 MED ORDER — CIPROFLOXACIN IN D5W 400 MG/200ML IV SOLN
400.0000 mg | INTRAVENOUS | Status: AC
  Administered 2017-05-09: 400 mg via INTRAVENOUS
  Filled 2017-05-09: qty 200

## 2017-05-09 MED ORDER — DEXTROSE 5 % IV SOLN
INTRAVENOUS | Status: DC | PRN
  Administered 2017-05-09: 08:00:00 via INTRAVENOUS

## 2017-05-09 MED ORDER — LIDOCAINE HCL (PF) 1 % IJ SOLN
INTRAMUSCULAR | Status: AC
  Filled 2017-05-09: qty 5

## 2017-05-09 MED ORDER — HYDROMORPHONE 1 MG/ML IV SOLN
INTRAVENOUS | Status: DC
  Administered 2017-05-09: 15:00:00 via INTRAVENOUS
  Administered 2017-05-09: 0 mg via INTRAVENOUS
  Administered 2017-05-09: 2.1 mg via INTRAVENOUS
  Administered 2017-05-09: 0.6 mg via INTRAVENOUS
  Administered 2017-05-10: 1.2 mg via INTRAVENOUS
  Administered 2017-05-10: 1.5 mg via INTRAVENOUS
  Administered 2017-05-10: 2.1 mg via INTRAVENOUS
  Administered 2017-05-10: 0.6 mg via INTRAVENOUS

## 2017-05-09 MED ORDER — OXYCODONE-ACETAMINOPHEN 5-325 MG PO TABS
1.0000 | ORAL_TABLET | ORAL | Status: DC | PRN
  Administered 2017-05-10 – 2017-05-11 (×3): 2 via ORAL
  Filled 2017-05-09 (×3): qty 2

## 2017-05-09 MED ORDER — GLYCOPYRROLATE 0.2 MG/ML IJ SOLN
0.2000 mg | Freq: Once | INTRAMUSCULAR | Status: AC
  Administered 2017-05-09: 0.2 mg via INTRAVENOUS
  Filled 2017-05-09: qty 1

## 2017-05-09 MED ORDER — SUMATRIPTAN SUCCINATE 50 MG PO TABS: 50.0000 mg | ORAL_TABLET | ORAL | Status: DC | PRN

## 2017-05-09 MED ORDER — KETOROLAC TROMETHAMINE 30 MG/ML IJ SOLN: 30.0000 mg | Freq: Four times a day (QID) | INTRAMUSCULAR | Status: DC

## 2017-05-09 MED ORDER — FENTANYL CITRATE (PF) 250 MCG/5ML IJ SOLN
INTRAMUSCULAR | Status: AC
  Filled 2017-05-09: qty 5

## 2017-05-09 MED ORDER — PANTOPRAZOLE SODIUM 40 MG PO TBEC
40.0000 mg | DELAYED_RELEASE_TABLET | Freq: Every day | ORAL | Status: DC
  Administered 2017-05-10 – 2017-05-11 (×2): 40 mg via ORAL
  Filled 2017-05-09 (×2): qty 1

## 2017-05-09 MED ORDER — NALOXONE HCL 0.4 MG/ML IJ SOLN: 0.4000 mg | INTRAMUSCULAR | Status: DC | PRN

## 2017-05-09 MED ORDER — KETOROLAC TROMETHAMINE 30 MG/ML IJ SOLN
30.0000 mg | Freq: Four times a day (QID) | INTRAMUSCULAR | Status: DC
  Administered 2017-05-09 – 2017-05-11 (×7): 30 mg via INTRAVENOUS
  Filled 2017-05-09 (×7): qty 1

## 2017-05-09 MED ORDER — ONDANSETRON HCL 4 MG/2ML IJ SOLN
4.0000 mg | Freq: Once | INTRAMUSCULAR | Status: AC
  Administered 2017-05-09: 4 mg via INTRAVENOUS
  Filled 2017-05-09: qty 2

## 2017-05-09 MED ORDER — PROPOFOL 10 MG/ML IV BOLUS
INTRAVENOUS | Status: AC
  Filled 2017-05-09: qty 40

## 2017-05-09 MED ORDER — MIDAZOLAM HCL 2 MG/2ML IJ SOLN
1.0000 mg | INTRAMUSCULAR | Status: DC
  Administered 2017-05-09: 2 mg via INTRAVENOUS
  Filled 2017-05-09: qty 2

## 2017-05-09 MED ORDER — PROPOFOL 10 MG/ML IV BOLUS
INTRAVENOUS | Status: DC | PRN
  Administered 2017-05-09: 150 mg via INTRAVENOUS

## 2017-05-09 MED ORDER — DULOXETINE HCL 60 MG PO CPEP
60.0000 mg | ORAL_CAPSULE | Freq: Every day | ORAL | Status: DC
  Administered 2017-05-09 – 2017-05-10 (×2): 60 mg via ORAL
  Filled 2017-05-09 (×2): qty 1

## 2017-05-09 MED ORDER — NEOSTIGMINE METHYLSULFATE 10 MG/10ML IV SOLN
INTRAVENOUS | Status: DC | PRN
  Administered 2017-05-09: 4 mg via INTRAVENOUS

## 2017-05-09 MED ORDER — IBUPROFEN 600 MG PO TABS: 600.0000 mg | ORAL_TABLET | Freq: Four times a day (QID) | ORAL | Status: DC | PRN

## 2017-05-09 MED ORDER — ONDANSETRON HCL 4 MG/2ML IJ SOLN: 4.0000 mg | Freq: Four times a day (QID) | INTRAMUSCULAR | Status: DC | PRN

## 2017-05-09 MED ORDER — SODIUM CHLORIDE 0.9 % IJ SOLN
INTRAMUSCULAR | Status: AC
  Filled 2017-05-09: qty 10

## 2017-05-09 MED ORDER — LISINOPRIL 10 MG PO TABS
10.0000 mg | ORAL_TABLET | Freq: Every day | ORAL | Status: DC
  Administered 2017-05-10 – 2017-05-11 (×2): 10 mg via ORAL
  Filled 2017-05-09 (×2): qty 1

## 2017-05-09 MED ORDER — BUPIVACAINE HCL (PF) 0.5 % IJ SOLN
INTRAMUSCULAR | Status: AC
  Filled 2017-05-09: qty 30

## 2017-05-09 MED ORDER — LACTATED RINGERS IV SOLN
INTRAVENOUS | Status: DC
  Administered 2017-05-09: 1000 mL via INTRAVENOUS
  Administered 2017-05-09 (×2): via INTRAVENOUS
  Administered 2017-05-09: 1000 mL via INTRAVENOUS

## 2017-05-09 MED ORDER — 0.9 % SODIUM CHLORIDE (POUR BTL) OPTIME
TOPICAL | Status: DC | PRN
  Administered 2017-05-09: 2000 mL

## 2017-05-09 MED ORDER — DEXAMETHASONE SODIUM PHOSPHATE 4 MG/ML IJ SOLN
INTRAMUSCULAR | Status: AC
  Filled 2017-05-09: qty 1

## 2017-05-09 MED ORDER — KETOROLAC TROMETHAMINE 30 MG/ML IJ SOLN
INTRAMUSCULAR | Status: AC
  Filled 2017-05-09: qty 1

## 2017-05-09 SURGICAL SUPPLY — 54 items
BAG HAMPER (MISCELLANEOUS) ×5 IMPLANT
BENZOIN TINCTURE PRP APPL 2/3 (GAUZE/BANDAGES/DRESSINGS) ×5 IMPLANT
BLADE SURG SZ10 CARB STEEL (BLADE) ×5 IMPLANT
CELLS DAT CNTRL 66122 CELL SVR (MISCELLANEOUS) ×3 IMPLANT
CLOSURE STERI STRIP 1/2 X4 (GAUZE/BANDAGES/DRESSINGS) ×5 IMPLANT
CLOSURE WOUND 1/2 X4 (GAUZE/BANDAGES/DRESSINGS)
CLOTH BEACON ORANGE TIMEOUT ST (SAFETY) ×5 IMPLANT
COVER LIGHT HANDLE STERIS (MISCELLANEOUS) ×10 IMPLANT
DECANTER SPIKE VIAL GLASS SM (MISCELLANEOUS) ×10 IMPLANT
DRAPE WARM FLUID 44X44 (DRAPE) ×5 IMPLANT
DRSG OPSITE POSTOP 4X10 (GAUZE/BANDAGES/DRESSINGS) ×5 IMPLANT
DURAPREP 26ML APPLICATOR (WOUND CARE) ×5 IMPLANT
ELECT REM PT RETURN 9FT ADLT (ELECTROSURGICAL) ×5
ELECTRODE REM PT RTRN 9FT ADLT (ELECTROSURGICAL) ×3 IMPLANT
EVACUATOR DRAINAGE 10X20 100CC (DRAIN) ×3 IMPLANT
EVACUATOR SILICONE 100CC (DRAIN) ×2
FORMALIN 10 PREFIL 480ML (MISCELLANEOUS) ×5 IMPLANT
GAUZE SPONGE 4X4 12PLY STRL (GAUZE/BANDAGES/DRESSINGS) ×5 IMPLANT
GLOVE BIOGEL PI IND STRL 7.0 (GLOVE) ×9 IMPLANT
GLOVE BIOGEL PI IND STRL 9 (GLOVE) ×3 IMPLANT
GLOVE BIOGEL PI INDICATOR 7.0 (GLOVE) ×6
GLOVE BIOGEL PI INDICATOR 9 (GLOVE) ×2
GLOVE SS PI 9.0 STRL (GLOVE) ×5 IMPLANT
GLOVE SURG SS PI 6.5 STRL IVOR (GLOVE) ×5 IMPLANT
GLOVE SURG SS PI 7.0 STRL IVOR (GLOVE) ×5 IMPLANT
GOWN SPEC L3 XXLG W/TWL (GOWN DISPOSABLE) ×5 IMPLANT
GOWN STRL REUS W/TWL LRG LVL3 (GOWN DISPOSABLE) ×10 IMPLANT
INST SET MAJOR GENERAL (KITS) ×5 IMPLANT
KIT ROOM TURNOVER APOR (KITS) ×5 IMPLANT
MANIFOLD NEPTUNE II (INSTRUMENTS) ×5 IMPLANT
NEEDLE HYPO 25X1 1.5 SAFETY (NEEDLE) ×5 IMPLANT
NS IRRIG 1000ML POUR BTL (IV SOLUTION) ×10 IMPLANT
PACK ABDOMINAL MAJOR (CUSTOM PROCEDURE TRAY) ×5 IMPLANT
PAD ARMBOARD 7.5X6 YLW CONV (MISCELLANEOUS) ×5 IMPLANT
RTRCTR WOUND ALEXIS 18CM MED (MISCELLANEOUS) ×5
SET BASIN LINEN APH (SET/KITS/TRAYS/PACK) ×5 IMPLANT
SPONGE DRAIN TRACH 4X4 STRL 2S (GAUZE/BANDAGES/DRESSINGS) ×5 IMPLANT
SPONGE LAP 18X18 X RAY DECT (DISPOSABLE) ×5 IMPLANT
STRIP CLOSURE SKIN 1/2X4 (GAUZE/BANDAGES/DRESSINGS) IMPLANT
SUT CHROMIC 0 CT 1 (SUTURE) ×40 IMPLANT
SUT CHROMIC 2 0 CT 1 (SUTURE) ×10 IMPLANT
SUT ETHILON 3 0 FSL (SUTURE) ×5 IMPLANT
SUT PDS AB CT VIOLET #0 27IN (SUTURE) IMPLANT
SUT PLAIN CT 1/2CIR 2-0 27IN (SUTURE) ×5 IMPLANT
SUT PROLENE 0 CT 1 30 (SUTURE) IMPLANT
SUT VIC AB 0 CT1 27 (SUTURE)
SUT VIC AB 0 CT1 27XBRD ANTBC (SUTURE) IMPLANT
SUT VICRYL 4 0 KS 27 (SUTURE) ×5 IMPLANT
SUT VICRYL AB 2 0 TIES (SUTURE) ×5 IMPLANT
SYR BULB IRRIGATION 50ML (SYRINGE) ×5 IMPLANT
SYR CONTROL 10ML LL (SYRINGE) ×5 IMPLANT
TOWEL BLUE STERILE X RAY DET (MISCELLANEOUS) ×5 IMPLANT
TOWEL OR 17X26 4PK STRL BLUE (TOWEL DISPOSABLE) ×5 IMPLANT
TRAY FOLEY BAG SILVER LF 16FR (SET/KITS/TRAYS/PACK) ×5 IMPLANT

## 2017-05-09 NOTE — Transfer of Care (Signed)
Immediate Anesthesia Transfer of Care Note  Patient: Cindy Stevenson  Procedure(s) Performed: Procedure(s): HYSTERECTOMY SUPRACERVICAL ABDOMINAL (N/A) BILATERAL SALPINGECTOMY (Bilateral)  Patient Location: PACU  Anesthesia Type:General  Level of Consciousness: awake, oriented and patient cooperative  Airway & Oxygen Therapy: Patient Spontanous Breathing and Patient connected to face mask oxygen  Post-op Assessment: Report given to RN and Post -op Vital signs reviewed and stable  Post vital signs: Reviewed and stable  Last Vitals:  Vitals:   05/09/17 0715 05/09/17 0720  BP: 120/80 123/79  Resp: (!) 24 (!) 32  Temp:      Last Pain:  Vitals:   05/09/17 0655  TempSrc: Oral      Patients Stated Pain Goal: 8 (07/09/29 0762)  Complications: No apparent anesthesia complications

## 2017-05-09 NOTE — Anesthesia Preprocedure Evaluation (Signed)
Anesthesia Evaluation  Patient identified by MRN, date of birth, ID band Patient awake    Reviewed: Allergy & Precautions, H&P , NPO status , Patient's Chart, lab work & pertinent test results  Airway Mallampati: II       Dental  (+) Teeth Intact   Pulmonary neg pulmonary ROS,    breath sounds clear to auscultation       Cardiovascular hypertension, Pt. on medications  Rhythm:Regular Rate:Normal     Neuro/Psych  Headaches, PSYCHIATRIC DISORDERS (PTSD) Anxiety Depression    GI/Hepatic GERD  Controlled and Medicated,  Endo/Other  diabetes, Type 2Morbid obesity  Renal/GU      Musculoskeletal   Abdominal   Peds  Hematology   Anesthesia Other Findings   Reproductive/Obstetrics                             Anesthesia Physical Anesthesia Plan  ASA: III  Anesthesia Plan: General   Post-op Pain Management:    Induction: Intravenous, Rapid sequence and Cricoid pressure planned  PONV Risk Score and Plan:   Airway Management Planned: Oral ETT  Additional Equipment:   Intra-op Plan:   Post-operative Plan: Extubation in OR  Informed Consent: I have reviewed the patients History and Physical, chart, labs and discussed the procedure including the risks, benefits and alternatives for the proposed anesthesia with the patient or authorized representative who has indicated his/her understanding and acceptance.     Plan Discussed with:   Anesthesia Plan Comments:         Anesthesia Quick Evaluation

## 2017-05-09 NOTE — Anesthesia Procedure Notes (Signed)
Procedure Name: Intubation Date/Time: 05/09/2017 7:38 AM Performed by: Andree Elk, AMY A Pre-anesthesia Checklist: Patient identified, Patient being monitored, Timeout performed, Emergency Drugs available and Suction available Patient Re-evaluated:Patient Re-evaluated prior to induction Oxygen Delivery Method: Circle System Utilized Preoxygenation: Pre-oxygenation with 100% oxygen Induction Type: IV induction, Rapid sequence and Cricoid Pressure applied Laryngoscope Size: Miller and 3 Grade View: Grade I Tube type: Oral Tube size: 7.0 mm Number of attempts: 1 Airway Equipment and Method: Stylet Placement Confirmation: ETT inserted through vocal cords under direct vision,  positive ETCO2 and breath sounds checked- equal and bilateral Secured at: 21 cm Tube secured with: Tape Dental Injury: Teeth and Oropharynx as per pre-operative assessment

## 2017-05-09 NOTE — Op Note (Signed)
Please see the brief operative note for surgical details 

## 2017-05-09 NOTE — Brief Op Note (Signed)
05/09/2017  10:23 AM  PATIENT:  Cindy Stevenson  41 y.o. female  PRE-OPERATIVE DIAGNOSIS:  Dysmenorrhea Left Ovarian adhesions  POST-OPERATIVE DIAGNOSIS:  Dysmenorrhea Left Ovarian adhesions uterine fibroids, small  PROCEDURE:  Procedure(s): HYSTERECTOMY SUPRACERVICAL ABDOMINAL (N/A) BILATERAL SALPINGECTOMY (Bilateral)  SURGEON:  Surgeon(s) and Role:    Jonnie Kind, MD - Primary  PHYSICIAN ASSISTANT:   ASSISTANTS: Mable Fill, RNFA   ANESTHESIA:   local and general  EBL:  Total I/O In: 3400 [I.V.:3400] Out: 200 [Urine:100; Blood:100]  BLOOD ADMINISTERED:none  DRAINS: (One 10 mm) Jackson-Pratt drain(s) with closed bulb suction in the Subcutaneous space and Urinary Catheter (Foley)   LOCAL MEDICATIONS USED:  MARCAINE    and Amount: 30 ml  SPECIMEN:  Source of Specimen:  Uterus, bilateral fallopian tubes  DISPOSITION OF SPECIMEN:  PATHOLOGY  COUNTS:  YES  TOURNIQUET:  * No tourniquets in log *  DICTATION: .Dragon Dictation  PLAN OF CARE: Admit to inpatient   PATIENT DISPOSITION:  PACU - hemodynamically stable.   Delay start of Pharmacological VTE agent (>24hrs) due to surgical blood loss or risk of bleeding: not applicable Details of procedure: Patient was taken operating room prepped and draped for lower abdominal surgery. Abdomen was prepped and draped. The previously demarcated ellipse of skin and fatty tissue was sharply excised after timeout was performed and perioperative antibiotics administered. The 35 cm wide but 10 cm side-to-side ellipse of skin and fatty tissue was removed off the lower abdomen to allow access to the lower pelvis through a Pelosi incision. The abdominal cavity was entered through the midline lower abdominal portion of the incision carefully entering the peritoneal cavity and finding no adhesions to the anterior abdominal wall. Palpation abdomen revealed that there were some tight adhesions in the left lower quadrant in the area of  the left ovary the bowel was packed away using moist laparotomy tapes and a moistened folded towel with radiographic features, and then we visualized the pelvis. There were some thin adhesions from the sigmoid colon to the lateral pelvic sidewall that incorporated the right ovary. These were sharply dissected free mobilizing the ovary and the bowel on this side. Uterus was quite small and the left ovary was completely normal. Fallopian tubes were normal. Attention was then directed to the uterus where round ligaments were doubly ligated and then transected between them exposing the utero-ovarian movements and fallopian tube. The fallopian tubes were cauterized and transected so that the utero-ovarian ligament could be isolated clamped cut and suture ligated using 2 Kelly clamps, Mayo scissors and 0 chromic suture ligature. On the left side, then the uterine vessels were addressed first by placing a curved Heaney clamp across the remnants of the broad ligament in this. A straight Heaney clamp was then used to cross-clamp the uterine vessels which was then transected and suture ligated with 0 chromic. Bladder flap had been developed anteriorly the right side was treated in similar fashion and bladder flap was further developed with moistened gauze used to push the bladder flap inferior. Straight Heaney clamps were used on each side 1 in order to mobilize the body of the uterus, then a and the uterus to protect the bowel, the uterus and lower uterine segment were amputated off of the cervix. The cervix was treated with point cautery as necessary to achieve majority of hemostasis and then with just of the cervix remnant cord out in a conical shape, the cervix was then pulled together front to back closing the cuff  and achieve rather satisfactory hemostasis pelvis was irrigated. Salpingectomy followed by identifying the fallopian tube on each side elevating it and isolating the mesosalpinx and cross clamping with a  Kelly clamp and amputating the fallopian tube and then tying the pedicle with a 2-0 chromic. Inch was then directed to the opposite side where similar process was performed. Pelvis was inspected and irrigated and confirmed as adequately hemostatic. The left utero-ovarian ligament required a single additional suture to achieve hemostasis. The pelvis was then consented hemostatic. The cervical stump was oversewn with 2-0 chromic, pulling the bladder flap edge over the remnants of the cervical stop. Pelvis was inspected and was consented hemostatic. Laparotomy clip was removed. Needle counts were correct. The anterior peritoneum was closed with running 2-0 chromic. The fascia was then closed using 0 PDS in a continuous running fashion inferior to superior with good hemostasis. Incision was irrigated. The broad transverse portion of the incision was then irrigated confirmed as hemostatic, reapproximated using interrupted horizontal mattress sutures of 20 plain in 2 layers. A JP drain was allowed to exit through a stab incision 2 cm lateral to the right and the incision and then placed a suction after completion of the closure. Closure consisted of a 2 layer closure the subcutaneous fat and a subcuticular 4-0 Vicryl closure the skin incision. She tolerated procedure well and went to recovery room in stable condition

## 2017-05-09 NOTE — Anesthesia Postprocedure Evaluation (Signed)
Anesthesia Post Note  Patient: Cindy Stevenson  Procedure(s) Performed: Procedure(s) (LRB): HYSTERECTOMY SUPRACERVICAL ABDOMINAL (N/A) BILATERAL SALPINGECTOMY (Bilateral)  Patient location during evaluation: PACU Anesthesia Type: General Level of consciousness: awake and alert, oriented and patient cooperative Pain management: pain level controlled Vital Signs Assessment: post-procedure vital signs reviewed and stable Respiratory status: spontaneous breathing, respiratory function stable and patient connected to face mask oxygen Cardiovascular status: stable Postop Assessment: no signs of nausea or vomiting Anesthetic complications: no     Last Vitals:  Vitals:   05/09/17 0950 05/09/17 1000  BP:  116/73  Pulse:  88  Resp: 14 12  Temp: 37.1 C     Last Pain:  Vitals:   05/09/17 0950  TempSrc:   PainSc: Asleep                 Kailer Heindel A

## 2017-05-09 NOTE — H&P (Signed)
Jonnie Kind, MD  Obstetrics/Gynecology  Expand All Collapse All   [] Hide copied text [] Hover for attribution information Preoperative History and Physical  Cindy Stevenson is a 41 y.o. G1P0010 here for surgical management of pelvic pain, Left sided predominance, where pt has sonographic evidence of an ovary with adherence..   No significant preoperative concerns. Pt fully aware that after hysterectomy , sterility is permanent  Proposed surgery: Abdominal supracervical hysterectomy, left salpingoophorectomy, right salpingectomy.      Past Medical History:  Diagnosis Date  . Anxiety   . Breast nodule 08/06/2015  . Breast pain, left 08/06/2015  . Burning with urination 02/27/2014  . Depression   . Diabetes mellitus without complication ([place])   . Dysmenorrhea 02/27/2014  . 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 Hematuria 02/27/2014  . Hypertension   . PTSD (post-traumatic stress disorder)   . Screening for STD (sexually transmitted disease) 03/27/2014  . Seasonal allergies   . SUI (stress urinary incontinence, female) 02/27/2014  . Vaginal discharge 08/06/2015  . Warts, genital 01/23/2015  . Yeast infection 08/06/2015        Past Surgical History:  Procedure Laterality Date  . COLONOSCOPY N/A 09/10/2013   TDD:UKGUR;/KYHCW internal hemorrhoids  . ESOPHAGOGASTRODUODENOSCOPY N/A 09/10/2013   CBJ:SEGBTDVV ring at the gastroesophagral juctions/mild non-erosive gastritis  . LABIOPLASTY  02/14/2012   Procedure: LABIAPLASTY;  Surgeon: Jonnie Kind, MD;  Location: [facility];  Service: Gynecology;  Laterality: Right;  of the right labia minora  . WISDOM TOOTH EXTRACTION  1997   Dr. Owens Shark                   OB History  Gravida Para Term Preterm AB Living  1       1    SAB TAB Ectopic Multiple Live Births    1          # Outcome Date GA Lbr Len/2nd Weight Sex Delivery Anes PTL Lv  1 TAB             Patient denies any other pertinent gynecologic  issues.         Current Outpatient Prescriptions on File Prior to Visit  Medication Sig Dispense Refill  . diazepam (VALIUM) 5 MG tablet Take 5 mg by mouth 2 (two) times daily.    . DULoxetine (CYMBALTA) 30 MG capsule Take 30 mg by mouth 2 (two) times daily.    Marland Kitchen esomeprazole (NEXIUM) 40 MG capsule Take 40 mg by mouth daily at 12 noon.    . gabapentin (NEURONTIN) 300 MG capsule 300 mg 3 (three) times daily.   1  . lisinopril (PRINIVIL,ZESTRIL) 10 MG tablet Take 10 mg by mouth daily.    . rizatriptan (MAXALT) 10 MG tablet Take 10 mg by mouth as needed for migraine. May repeat in 2 hours if needed     No current facility-administered medications on file prior to visit.         Allergies  Allergen Reactions  . Latex   . Codeine     Vomiting  . Shellfish Allergy     Breaks out in Hives  . Amoxicillin Rash    Social History:   reports that she has never smoked. She has never used smokeless tobacco. She reports that she does not drink alcohol or use drugs.       Family History  Problem Relation Age of Onset  . Lung cancer Mother   . Asthma Sister   . Allergies  Sister   . Mental illness Maternal Aunt        depression, PTSD  . Diabetes Maternal Grandmother   . Cancer Maternal Grandfather        skin  . Colon cancer Neg Hx     Review of Systems: Noncontributory  PHYSICAL EXAM: Blood pressure 124/88, pulse 86, weight 225 lb 12.8 oz (102.4 kg), last menstrual period 03/23/2017. General appearance - alert, well appearing, and in no distress Chest - clear to auscultation, no wheezes, rales or rhonchi, symmetric air entry Heart - normal rate and regular rhythm Abdomen - soft, nontender, nondistended, no masses or organomegaly                     Lax lower abdomen, with obesity and pannus. Pelvic - examination Efg normal  Vagina normal secretions. Uterus normal cervix. Vaguely tender nonpurulent uterus  Extremities - peripheral pulses  normal, no pedal edema, no clubbing or cyanosis  Labs: CBC    Component Value Date/Time   WBC 10.3 05/04/2017 1000   RBC 4.47 05/04/2017 1000   HGB 13.2 05/04/2017 1000   HGB 13.9 08/29/2013   HCT 39.9 05/04/2017 1000   HCT 39 08/29/2013   PLT 510 (Stevenson) 05/04/2017 1000   MCV 89.3 05/04/2017 1000   MCH 29.5 05/04/2017 1000   MCHC 33.1 05/04/2017 1000   RDW 12.6 05/04/2017 1000   CMP Latest Ref Rng & Units 05/04/2017 08/24/2013 02/14/2012  Glucose 65 - 99 mg/dL 171(Stevenson) - 98  BUN 6 - 20 mg/dL 19 - 17  Creatinine 0.44 - 1.00 mg/dL 0.85 - 0.98  Sodium 135 - 145 mmol/L 134(L) - 139  Potassium 3.5 - 5.1 mmol/L 4.2 - 3.5  Chloride 101 - 111 mmol/L 101 - 103  CO2 22 - 32 mmol/L 22 - 25  Calcium 8.9 - 10.3 mg/dL 9.0 - 9.5  Total Protein 6.5 - 8.1 g/dL 6.6 - -  Total Bilirubin 0.3 - 1.2 mg/dL 0.6 1.2 -  Alkaline Phos 38 - 126 U/L 70 79 -  AST 15 - 41 U/L 21 13 -  ALT 14 - 54 U/L 18 15 -     Imaging Studies: GYNECOLOGIC SONOGRAM   Cindy Stevenson is a 41 y.o. G1P0010 LMP 02/17/2017 for a pelvic sonogram for abnormal uterine bleeding w/pelvic pain.  Uterus                      7.5 x 3.9 x 5.6 cm, homogeneous anteverted uterus,wnl  Endometrium          7.8 mm, symmetrical, wnl  Right ovary             3.4 x 2.5 x 2.8 cm, wnl  Left ovary                2.8 x 2.3 x 3.2 cm, unable to slide ovary w/probe pressure,appears to be adherent to uterine fundus.  No free fluid   Technician Comments:  PELVIC US TA/TV: homogeneous anteverted uterus,wnl,EEC 7.8 mm,normal ovaries bilat,right ovary appears mobile,left ovary appears to be adherent to uterine fundus,left adnexal pain,no free fluid     [name] 03/10/2017 10:07 AM  Clinical Impression and recommendations:  I have reviewed the sonogram results above, combined with the patient's current clinical course, below are my impressions and any appropriate recommendations for management based on the sonographic  findings.  Uterus and endometrium are normal Both ovaries are normal size but the left ovary  does not slide during the exam which suggests probable adherence, in this case, to the side of the uterus which may be source of patient's pain symptoms   Cindy Stevenson 03/16/2017 9:04 AM  ImagingResults   Assessment:     Patient Active Problem List   Diagnosis Date Noted  . Pelvic adhesions 03/24/2017  . Abnormal uterine bleeding (AUB) 03/09/2017  . Depression 03/09/2017  . Pelvic pain 03/09/2017  . Irritable bowel syndrome 05/09/2016  . GERD (gastroesophageal reflux disease) 05/09/2016  . 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 Vaginal discharge 08/06/2015  . Yeast infection 08/06/2015  . Breast nodule 08/06/2015  . Breast pain, left 08/06/2015  . Visit for suture removal 03/25/2015  . Warts, genital 01/23/2015  . Screening for STD (sexually transmitted disease) 03/27/2014  . Burning with urination 02/27/2014  . Hematuria 02/27/2014  . SUI (stress urinary incontinence, female) 02/27/2014  . Dysmenorrhea 02/27/2014  . Abdominal pain, generalized 09/06/2013  . Rectal bleeding 09/06/2013    Plan: Patient will undergo surgical management with abdominal supracervical hysterectomy left salpingo oophorectomy, and right salpingectomy. Plans include wide excision of fatty pannus  To allowo improved surgical access to pelvis thru a Pelosi incision..    .mec 04/12/2017 11:43 PM     Electronically signed by Jonnie Kind, MD at 04/12/2017 11:49 PM

## 2017-05-10 ENCOUNTER — Encounter (HOSPITAL_COMMUNITY): Payer: Self-pay | Admitting: Obstetrics and Gynecology

## 2017-05-10 LAB — BASIC METABOLIC PANEL
Anion gap: 8 (ref 5–15)
BUN: 13 mg/dL (ref 6–20)
CO2: 28 mmol/L (ref 22–32)
CREATININE: 0.83 mg/dL (ref 0.44–1.00)
Calcium: 8.2 mg/dL — ABNORMAL LOW (ref 8.9–10.3)
Chloride: 98 mmol/L — ABNORMAL LOW (ref 101–111)
GFR calc Af Amer: >60 mL/min (ref >60)
GLUCOSE: 118 mg/dL — AB (ref 65–99)
Potassium: 3.9 mmol/L (ref 3.5–5.1)
SODIUM: 134 mmol/L — AB (ref 135–145)

## 2017-05-10 LAB — GLUCOSE, CAPILLARY
GLUCOSE-CAPILLARY: 122 mg/dL — AB (ref 65–99)
GLUCOSE-CAPILLARY: 124 mg/dL — AB (ref 65–99)
Glucose-Capillary: 133 mg/dL — ABNORMAL HIGH (ref 65–99)

## 2017-05-10 LAB — CBC
HCT: 35.7 % — ABNORMAL LOW (ref 36.0–46.0)
Hemoglobin: 11.5 g/dL — ABNORMAL LOW (ref 12.0–15.0)
MCH: 29.3 pg (ref 26.0–34.0)
MCHC: 32.2 g/dL (ref 30.0–36.0)
MCV: 91.1 fL (ref 78.0–100.0)
PLATELETS: 424 10*3/uL — AB (ref 150–400)
RBC: 3.92 MIL/uL (ref 3.87–5.11)
RDW: 12.8 % (ref 11.5–15.5)
WBC: 13.3 10*3/uL — ABNORMAL HIGH (ref 4.0–10.5)

## 2017-05-10 MED ORDER — INSULIN ASPART 100 UNIT/ML ~~LOC~~ SOLN
0.0000 [IU] | Freq: Three times a day (TID) | SUBCUTANEOUS | Status: DC
  Administered 2017-05-10 (×2): 2 [IU] via SUBCUTANEOUS

## 2017-05-10 MED ORDER — HYDROMORPHONE HCL 1 MG/ML IJ SOLN
1.0000 mg | INTRAMUSCULAR | Status: DC | PRN
  Administered 2017-05-10: 1 mg via INTRAVENOUS
  Filled 2017-05-10: qty 1

## 2017-05-10 NOTE — Anesthesia Postprocedure Evaluation (Signed)
Anesthesia Post Note  Patient: Cindy Stevenson  Procedure(s) Performed: Procedure(s) (LRB): HYSTERECTOMY SUPRACERVICAL ABDOMINAL (N/A) BILATERAL SALPINGECTOMY (Bilateral)  Patient location during evaluation: Nursing Unit Anesthesia Type: General Level of consciousness: awake and alert Pain management: satisfactory to patient Vital Signs Assessment: post-procedure vital signs reviewed and stable Respiratory status: spontaneous breathing Cardiovascular status: stable Postop Assessment: no signs of nausea or vomiting Anesthetic complications: no Comments: Labs reviewed     Last Vitals:  Vitals:   05/10/17 0512 05/10/17 0735  BP: 129/66   Pulse: 83   Resp: (!) 22 15  Temp: 36.5 C     Last Pain:  Vitals:   05/10/17 0735  TempSrc:   PainSc: 8                  Cindy Stevenson

## 2017-05-10 NOTE — Addendum Note (Signed)
Addendum  created 05/10/17 1030 by Vista Deck, CRNA   Sign clinical note

## 2017-05-10 NOTE — Progress Notes (Signed)
Pt. Foley removed per MD orders, pt tolerated well.

## 2017-05-11 LAB — GLUCOSE, CAPILLARY: Glucose-Capillary: 107 mg/dL — ABNORMAL HIGH (ref 65–99)

## 2017-05-11 MED ORDER — IBUPROFEN 600 MG PO TABS: 600.0000 mg | ORAL_TABLET | Freq: Four times a day (QID) | ORAL | 0 refills | Status: DC | PRN

## 2017-05-11 MED ORDER — ONDANSETRON 4 MG PO TBDP: 4.0000 mg | ORAL_TABLET | Freq: Four times a day (QID) | ORAL | 1 refills | Status: DC | PRN

## 2017-05-11 MED ORDER — OXYCODONE-ACETAMINOPHEN 5-325 MG PO TABS: 1.0000 | ORAL_TABLET | ORAL | 0 refills | Status: DC | PRN

## 2017-05-11 NOTE — Progress Notes (Signed)
1 Procedure(s) (LRB): HYSTERECTOMY SUPRACERVICAL ABDOMINAL (N/A) BILATERAL SALPINGECTOMY (Bilateral)  Subjective: seen this am and again this pm to consider early d/c Patient reports incisional pain, tolerating PO and no problems voiding.    Objective: I have reviewed patient's vital signs, medications and labs.  General: alert, cooperative, no distress and anxious Resp: clear to auscultation bilaterally GI: soft, non-tender; bowel sounds normal; no masses,  no organomegaly and incision: clean, dry, intact and jp drain clear fluid CBC    Component Value Date/Time   WBC 13.3 (H) 05/10/2017 0604   RBC 3.92 05/10/2017 0604   HGB 11.5 (L) 05/10/2017 0604   HGB 13.9 08/29/2013   HCT 35.7 (L) 05/10/2017 0604   HCT 39 08/29/2013   PLT 424 (H) 05/10/2017 0604   MCV 91.1 05/10/2017 0604   MCH 29.3 05/10/2017 0604   MCHC 32.2 05/10/2017 0604   RDW 12.8 05/10/2017 0604    Assessment: s/p Procedure(s): HYSTERECTOMY SUPRACERVICAL ABDOMINAL (N/A) BILATERAL SALPINGECTOMY (Bilateral): stable, progressing well and hesitant to go home yet  Plan: Encourage ambulation Advance to PO medication Discontinue IV fluids 1day   Shariyah Eland V 05/11/2017, 8:19 AM

## 2017-05-11 NOTE — Discharge Summary (Signed)
Physician Discharge Summary  Patient ID: Cindy Stevenson MRN: [id number] DOB/AGE: November 25, 1975 41 y.o.  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 No significant preoperative concerns. Pt fully aware that after hysterectomy , sterility is permanent uncomplicated 2 day stay.   Proposed surgery: Abdominal supracervical hysterectomy, left salpingoophorectomy, right salpingectomy.**  Consults: None  Significant Diagnostic Studies: labs:  CBC Latest Ref Rng & Units 05/10/2017 05/04/2017 08/29/2013  WBC 4.0 - 10.5 K/uL 13.3(H) 10.3 10.5  Hemoglobin 12.0 - 15.0 g/dL 11.5(L) 13.2 13.9  Hematocrit 36.0 - 46.0 % 35.7(L) 39.9 39  Platelets 150 - 400 K/uL 424(H) 510(H) -     Treatments: surgery: abdominal supracervical hysterectomy, right salpingectomy. Left tube and ovary were preserved.  Discharge Exam: Blood pressure 123/73, pulse (!) 106, temperature 98.3 F (36.8 C), temperature source Oral, resp. rate 18, height 5\' 1"  (1.549 m), weight 227 lb (103 kg), last menstrual period 04/21/2017, SpO2 99 %. General appearance: alert, cooperative and no distress GI: soft, non-tender; bowel sounds normal; no masses,  no organomegaly Extremities: extremities normal, atraumatic, no cyanosis or edema and Homans sign is negative, no sign of DVT  Disposition: 01-Home or Self Care  Discharge Instructions    Call MD for:  persistant nausea and vomiting     Complete by:  As directed    Call MD for:  temperature >100.4    Complete by:  As directed    Diet - low sodium heart healthy    Complete by:  As directed    Increase activity slowly    Complete by:  As directed      Allergies as of 05/11/2017      Reactions   Latex Dermatitis   Tears skin    Codeine Nausea And Vomiting   Can take with nausea meds   Shellfish Allergy    Breaks out in Hives   Amoxicillin Rash      Medication List    TAKE these medications   diazepam 5 MG tablet Commonly known as:  VALIUM Take 5 mg by mouth 2 (two) times daily.   DULoxetine 60 MG capsule Commonly known as:  CYMBALTA Take 60 mg by mouth at bedtime.   esomeprazole 40 MG capsule Commonly known as:  NEXIUM Take 40 mg by mouth every morning.   gabapentin 300 MG capsule Commonly known as:  NEURONTIN Take 300 mg by mouth 3 (three) times daily.   ibuprofen 600 MG tablet Commonly known as:  ADVIL,MOTRIN Take 1 tablet (600 mg total) by mouth every 6 (six) hours as needed (mild pain).   lisinopril 10 MG tablet Commonly known as:  PRINIVIL,ZESTRIL Take 10 mg by mouth daily.   ondansetron 4 MG disintegrating tablet Commonly known as:  ZOFRAN ODT Take 1 tablet (4 mg total) by mouth every 6 (six) hours as needed for nausea.   oxyCODONE-acetaminophen 5-325 MG tablet Commonly known as:  PERCOCET/ROXICET Take 1-2 tablets by mouth every 4 (four) hours as needed for severe pain (moderate to severe  pain (when tolerating fluids)).   rizatriptan 10 MG tablet Commonly known as:  MAXALT Take 10 mg by mouth as needed for migraine. May repeat in 2 hours if needed      Follow-up Information    Jonnie Kind, MD Follow up in 6 day(s).   Specialties:  Obstetrics and Gynecology, Radiology Contact information: [place] [address] [phone]           Signed: Jonnie Kind 05/11/2017, 8:26 AM

## 2017-05-11 NOTE — Discharge Instructions (Signed)
Abdominal Hysterectomy, Care After °This sheet gives you information about how to care for yourself after your procedure. Your doctor may also give you more specific instructions. If you have problems or questions, contact your doctor. °Follow these instructions at home: °Bathing °· Do not take baths, swim, or use a hot tub until your doctor says it is okay. Ask your doctor if you can take showers. You may only be allowed to take sponge baths for bathing. °· Keep the bandage (dressing) dry until your doctor says it can be taken off. °Surgical cut ( °incision) care °· Follow instructions from your doctor about how to take care of your cut from surgery. Make sure you: °? Wash your hands with soap and water before you change your bandage (dressing). If you cannot use soap and water, use hand sanitizer. °? Change your bandage as told by your doctor. °? Leave stitches (sutures), skin glue, or skin tape (adhesive) strips in place. They may need to stay in place for 2 weeks or longer. If tape strips get loose and curl up, you may trim the loose edges. Do not remove tape strips completely unless your doctor says it is okay. °· Check your surgical cut area every day for signs of infection. Check for: °? Redness, swelling, or pain. °? Fluid or blood. °? Warmth. °? Pus or a bad smell. °Activity °· Do gentle, daily exercise as told by your doctor. You may be told to take short walks every day and go farther each time. °· Do not lift anything that is heavier than 10 lb (4.5 kg), or the limit that your doctor tells you, until he or she says that it is safe. °· Do not drive or use heavy machinery while taking prescription pain medicine. °· Do not drive for 24 hours if you were given a medicine to help you relax (sedative). °· Follow your doctor's advice about exercise, driving, and general activities. Ask your doctor what activities are safe for you. °Lifestyle °· Do not douche, use tampons, or have sex for at least 6 weeks or as  told by your doctor. °· Do not drink alcohol until your doctor says it is okay. °· Drink enough fluid to keep your pee (urine) clear or pale yellow. °· Try to have someone at home with you for the first 1-2 weeks to help. °· Do not use any products that contain nicotine or tobacco, such as cigarettes and e-cigarettes. These can slow down healing. If you need help quitting, ask your doctor. °General instructions °· Take over-the-counter and prescription medicines only as told by your doctor. °· Do not take aspirin or ibuprofen. These medicines can cause bleeding. °· To prevent or treat constipation while you are taking prescription pain medicine, your doctor may suggest that you: °? Drink enough fluid to keep your urine clear or pale yellow. °? Take over-the-counter or prescription medicines. °? Eat foods that are high in fiber, such as: °§ Fresh fruits and vegetables. °§ Whole grains. °§ Beans. °? Limit foods that are high in fat and processed sugars, such as fried and sweet foods. °· Keep all follow-up visits as told by your doctor. This is important. °Contact a doctor if: °· You have chills or fever. °· You have redness, swelling, or pain around your cut. °· You have fluid or blood coming from your cut. °· Your cut feels warm to the touch. °· You have pus or a bad smell coming from your cut. °· Your cut breaks   open. °· You feel dizzy or light-headed. °· You have pain or bleeding when you pee. °· You keep having watery poop (diarrhea). °· You keep feeling sick to your stomach (nauseous) or keep throwing up (vomiting). °· You have unusual fluid (discharge) coming from your vagina. °· You have a rash. °· You have a reaction to your medicine. °· Your pain medicine does not help. °Get help right away if: °· You have a fever and your symptoms get worse all of a sudden. °· You have very bad belly (abdominal) pain. °· You are short of breath. °· You pass out (faint). °· You have pain, swelling, or redness of your  leg. °· You bleed a lot from your vagina and notice clumps of blood (clots). °Summary °· Do not take baths, swim, or use a hot tub until your doctor says it is okay. Ask your doctor if you can take showers. You may only be allowed to take sponge baths for bathing. °· Follow your doctor's advice about exercise, driving, and general activities. Ask your doctor what activities are safe for you. °· Do not lift anything that is heavier than 10 lb (4.5 kg), or the limit that your doctor tells you, until he or she says that it is safe. °· Try to have someone at home with you for the first 1-2 weeks to help. °This information is not intended to replace advice given to you by your health care provider. Make sure you discuss any questions you have with your health care provider. °Document Released: 07/12/2008 Document Revised: 09/21/2016 Document Reviewed: 09/21/2016 °Elsevier Interactive Patient Education © 2017 [employer] ° °

## 2017-05-11 NOTE — Progress Notes (Signed)
Patient is to be discharged home and in stable condition. Patient and family present for discharge teaching and verbalized understanding. All questions addressed and answered. Patient will be escorted out by staff via wheelchair.  Celestia Khat, RN

## 2017-05-15 ENCOUNTER — Telehealth: Payer: Self-pay | Admitting: *Deleted

## 2017-05-15 ENCOUNTER — Encounter: Payer: Self-pay | Admitting: Obstetrics and Gynecology

## 2017-05-15 ENCOUNTER — Ambulatory Visit (INDEPENDENT_AMBULATORY_CARE_PROVIDER_SITE_OTHER): Payer: Medicaid Other | Admitting: Obstetrics and Gynecology

## 2017-05-15 VITALS — BP 132/86 | HR 94 | Wt 232.0 lb

## 2017-05-15 DIAGNOSIS — Z9889 Other specified postprocedural states: Secondary | ICD-10-CM

## 2017-05-15 DIAGNOSIS — Z09 Encounter for follow-up examination after completed treatment for conditions other than malignant neoplasm: Secondary | ICD-10-CM

## 2017-05-15 NOTE — Progress Notes (Signed)
   Subjective:  Cindy Stevenson is a 41 y.o. female now 1 weeks status post supracervical hysterectomy. With subQ JP drain for removal today   + BM with soreness, not on colace Review of Systems Negative except    Diet:   Reg, avoiding food, over anxiety of soreness when defecating   Bowel movements : normal.  Pain is controlled with current analgesics. Medications being used: ibuprofen (OTC) and narcotic analgesics including vicodin.  Objective:  BP 132/86 (BP Location: Right Arm, Patient Position: Sitting, Cuff Size: Large)   Pulse 94   Wt 232 lb (105.2 kg)   LMP 04/21/2017   BMI 43.84 kg/m  General:Well developed, well nourished.  No acute distress. Abdomen: Bowel sounds normal, soft, non-tender. Pelvic Exam:    External Genitalia:  Normal.    Vagina: Normal    Cervix: Normal   Uterus absent    Adnexa/Bimanual: not checked  Incision(s):   Healing adeqiate. wotj slight briusing, no drainage, no erythema, no hernia, no swelling, no dehiscence,     Assessment:  Post-Op 1 weeks s/p supracervical hysterectomy   JP drain removed Doing well postoperatively.   Plan:  1.Wound care discussed  sjhowering 2. . current medications.continue prn 3. Activity restrictions: no driving x 1-2 wik 4. return to work: not applicable. 5. Follow up in 3 weeks.

## 2017-05-15 NOTE — Telephone Encounter (Signed)
Patient states she forgot to ask for a refill on her pain medication when she saw you today. Please advise.

## 2017-05-17 ENCOUNTER — Encounter (HOSPITAL_COMMUNITY): Payer: Self-pay | Admitting: Obstetrics and Gynecology

## 2017-05-17 ENCOUNTER — Encounter: Payer: Medicaid Other | Admitting: Obstetrics and Gynecology

## 2017-05-19 ENCOUNTER — Other Ambulatory Visit: Payer: Self-pay | Admitting: Obstetrics and Gynecology

## 2017-05-19 DIAGNOSIS — G8918 Other acute postprocedural pain: Secondary | ICD-10-CM

## 2017-05-19 MED ORDER — TRAMADOL HCL 50 MG PO TABS: 50.0000 mg | ORAL_TABLET | Freq: Four times a day (QID) | ORAL | 0 refills | Status: DC | PRN

## 2017-05-19 NOTE — Progress Notes (Signed)
refil Rx Tramadol

## 2017-05-22 ENCOUNTER — Telehealth: Payer: Self-pay | Admitting: Obstetrics and Gynecology

## 2017-05-22 NOTE — Telephone Encounter (Signed)
Spoke with pt letting her know Dr. Carmell Austria ordered Tramadol 50 mg take 1 tab by mouth every 6 hours as needed for moderate pain or severe pain #30 with no refills. Pt to pick up at front desk. [place]

## 2017-05-22 NOTE — Telephone Encounter (Signed)
Patient called stating that she called last week regarding a refill of her pain medication. Patient states that she is in some pain of the area. Patient states that she would like a call back from the nurse or Dr. Glo Herring. Please contact ot

## 2017-05-23 MED ORDER — TRAMADOL HCL 50 MG PO TABS: 50.0000 mg | ORAL_TABLET | Freq: Four times a day (QID) | ORAL | 0 refills | Status: DC | PRN

## 2017-05-23 NOTE — Telephone Encounter (Signed)
Patient needs a few postop mild analgesics, will Rx Tramadol x 30 pills.

## 2017-06-05 ENCOUNTER — Encounter: Payer: Medicaid Other | Admitting: Obstetrics and Gynecology

## 2017-06-05 ENCOUNTER — Encounter: Payer: Self-pay | Admitting: Obstetrics and Gynecology

## 2017-06-05 ENCOUNTER — Ambulatory Visit (INDEPENDENT_AMBULATORY_CARE_PROVIDER_SITE_OTHER): Payer: Medicaid Other | Admitting: Obstetrics and Gynecology

## 2017-06-05 VITALS — BP 120/82 | HR 100 | Wt 222.8 lb

## 2017-06-05 DIAGNOSIS — Z9889 Other specified postprocedural states: Secondary | ICD-10-CM

## 2017-06-05 DIAGNOSIS — Z09 Encounter for follow-up examination after completed treatment for conditions other than malignant neoplasm: Secondary | ICD-10-CM

## 2017-06-05 NOTE — Progress Notes (Signed)
   Subjective:  Cindy Stevenson is a 41 y.o. female now 3.5 weeks status post hysterectomy supracervical abd., and bilateral salpingectomy    Review of Systems Negative    Diet:   normal   Bowel movements : normal.  Pain is controlled with current analgesics. Medications being used: ibuprofen (OTC) and narcotic analgesics including vicodin, and tramadol  Objective:  BP 120/82 (BP Location: Right Arm, Patient Position: Sitting, Cuff Size: Large)   Pulse 100   Wt 222 lb 12.8 oz (101.1 kg)   LMP 04/21/2017   BMI 42.10 kg/m  General:Well developed, well nourished.  No acute distress. Abdomen: Bowel sounds normal, soft, non-tender. Pelvic Exam:    External Genitalia:  Normal.    Vagina: Normal    Cervix: Normal    Uterus: Absent    Adnexa/Bimanual: Absent  Incision(s):   Healing well, no drainage, no erythema, no hernia, no swelling, no dehiscence,     Assessment:  Post-Op 3.5 weeks s/p hysterectomy supracervical abd., and bilateral salpingectomy   Doing well postoperatively.   Plan:  1.Wound care discussed, remove left over steristrips 2. current medications: continue PRN 3. Activity restrictions: start walking small distances 4. return to work: not applicable. 5. Follow up PRN   By signing my name below, I, Izna Ahmed, attest that this documentation has been prepared under the direction and in the presence of Jonnie Kind, MD. Electronically Signed: Jabier Gauss, Medical Scribe. 06/05/17. 4:38 PM.  I personally performed the services described in this documentation, which was SCRIBED in my presence. The recorded information has been reviewed and considered accurate. It has been edited as necessary during review. Jonnie Kind, MD

## 2017-06-06 ENCOUNTER — Telehealth: Payer: Self-pay | Admitting: *Deleted

## 2017-06-06 NOTE — Telephone Encounter (Signed)
Patient states that after she was seen yesterday for her post op appointment with Dr Glo Herring, she started having cramping and discomfort, took Tramadol and started itching and slept all day. She is out of Percocet and would like a refill. She cannot take Ibuprofen per GI doctor. Informed patient that prescription would need to be picked up and we close at 5pm. States she would not be able to pick up before 5. Is there anything else that can be sent in? Please advise.

## 2017-06-06 NOTE — Telephone Encounter (Signed)
No there is not anything else that would be helpful, please send this message to Dr Glo Herring and he can address it tomorrow, maybe she can come pick it up then

## 2017-06-07 ENCOUNTER — Telehealth: Payer: Self-pay | Admitting: Obstetrics and Gynecology

## 2017-06-07 MED ORDER — OXYCODONE-ACETAMINOPHEN 5-325 MG PO TABS: 1.0000 | ORAL_TABLET | Freq: Four times a day (QID) | ORAL | 0 refills | Status: DC | PRN

## 2017-06-07 NOTE — Telephone Encounter (Signed)
reflled percocet

## 2017-06-07 NOTE — Telephone Encounter (Signed)
Informed patient that Dr Glo Herring will refill medication. States her aunt April Stacy will pick up today or tomorrow.

## 2017-06-07 NOTE — Telephone Encounter (Signed)
LMOVM that there was nothing else to do for the pain at this time. Dr Glo Herring in the office today so if she still needed pain medication, to let us know so she could pick up before 5pm today.

## 2017-12-28 ENCOUNTER — Telehealth: Payer: Self-pay | Admitting: Adult Health

## 2017-12-28 MED ORDER — FLUCONAZOLE 150 MG PO TABS: ORAL_TABLET | ORAL | 1 refills | Status: DC

## 2017-12-28 NOTE — Telephone Encounter (Signed)
Patient called stating that she would like for [facility] to call her in [place], [place] states that she has tried over the counter medication but it is not working. Please contact pt

## 2017-12-28 NOTE — Telephone Encounter (Signed)
Pt complains of yeast, tried OTC, wants diflucan will do

## 2018-04-06 ENCOUNTER — Ambulatory Visit: Payer: Medicaid Other | Admitting: Neurology

## 2018-06-11 ENCOUNTER — Encounter: Payer: Self-pay | Admitting: Neurology

## 2018-06-11 ENCOUNTER — Other Ambulatory Visit: Payer: Self-pay

## 2018-06-11 ENCOUNTER — Ambulatory Visit: Payer: Medicaid Other | Admitting: Neurology

## 2018-06-11 ENCOUNTER — Telehealth: Payer: Self-pay | Admitting: *Deleted

## 2018-06-11 VITALS — BP 125/85 | HR 101 | Ht 61.0 in | Wt 222.5 lb

## 2018-06-11 DIAGNOSIS — G43019 Migraine without aura, intractable, without status migrainosus: Secondary | ICD-10-CM | POA: Diagnosis not present

## 2018-06-11 DIAGNOSIS — M5441 Lumbago with sciatica, right side: Secondary | ICD-10-CM

## 2018-06-11 DIAGNOSIS — G8929 Other chronic pain: Secondary | ICD-10-CM

## 2018-06-11 HISTORY — DX: Migraine without aura, intractable, without status migrainosus: G43.019

## 2018-06-11 MED ORDER — FREMANEZUMAB-VFRM 225 MG/1.5ML ~~LOC~~ SOSY: 225.0000 mg | PREFILLED_SYRINGE | SUBCUTANEOUS | 4 refills | Status: DC

## 2018-06-11 MED ORDER — NARATRIPTAN HCL 2.5 MG PO TABS: 2.5000 mg | ORAL_TABLET | Freq: Two times a day (BID) | ORAL | 3 refills | Status: DC | PRN

## 2018-06-11 NOTE — Telephone Encounter (Signed)
Faxed completed/signed PA naratriptan to [facility]. Waiting on determination.

## 2018-06-11 NOTE — Patient Instructions (Signed)
We will start Ajovy for the headache, use Amerge 2.5 mg twice daily if needed for the headache.

## 2018-06-11 NOTE — Progress Notes (Signed)
Reason for visit: Migraine headache, fibromyalgia, low back pain  Referring physician: Dr. Jaynee Eagles is a 42 y.o. female  History of present illness:  [name] is a 42 year old right-handed white female with a history of obesity, diabetes, depression, and fibromyalgia.  The patient has a history of migraine headaches since she was 42 years old, the headaches may start over the right or left eye, then migrate to the other side.  The patient will have photophobia and phonophobia, nausea and vomiting with a headache.  The headache generally lasts about 3 days, the patient may have 2 or 3 such headaches a month, she may have up to 9 days with headache a month, she claims that the headaches are incapacitating.  She does not work.  The patient has an underlying history of depression and generalized neuromuscular discomfort felt secondary to fibromyalgia.  She reports pain in her low back on the right side that comes on with standing, the pain will go down to the knee and then she may get tingling into the foot.  The pain is better when she is sitting or lying down.  The patient indicates that her headaches may be brought on by certain odors such as perfumes, she has chronic insomnia.  She reports some dizziness associated with a headache and some neck pain associated with a headache.  She has fallen on occasion, she denies issues controlling the bowels or the bladder.  She has been seen by Dr. Lily Lovings in the past.  She has been treated with Prozac, amitriptyline, Paxil, Topamax, gabapentin, and currently Cymbalta for the headache without much benefit.  She takes Excedrin Migraine, Motrin, she has taken Imitrex and Maxalt without significant improvement in the headache.  The patient is sent to this office for an evaluation.  Past Medical History:  Diagnosis Date  . Anxiety   . Breast nodule 08/06/2015  . Breast pain, left 08/06/2015  . Burning with urination 02/27/2014  . Common  migraine with intractable migraine 06/11/2018  . Depression   . Diabetes mellitus without complication ([place])   . Dysmenorrhea 02/27/2014  . 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 GERD (gastroesophageal reflux disease)   . Headache   . Hematuria 02/27/2014  . Hypertension   . PTSD (post-traumatic stress disorder)   . Screening for STD (sexually transmitted disease) 03/27/2014  . Seasonal allergies   . SUI (stress urinary incontinence, female) 02/27/2014  . Vaginal discharge 08/06/2015  . Warts, genital 01/23/2015  . Yeast infection 08/06/2015    Past Surgical History:  Procedure Laterality Date  . BILATERAL SALPINGECTOMY Bilateral 05/09/2017   Procedure: BILATERAL SALPINGECTOMY;  Surgeon: Jonnie Kind, MD;  Location: [facility];  Service: Gynecology;  Laterality: Bilateral;  . COLONOSCOPY N/A 09/10/2013   DZH:GDJME;/QASTM internal hemorrhoids  . ESOPHAGOGASTRODUODENOSCOPY N/A 09/10/2013   HDQ:QIWLNLGX ring at the gastroesophagral juctions/mild non-erosive gastritis  . LABIOPLASTY  02/14/2012   Procedure: LABIAPLASTY;  Surgeon: Jonnie Kind, MD;  Location: [facility];  Service: Gynecology;  Laterality: Right;  of the right labia minora  . SUPRACERVICAL ABDOMINAL HYSTERECTOMY N/A 05/09/2017   Procedure: HYSTERECTOMY SUPRACERVICAL ABDOMINAL;  Surgeon: Jonnie Kind, MD;  Location: [facility];  Service: Gynecology;  Laterality: N/A;  . WISDOM TOOTH EXTRACTION  1997   Dr. Owens Shark    Family History  Problem Relation Age of Onset  . Lung cancer Mother   . Asthma Sister   . Allergies Sister   . Mental illness Maternal Aunt  depression, PTSD  . Diabetes Maternal Grandmother   . Cancer Maternal Grandfather        skin  . Colon cancer Neg Hx     Social history:  reports that she has never smoked. She has never used smokeless tobacco. She reports that she does not drink alcohol or use drugs.  Medications:  Prior to Admission medications   Medication Sig Start Date End Date Taking?  Authorizing Provider  ALPRAZolam Duanne Moron) 1 MG tablet Take 1 mg by mouth 3 (three) times daily.   Yes [provider]  DULoxetine (CYMBALTA) 60 MG capsule Take 60 mg by mouth at bedtime.    Yes [provider]  esomeprazole (NEXIUM) 40 MG capsule Take 40 mg by mouth every morning.    Yes [provider]  losartan (COZAAR) 50 MG tablet Take 50 mg by mouth daily.   Yes [provider]  ondansetron (ZOFRAN ODT) 4 MG disintegrating tablet Take 1 tablet (4 mg total) by mouth every 6 (six) hours as needed for nausea. 05/11/17  Yes Jonnie Kind, MD  Fremanezumab-vfrm (AJOVY) 225 MG/1.5ML SOSY Inject 225 mg into the skin every 30 (thirty) days. 06/11/18   Kathrynn Ducking, MD  naratriptan (AMERGE) 2.5 MG tablet Take 1 tablet (2.5 mg total) by mouth 2 (two) times daily as needed for migraine. 06/11/18   Kathrynn Ducking, MD      Allergies  Allergen Reactions  . Latex Dermatitis    Tears skin   . Codeine Nausea And Vomiting    Can take with nausea meds  . Shellfish Allergy     Breaks out in Hives  . Amoxicillin Rash    ROS:  Out of a complete 14 system review of symptoms, the patient complains only of the following symptoms, and all other reviewed systems are negative.  Weight gain, fatigue Swelling in the legs Ringing in the ears, dizziness Moles Blurred vision, eye pain Wheezing, snoring Easy bleeding Feeling hot, increased thirst, flushing Muscle cramps, aching muscles Allergies, skin sensitivity Memory loss, confusion, headache, numbness, weakness, dizziness Depression, anxiety, not enough sleep, decreased energy, disinterest in activities, racing thoughts Insomnia, sleepiness, restless legs  Blood pressure 125/85, pulse (!) 101, height 5\' 1"  (1.549 m), weight 222 lb 8 oz (100.9 kg), last menstrual period 04/21/2017, SpO2 98 %.  Physical Exam  General: The patient is alert and cooperative at the time of the examination.  The patient is  markedly obese.  Eyes: Pupils are equal, round, and reactive to light. Discs are flat bilaterally.  Neck: The neck is supple, no carotid bruits are noted.  Respiratory: The respiratory examination is clear.  Cardiovascular: The cardiovascular examination reveals a regular rate and rhythm, no obvious murmurs or rubs are noted.  Neuromuscular: Range move the cervical spine is full.  No crepitus is noted in the temporomandibular joints.  Skin: Extremities are without significant edema.  Neurologic Exam  Mental status: The patient is alert and oriented x 3 at the time of the examination. The patient has apparent normal recent and remote memory, with an apparently normal attention span and concentration ability.  Cranial nerves: Facial symmetry is present. There is good sensation of the face to pinprick and soft touch bilaterally. The strength of the facial muscles and the muscles to head turning and shoulder shrug are normal bilaterally. Speech is well enunciated, no aphasia or dysarthria is noted. Extraocular movements are full. Visual fields are full. The tongue is midline, and the patient has  symmetric elevation of the soft palate. No obvious hearing deficits are noted.  Motor: The motor testing reveals 5 over 5 strength of all 4 extremities. Good symmetric motor tone is noted throughout.  Sensory: Sensory testing is intact to pinprick, soft touch, vibration sensation, and position sense on all 4 extremities. No evidence of extinction is noted.  Coordination: Cerebellar testing reveals good finger-nose-finger and heel-to-shin bilaterally.  Gait and station: Gait is normal. Tandem gait is normal. Romberg is negative. No drift is seen.  Reflexes: Deep tendon reflexes are symmetric and normal bilaterally. Toes are downgoing bilaterally.   Assessment/Plan:  1.  Common migraine headache, intractable  2.  Fibromyalgia  3.  Chronic low back pain, right-sided sciatica  4.  History of  depression  5.  Chronic insomnia  The patient has prolonged headaches, each headache lasting about 3 days.  The Imitrex and Maxalt do not last long enough to significantly improve the headache.  She will be taken off of Maxalt and switch to Amerge taking 1 tablet twice daily during the headache.  She will be given a trial on Ajovy injectable.  She is not a good candidate for beta-blocker medications given the history of depression, and she is not a good candidate for Depakote given the history of obesity.  Given the low back pain, the patient will undergo nerve conduction studies of both legs and EMG on the right leg.  If this is also unremarkable, we may undergo MRI of the lumbar spine, patient could potentially have a facet joint arthritis pain or a SI joint dysfunction syndrome.  She will otherwise a follow-up in 3 months.  Seroquel may be used in the future to help her rest better and to help the headaches.  Jill Alexanders MD 06/11/2018 2:49 PM  [facility] [address] [place], [place] [address]  Phone [phone] Fax [phone]

## 2018-06-11 NOTE — Telephone Encounter (Addendum)
Printed PA forms from [employer] to complete for naratriptan and ajovy. In process of completing.

## 2018-06-11 NOTE — Telephone Encounter (Signed)
Faxed completed/signed PA ajovy to [facility] at [phone]. Received fax confirmation. Waiting on determination.

## 2018-06-12 NOTE — Telephone Encounter (Addendum)
[place] Ajovy 225mg /0.13mL approved for 3 months effective 06/12/18. KH#[id number].  I called [facility] and spoke with Temeka to get this info. Conf# for call: [id number]. Faxed notice of approval to [facility] [place], [place] at [phone]. Received fax confirmation.

## 2018-06-12 NOTE — Telephone Encounter (Signed)
PA naratriptan approved 06/12/18-06/07/19. FY#[id number]. Faxed notice of approval to [facility] [place] at [phone]. Received fax confirmation.

## 2018-07-09 ENCOUNTER — Encounter: Payer: Self-pay | Admitting: Neurology

## 2018-07-09 ENCOUNTER — Ambulatory Visit: Payer: Medicaid Other | Admitting: Neurology

## 2018-07-09 ENCOUNTER — Ambulatory Visit (INDEPENDENT_AMBULATORY_CARE_PROVIDER_SITE_OTHER): Payer: Medicaid Other | Admitting: Neurology

## 2018-07-09 ENCOUNTER — Telehealth: Payer: Self-pay | Admitting: Neurology

## 2018-07-09 DIAGNOSIS — M5431 Sciatica, right side: Secondary | ICD-10-CM | POA: Diagnosis not present

## 2018-07-09 DIAGNOSIS — M5441 Lumbago with sciatica, right side: Secondary | ICD-10-CM

## 2018-07-09 DIAGNOSIS — G8929 Other chronic pain: Secondary | ICD-10-CM

## 2018-07-09 DIAGNOSIS — G43019 Migraine without aura, intractable, without status migrainosus: Secondary | ICD-10-CM

## 2018-07-09 NOTE — Progress Notes (Addendum)
Patient comes in for EMG nerve conduction study evaluation today.  She has complained of some worsening back pain and pain down the leg, she is now having symptoms into the foot and tingling into the second toe.  The patient will be set up for MRI of the lumbar spine.  EMG and nerve conduction study done today was unremarkable.  The patient could potentially have a facet joint arthritis syndrome or a right SI joint dysfunction problem.      [place]    Nerve / Sites Muscle Latency Ref. Amplitude Ref. Rel Amp Segments Distance Velocity Ref. Area    ms ms mV mV %  cm m/s m/s mVms  R Peroneal - EDB     Ankle EDB 4.1 ?6.5 3.6 ?2.0 100 Ankle - EDB 9   13.9     Fib head EDB 9.9  3.7  103 Fib head - Ankle 29 50 ?44 13.5     Pop fossa EDB 11.9  3.9  105 Pop fossa - Fib head 10 51 ?44 15.5         Pop fossa - Ankle      L Peroneal - EDB     Ankle EDB 3.5 ?6.5 6.2 ?2.0 100 Ankle - EDB 9   24.9     Fib head EDB 9.4  5.8  92.6 Fib head - Ankle 29 49 ?44 23.0     Pop fossa EDB 11.4  5.7  97.9 Pop fossa - Fib head 10 51 ?44 22.0         Pop fossa - Ankle      R Tibial - AH     Ankle AH 3.5 ?5.8 13.0 ?4.0 100 Ankle - AH 9   27.1     Pop fossa AH 12.1  10.0  77.1 Pop fossa - Ankle 36 42 ?41 23.9  L Tibial - AH     Ankle AH 3.5 ?5.8 15.7 ?4.0 100 Ankle - AH 9   30.6     Pop fossa AH 12.0  12.7  80.8 Pop fossa - Ankle 36 42 ?41 29.5             [facility]    Nerve / Sites Rec. Site Peak Lat Ref.  Amp Ref. Segments Distance    ms ms V V  cm  R Sural - Ankle (Calf)     Calf Ankle 4.0 ?4.4 10 ?6 Calf - Ankle 14  L Sural - Ankle (Calf)     Calf Ankle 3.9 ?4.4 14 ?6 Calf - Ankle 14  R Superficial peroneal - Ankle     Lat leg Ankle 3.7 ?4.4 8 ?6 Lat leg - Ankle 14  L Superficial peroneal - Ankle     Lat leg Ankle 3.4 ?4.4 8 ?6 Lat leg - Ankle 14              F  Wave    Nerve F Lat Ref.   ms ms  R Tibial - AH 43.9 ?56.0  L Tibial - AH 49.4 ?56.0

## 2018-07-09 NOTE — Progress Notes (Signed)
Please refer to EMG and nerve conduction procedure note.  

## 2018-07-09 NOTE — Telephone Encounter (Signed)
Medicaid order sent to [facility]. They obtain the auth and will reach out to the pt to schedule.  °

## 2018-07-09 NOTE — Procedures (Signed)
     HISTORY:  Cindy Stevenson is a 42 year old patient with a history of obesity who reports right-sided back pain and discomfort down the right leg to the foot.  She is being evaluated for a possible lumbosacral radiculopathy.  NERVE CONDUCTION STUDIES:  Nerve conduction studies were performed on both lower extremities. The distal motor latencies and motor amplitudes for the peroneal and posterior tibial nerves were within normal limits. The nerve conduction velocities for these nerves were also normal. The sensory latencies for the peroneal and sural nerves were within normal limits. The F wave latencies for the posterior tibial nerves were within normal limits.   EMG STUDIES:  EMG study was performed on the right lower extremity:  The tibialis anterior muscle reveals 2 to 4K motor units with full recruitment. No fibrillations or positive waves were seen. The peroneus tertius muscle reveals 2 to 4K motor units with full recruitment. No fibrillations or positive waves were seen. The medial gastrocnemius muscle reveals 1 to 3K motor units with full recruitment. No fibrillations or positive waves were seen. The vastus lateralis muscle reveals 2 to 4K motor units with full recruitment. No fibrillations or positive waves were seen. The iliopsoas muscle reveals 2 to 4K motor units with full recruitment. No fibrillations or positive waves were seen. The biceps femoris muscle (long head) reveals 2 to 4K motor units with full recruitment. No fibrillations or positive waves were seen. The lumbosacral paraspinal muscles were tested at 3 levels, and revealed no abnormalities of insertional activity at all 3 levels tested. There was good relaxation.   IMPRESSION:  Nerve conduction studies on both lower extremities were within normal limits.  No evidence of a peripheral neuropathy was seen.  EMG evaluation of the right lower extremity was unremarkable without evidence of an overlying lumbosacral  radiculopathy.  Jill Alexanders MD 07/09/2018 11:16 AM  [facility] [address] [place], [place] [address]  Phone [phone] Fax [phone]

## 2018-09-25 ENCOUNTER — Telehealth: Payer: Self-pay

## 2018-09-25 NOTE — Telephone Encounter (Signed)
PA for Ajovy submitted to [place] via fax with confirmation received. Will follow on [place] [facility].  MB RN.

## 2018-09-26 ENCOUNTER — Ambulatory Visit (INDEPENDENT_AMBULATORY_CARE_PROVIDER_SITE_OTHER): Payer: Medicaid Other | Admitting: Neurology

## 2018-09-26 ENCOUNTER — Encounter: Payer: Self-pay | Admitting: Neurology

## 2018-09-26 VITALS — BP 146/90 | HR 103 | Ht 61.0 in | Wt 228.0 lb

## 2018-09-26 DIAGNOSIS — G43019 Migraine without aura, intractable, without status migrainosus: Secondary | ICD-10-CM

## 2018-09-26 MED ORDER — PREDNISONE 5 MG PO TABS: ORAL_TABLET | ORAL | 0 refills | Status: DC

## 2018-09-26 NOTE — Progress Notes (Signed)
Reason for visit: Migraine headache, back pain, right-sided sciatica, fibromyalgia  Cindy Stevenson is an 42 y.o. female  History of present illness:  Cindy Stevenson is a 42 year old right-handed white female with a history of intractable migraine headaches.  The patient was having 9 or 10 headaches a month, she was placed on Aimovig and over 2 months that she only had one headache.  The patient has had a few more headaches in November 2019, she is 11 days overdue for her next injection, and she has had a headache every day over the last 11 days.  The patient still has back pain, she has not yet gotten an MRI of the low back as ordered.  The patient indicates that she sleeps fairly well, she does not work.  The patient returns to the office today for further evaluation.  She remains on Cymbalta for her fibromyalgia.  Past Medical History:  Diagnosis Date  . Anxiety   . Breast nodule 08/06/2015  . Breast pain, left 08/06/2015  . Burning with urination 02/27/2014  . Common migraine with intractable migraine 06/11/2018  . Depression   . Diabetes mellitus without complication ([place])   . Dysmenorrhea 02/27/2014  . 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 GERD (gastroesophageal reflux disease)   . Headache   . Hematuria 02/27/2014  . Hypertension   . PTSD (post-traumatic stress disorder)   . Screening for STD (sexually transmitted disease) 03/27/2014  . Seasonal allergies   . SUI (stress urinary incontinence, female) 02/27/2014  . Vaginal discharge 08/06/2015  . Warts, genital 01/23/2015  . Yeast infection 08/06/2015    Past Surgical History:  Procedure Laterality Date  . BILATERAL SALPINGECTOMY Bilateral 05/09/2017   Procedure: BILATERAL SALPINGECTOMY;  Surgeon: Jonnie Kind, MD;  Location: [facility];  Service: Gynecology;  Laterality: Bilateral;  . COLONOSCOPY N/A 09/10/2013   ERD:EYCXK;/GYJEH internal hemorrhoids  . ESOPHAGOGASTRODUODENOSCOPY N/A 09/10/2013   UDJ:SHFWYOVZ ring at the  gastroesophagral juctions/mild non-erosive gastritis  . LABIOPLASTY  02/14/2012   Procedure: LABIAPLASTY;  Surgeon: Jonnie Kind, MD;  Location: [facility];  Service: Gynecology;  Laterality: Right;  of the right labia minora  . SUPRACERVICAL ABDOMINAL HYSTERECTOMY N/A 05/09/2017   Procedure: HYSTERECTOMY SUPRACERVICAL ABDOMINAL;  Surgeon: Jonnie Kind, MD;  Location: [facility];  Service: Gynecology;  Laterality: N/A;  . WISDOM TOOTH EXTRACTION  1997   Dr. Owens Shark    Family History  Problem Relation Age of Onset  . Lung cancer Mother   . Asthma Sister   . Allergies Sister   . Mental illness Maternal Aunt        depression, PTSD  . Diabetes Maternal Grandmother   . Cancer Maternal Grandfather        skin  . Colon cancer Neg Hx     Social history:  reports that she has never smoked. She has never used smokeless tobacco. She reports that she does not drink alcohol or use drugs.    Allergies  Allergen Reactions  . Latex Dermatitis    Tears skin   . Codeine Nausea And Vomiting    Can take with nausea meds  . Shellfish Allergy     Breaks out in Hives  . Amoxicillin Rash    Medications:  Prior to Admission medications   Medication Sig Start Date End Date Taking? Authorizing Provider  ALPRAZolam Duanne Moron) 1 MG tablet Take 1 mg by mouth 3 (three) times daily.   Yes [provider]  DULoxetine (CYMBALTA) 60 MG  capsule Take 60 mg by mouth at bedtime.    Yes [provider]  esomeprazole (NEXIUM) 40 MG capsule Take 40 mg by mouth every morning.    Yes [provider]  Fremanezumab-vfrm (AJOVY) 225 MG/1.5ML SOSY Inject 225 mg into the skin every 30 (thirty) days. 06/11/18  Yes Kathrynn Ducking, MD  losartan (COZAAR) 50 MG tablet Take 50 mg by mouth daily.   Yes [provider]  naratriptan (AMERGE) 2.5 MG tablet Take 1 tablet (2.5 mg total) by mouth 2 (two) times daily as needed for migraine. 06/11/18  Yes Kathrynn Ducking, MD  ondansetron (ZOFRAN ODT) 4  MG disintegrating tablet Take 1 tablet (4 mg total) by mouth every 6 (six) hours as needed for nausea. 05/11/17  Yes Jonnie Kind, MD    ROS:  Out of a complete 14 system review of symptoms, the patient complains only of the following symptoms, and all other reviewed systems are negative.  Fatigue, excessive sweating Ear discharge, runny nose Eye redness, eye pain, blurred vision Flushing Restless legs, daytime sleepiness, snoring Back pain, muscle cramps, walking difficulty, neck pain, neck stiffness Moles Memory loss, dizziness, headache, numbness Agitation, depression, anxiety  Blood pressure (!) 146/90, pulse (!) 103, height 5\' 1"  (1.549 m), weight 228 lb (103.4 kg), last menstrual period 04/21/2017.  Physical Exam  General: The patient is alert and cooperative at the time of the examination.  The patient is markedly obese.  Skin: No significant peripheral edema is noted.   Neurologic Exam  Mental status: The patient is alert and oriented x 3 at the time of the examination. The patient has apparent normal recent and remote memory, with an apparently normal attention span and concentration ability.   Cranial nerves: Facial symmetry is present. Speech is normal, no aphasia or dysarthria is noted. Extraocular movements are full. Visual fields are full.  Motor: The patient has good strength in all 4 extremities.  Sensory examination: Soft touch sensation is symmetric on the face, arms, and legs.  Coordination: The patient has good finger-nose-finger and heel-to-shin bilaterally.  Gait and station: The patient has a normal gait. Tandem gait is normal. Romberg is negative. No drift is seen.  Reflexes: Deep tendon reflexes are symmetric.   Assessment/Plan:  1.  Intractable migraine headache  2.  Low back pain, right leg pain  3.  Fibromyalgia  The patient will go back on Aimovig, this seemed to help her significantly with her headaches.  We will give her a 6-day  prednisone dose pack for her current headache.  The patient will undergo MRI of the lumbar spine, she will follow-up in 4 months.  If the MRI of the lumbar spine is unremarkable, we may consider an SI joint injection on the right.  Cindy Alexanders MD 09/26/2018 2:09 PM  [facility] [address] [place], [place] [address]  Phone [phone] Fax [phone]

## 2018-09-26 NOTE — Telephone Encounter (Signed)
PA wasn't received from 09/25/2018. PA submitted again to [facility]. Waiting on response.

## 2018-09-26 NOTE — Patient Instructions (Signed)
We will try prednisone for the headache, get back on aimovig when available.

## 2018-09-27 NOTE — Telephone Encounter (Addendum)
PA for Ajovy approved.  [place] #[id number] F Approval valid 09/26/2018 thorugh 09/21/2019. Pharmacy notified of approval.

## 2018-11-19 ENCOUNTER — Other Ambulatory Visit: Payer: Self-pay | Admitting: Neurology

## 2018-12-24 ENCOUNTER — Telehealth: Payer: Self-pay | Admitting: Adult Health

## 2018-12-24 MED ORDER — FLUCONAZOLE 150 MG PO TABS: ORAL_TABLET | ORAL | 1 refills | Status: DC

## 2018-12-24 NOTE — Addendum Note (Signed)
Addended by: Derrek Monaco A on: 12/24/2018 12:01 PM   Modules accepted: Orders

## 2018-12-24 NOTE — Telephone Encounter (Signed)
Spoke with pt. Pt just finished an antibiotic for a sinus infection and she needs Diflucan. Please advise. Thanks!!

## 2018-12-24 NOTE — Telephone Encounter (Signed)
Will rx diflucan  

## 2018-12-24 NOTE — Telephone Encounter (Signed)
Patient called stating that she would like a for Anderson Malta to call her in some Diflucan, Pt states that she has finished her Antibiotic for her Sinus infection. Please contact pt

## 2019-01-24 ENCOUNTER — Telehealth: Payer: Self-pay

## 2019-01-24 NOTE — Telephone Encounter (Signed)
Unable to get in contact with the patient to convert their office visit into a webex or telephone visit. I left them a voicemail asking to return my call. Office number was provided.   

## 2019-01-28 ENCOUNTER — Ambulatory Visit (INDEPENDENT_AMBULATORY_CARE_PROVIDER_SITE_OTHER): Payer: Medicaid Other | Admitting: Neurology

## 2019-01-28 ENCOUNTER — Other Ambulatory Visit: Payer: Self-pay

## 2019-01-28 ENCOUNTER — Encounter: Payer: Self-pay | Admitting: Neurology

## 2019-01-28 DIAGNOSIS — G43019 Migraine without aura, intractable, without status migrainosus: Secondary | ICD-10-CM

## 2019-01-28 MED ORDER — GALCANEZUMAB-GNLM 120 MG/ML ~~LOC~~ SOAJ: 240.0000 mg | SUBCUTANEOUS | 0 refills | Status: AC

## 2019-01-28 MED ORDER — PREDNISONE 5 MG PO TABS: ORAL_TABLET | ORAL | 0 refills | Status: DC

## 2019-01-28 NOTE — Telephone Encounter (Signed)
Pt called in to give consent for telephone visit

## 2019-01-28 NOTE — Progress Notes (Signed)
I have read the note, and I agree with the clinical assessment and plan.  Charles K Willis   

## 2019-01-28 NOTE — Telephone Encounter (Signed)
Noted  

## 2019-01-28 NOTE — Progress Notes (Signed)
Virtual Visit via Telephone Note  I connected with Cindy Stevenson on 01/28/19 at  2:15 PM EDT by telephone and verified that I am speaking with the correct person using two identifiers.   I discussed the limitations, risks, security and privacy concerns of performing an evaluation and management service by telephone and the availability of in person appointments. I also discussed with the patient that there may be a patient responsible charge related to this service. The patient expressed understanding and agreed to proceed.   History of Present Illness: 01/28/2019 SS: Cindy Stevenson is a 43 year old female with history of migraine headache, fibromyalgia.  She is taking Ajovy monthly injection, naratriptan for acute treatment.  At her last visit an MRI of her lumbar spine was ordered, but never completed.  In 2019 she had an EMG nerve conduction study, was unremarkable.  She reports that she will be is no longer beneficial, for the last 2 months has had daily headaches.  She reports some benefit taking naratriptan.  Her headaches occur to the top of her head, will rotate from side to side.  She reports continued back pain, most bothersome when standing, has to sit in a chair when washing dishes.  She reports she will feel the pain shooting down her right leg.  She denies any numbness or weakness to her arms or legs.  She denies any bowel or bladder problem.  In the past she has been treated with Prozac, amitriptyline, Paxil, Topamax, gabapentin, Imitrex, Maxalt.  She is currently taking Cymbalta for depression, fibromyalgia.  She is currently on disability.  09/26/2018 Dr. Jannifer Franklin: Cindy Stevenson is a 43 year old right-handed white female with a history of intractable migraine headaches.  The patient was having 9 or 10 headaches a month, she was placed on Aimovig and over 2 months that she only had one headache.  The patient has had a few more headaches in November 2019, she is 11 days overdue for her next  injection, and she has had a headache every day over the last 11 days.  The patient still has back pain, she has not yet gotten an MRI of the low back as ordered.  The patient indicates that she sleeps fairly well, she does not work.  The patient returns to the office today for further evaluation.  She remains on Cymbalta for her fibromyalgia.   Observations/Objective: Alert, clear, answers questions appropriately  Assessment and Plan: 1.  Migraine headaches 2.  Fibromyalgia 3.  Chronic low back pain, right-sided sciatica  She feels that she has lost the benefit of a Ajovy.  It was beneficial at first, would like to try another medication in this class.  I will send in a prescription for Emgality, initial dosing is 240 mg x 1, followed by 120 mg monthly.  I will send in a 6-day 5 mg prednisone taper pack to hopefully break the cycle of headache.  She has a history of diabetes, she reports good control, she will monitor her eating. She was given this in December 2019, she believes it was beneficial.  She continues to have low back pain, we will try to get the MRI when the coronavirus pandemic subsides.  We discussed possible medications for her headaches, mentioned Effexor, she is not sure if she has tried this, could be beneficial for headaches, depression, she may be interested in coming off Cymbalta.  She will discuss this with her psychiatrist.  Dr. Jannifer Franklin had mentioned in his previous note that Seroquel may be  beneficial, she reports she has tried this in the past and she was taken off this for some reason she cannot remember.  Neither candidate for beta-blocker given depression, not a good candidate for Depakote given history of obesity.  Follow Up Instructions: 3 months for revisit   I discussed the assessment and treatment plan with the patient. The patient was provided an opportunity to ask questions and all were answered. The patient agreed with the plan and demonstrated an understanding of  the instructions.   The patient was advised to call back or seek an in-person evaluation if the symptoms worsen or if the condition fails to improve as anticipated.  I provided 25 minutes of non-face-to-face time during this encounter.  Evangeline Dakin, DNP  [facility] [address], [place],  [address] [phone]

## 2019-01-29 ENCOUNTER — Telehealth: Payer: Self-pay | Admitting: Neurology

## 2019-01-29 NOTE — Telephone Encounter (Signed)
Pt states she took the Ajovy on Fri and she was prescribed the Emgality yesterday during her visit and she is wanting to know if she is ok to do the Emgality right now or does she have to wait to start this. Please advise.

## 2019-02-21 ENCOUNTER — Other Ambulatory Visit: Payer: Self-pay | Admitting: Neurology

## 2019-02-26 ENCOUNTER — Telehealth: Payer: Self-pay

## 2019-02-26 NOTE — Telephone Encounter (Signed)
Received a fax from [employer] where the patient has requested a refill. [facility] to [facility]. Confirmation fax has been received.    [facility] [phone] - [place], [place] [phone] (Phone) [phone] (Fax)

## 2019-02-28 ENCOUNTER — Other Ambulatory Visit: Payer: Self-pay

## 2019-02-28 ENCOUNTER — Telehealth: Payer: Self-pay | Admitting: *Deleted

## 2019-02-28 ENCOUNTER — Encounter: Payer: Self-pay | Admitting: Gastroenterology

## 2019-02-28 ENCOUNTER — Ambulatory Visit: Payer: Medicaid Other | Admitting: Gastroenterology

## 2019-02-28 DIAGNOSIS — R1011 Right upper quadrant pain: Secondary | ICD-10-CM

## 2019-02-28 NOTE — Progress Notes (Signed)
Patient scheduled for 3pm telehealth encounter today. I was unable to reach her by phone or text.

## 2019-02-28 NOTE — Telephone Encounter (Signed)
LVM for pt to return call for 3 mon RV with SS/NP migraines/ willis.  If she calls back ok to schedule.

## 2019-03-25 ENCOUNTER — Telehealth: Payer: Self-pay | Admitting: Neurology

## 2019-03-25 MED ORDER — GALCANEZUMAB-GNLM 120 MG/ML ~~LOC~~ SOAJ: 120.0000 mg | SUBCUTANEOUS | 5 refills | Status: DC

## 2019-03-25 MED ORDER — BUTALBITAL-APAP-CAFFEINE 50-325-40 MG PO TABS: 1.0000 | ORAL_TABLET | Freq: Four times a day (QID) | ORAL | 3 refills | Status: DC | PRN

## 2019-03-25 NOTE — Telephone Encounter (Signed)
I called the patient.  She reports that she did some research on Botox, is not sure about moving forward.  She wishes to continue taking Fioricet.  I have told her that we need to start a preventative medication for her daily headaches.  Fioricet is not a medication that can be taken often, it can be habit-forming, causing rebound headache.  I have offered her baclofen 5 mg twice daily for 2 weeks, and then increasing to 10 mg twice daily as prevention.  She indicates her depression is under good control, we could try propanolol, but she is leery to alter her blood pressure BP medication regimen.  She cannot make a decision at this time.  She will consider her options and call to let us know what she decides. She will continue Emgality monthly injections at this time, I have sent in a refill.

## 2019-03-25 NOTE — Telephone Encounter (Signed)
I called and spoke with the patient regarding Botox injections. She states that she has looked the injections up online since speak with Judson Roch and does not feel comfortable getting them because of the ingredients. She would like to know if she can just try the fioricet for now and then reconsider Botox if this doesn't help. She feels she may need more fioricet than what was given today. Please advise.

## 2019-03-25 NOTE — Addendum Note (Signed)
Addended by: Suzzanne Cloud on: 03/25/2019 02:38 PM   Modules accepted: Orders

## 2019-03-25 NOTE — Telephone Encounter (Signed)
Pt is calling in wanting to know if she can be placed on Butalbital for her migraines , she states the Emgality isnt helping her at all.

## 2019-03-25 NOTE — Telephone Encounter (Signed)
I called the patient.  She is currently taking Emgality monthly injection.  She indicates that she is having daily headache, her headaches may last for several days.  She is no longer taking Amerge, because she did not find it beneficial.  She is requesting a prescription for Fioricet, she has used this in the past and has been helpful. In the past she has been treated with Prozac, amitriptyline, Paxil, Topamax, gabapentin, Imitrex, Maxalt, Ajovy , Emgality. Amerge, Seroquel.  She is not a candidate for beta-blocker given her history of depression or Depakote given history of obesity.  At this point, we will try to get Botox set up for her daily headache.  I will send in a prescription for Fioricet, we discussed the importance of not overusing the medication, can be component of rebound headache.

## 2019-03-26 NOTE — Telephone Encounter (Signed)
Noted, thank you. DW  °

## 2019-06-11 ENCOUNTER — Other Ambulatory Visit: Payer: Self-pay | Admitting: Neurology

## 2019-06-11 ENCOUNTER — Telehealth: Payer: Self-pay

## 2019-06-11 NOTE — Telephone Encounter (Signed)
Patient just wanted to make you aware that [facility] told her that the naratriptan requires a PA.

## 2019-06-12 NOTE — Telephone Encounter (Signed)
I filled out Medicaid drug form for naratriptan PA.  To Dr. Jannifer Franklin for signature.

## 2019-06-12 NOTE — Telephone Encounter (Signed)
Noted  

## 2019-06-13 NOTE — Telephone Encounter (Signed)
Fax confirmation received from [place] [facility] [phone], [phone] ofv relating to PA for naratriptan.

## 2019-06-18 NOTE — Telephone Encounter (Signed)
[place] tracks portal checked.  Approval for naratriptan thru 06-07-2020. Confirmation [id number]. Fax confirmation received [facility] [phone].

## 2019-08-06 ENCOUNTER — Other Ambulatory Visit: Payer: Self-pay | Admitting: *Deleted

## 2019-08-06 NOTE — Telephone Encounter (Signed)
I called the patient to see how her headaches were doing. I received a refill request for Fiorcet. I would like to know how frequently she is taking Fioricet as this is not an ideal medication for frequent use. We discussed other options in our prior visit. I do not see that she is scheduled for a follow-up appointment. I left a message, asking for a call back to discuss.

## 2019-08-12 ENCOUNTER — Telehealth: Payer: Self-pay

## 2019-08-12 NOTE — Telephone Encounter (Signed)
Proficient referral - left message.

## 2019-10-01 ENCOUNTER — Ambulatory Visit: Payer: Medicaid Other | Admitting: Neurology

## 2019-10-01 ENCOUNTER — Telehealth: Payer: Self-pay | Admitting: Neurology

## 2019-10-01 ENCOUNTER — Encounter: Payer: Self-pay | Admitting: Neurology

## 2019-10-01 ENCOUNTER — Other Ambulatory Visit: Payer: Self-pay

## 2019-10-01 VITALS — BP 132/82 | HR 96 | Temp 97.8°F | Ht 61.0 in | Wt 235.2 lb

## 2019-10-01 DIAGNOSIS — G43019 Migraine without aura, intractable, without status migrainosus: Secondary | ICD-10-CM | POA: Diagnosis not present

## 2019-10-01 MED ORDER — NARATRIPTAN HCL 2.5 MG PO TABS: ORAL_TABLET | ORAL | 1 refills | Status: DC

## 2019-10-01 MED ORDER — ONDANSETRON HCL 4 MG PO TABS: 4.0000 mg | ORAL_TABLET | Freq: Three times a day (TID) | ORAL | 0 refills | Status: DC | PRN

## 2019-10-01 NOTE — Progress Notes (Signed)
PATIENT: Cindy Stevenson DOB: 05-03-1976  REASON FOR VISIT: follow up HISTORY FROM: patient  HISTORY OF PRESENT ILLNESS: Today 10/01/19  Cindy Stevenson is a 43 year old female with history of migraine headache and fibromyalgia.  She continues to complain of daily headache.  She has tried multiple preventative medications including Prozac, amitriptyline, Paxil, Topamax, gabapentin, Imitrex, Maxalt, Ajovy, Emgality, metoprolol, and naratriptan.  She reports her headache location varies, but is usually retro-orbital.  She reports nausea, photophobia, phonophobia.  At this point, her headaches impact her daily activity, she does not have much energy or drive to be very active.  She is in the process of switching antidepressants, discontinuing Cymbalta, switching to Celexa.  She says she was in the ER at [facility] in early November for tongue swelling, headache, and sinus infection.  She has been prescribed Fioricet, as she reports this works very well.  She presents today for evaluation unaccompanied.  HISTORY  01/28/2019 SS: Cindy Stevenson is a 43 year old female with history of migraine headache, fibromyalgia.  She is taking Ajovy monthly injection, naratriptan for acute treatment.  At her last visit an MRI of her lumbar spine was ordered, but never completed.  In 2019 she had an EMG nerve conduction study, was unremarkable.  She reports that she will be is no longer beneficial, for the last 2 months has had daily headaches.  She reports some benefit taking naratriptan.  Her headaches occur to the top of her head, will rotate from side to side.  She reports continued back pain, most bothersome when standing, has to sit in a chair when washing dishes.  She reports she will feel the pain shooting down her right leg.  She denies any numbness or weakness to her arms or legs.  She denies any bowel or bladder problem.  In the past she has been treated with Prozac, amitriptyline, Paxil, Topamax, gabapentin,  Imitrex, Maxalt.  She is currently taking Cymbalta for depression, fibromyalgia.  She is currently on disability.  REVIEW OF SYSTEMS: Out of a complete 14 system review of symptoms, the patient complains only of the following symptoms, and all other reviewed systems are negative.  Headache  ALLERGIES: Allergies  Allergen Reactions   Latex Dermatitis    Tears skin    Codeine Nausea And Vomiting    Can take with nausea meds   Shellfish Allergy     Breaks out in Hives   Amoxicillin Rash    HOME MEDICATIONS: Outpatient Medications Prior to Visit  Medication Sig Dispense Refill   ALPRAZolam (XANAX) 1 MG tablet Take 1 mg by mouth 3 (three) times daily.     butalbital-acetaminophen-caffeine (FIORICET) 50-325-40 MG tablet Take 1 tablet by mouth every 6 (six) hours as needed for headache. 15 tablet 3   citalopram (CELEXA) 20 MG tablet Take 20 mg by mouth daily.     esomeprazole (NEXIUM) 40 MG capsule Take 40 mg by mouth 2 (two) times daily.      fluconazole (DIFLUCAN) 150 MG tablet Take 1 now and repeat 1 in 3days 2 tablet 1   Metoprolol Tartrate (LOPRESSOR PO) Take by mouth.     naratriptan (AMERGE) 2.5 MG tablet TAKE 1 TABLET(2.5 MG) BY MOUTH TWICE DAILY AS NEEDED FOR MIGRAINE 10 tablet 0   DULoxetine (CYMBALTA) 60 MG capsule Take 60 mg by mouth at bedtime.      Galcanezumab-gnlm (EMGALITY) 120 MG/ML SOAJ Inject 120 mg into the skin every 30 (thirty) days. (Patient not taking: Reported on 10/01/2019)  1 pen 5   losartan (COZAAR) 50 MG tablet Take 50 mg by mouth daily.     No facility-administered medications prior to visit.    PAST MEDICAL HISTORY: Past Medical History:  Diagnosis Date   Anxiety    Breast nodule 08/06/2015   Breast pain, left 08/06/2015   Burning with urination 02/27/2014   Common migraine with intractable migraine 06/11/2018   Depression    Diabetes mellitus without complication ([place])    Dysmenorrhea 02/27/2014   Fibroadenoma of right breast  08/26/2015   GERD (gastroesophageal reflux disease)    Headache    Hematuria 02/27/2014   Hypertension    PTSD (post-traumatic stress disorder)    Screening for STD (sexually transmitted disease) 03/27/2014   Seasonal allergies    SUI (stress urinary incontinence, female) 02/27/2014   Vaginal discharge 08/06/2015   Warts, genital 01/23/2015   Yeast infection 08/06/2015    PAST SURGICAL HISTORY: Past Surgical History:  Procedure Laterality Date   BILATERAL SALPINGECTOMY Bilateral 05/09/2017   Procedure: BILATERAL SALPINGECTOMY;  Surgeon: Jonnie Kind, MD;  Location: [facility];  Service: Gynecology;  Laterality: Bilateral;   COLONOSCOPY N/A 09/10/2013   [id number] internal hemorrhoids   ESOPHAGOGASTRODUODENOSCOPY N/A 09/10/2013   [id number] ring at the gastroesophagral juctions/mild non-erosive gastritis   LABIOPLASTY  02/14/2012   Procedure: LABIAPLASTY;  Surgeon: Jonnie Kind, MD;  Location: [facility];  Service: Gynecology;  Laterality: Right;  of the right labia minora   SUPRACERVICAL ABDOMINAL HYSTERECTOMY N/A 05/09/2017   Procedure: HYSTERECTOMY SUPRACERVICAL ABDOMINAL;  Surgeon: Jonnie Kind, MD;  Location: [facility];  Service: Gynecology;  Laterality: N/A;   WISDOM TOOTH EXTRACTION  1997   Dr. Owens Shark    FAMILY HISTORY: Family History  Problem Relation Age of Onset   Lung cancer Mother    Asthma Sister    Allergies Sister    Mental illness Maternal Aunt        depression, PTSD   Diabetes Maternal Grandmother    Cancer Maternal Grandfather        skin   Colon cancer Neg Hx     SOCIAL HISTORY: Social History   Socioeconomic History   Marital status: Single    Spouse name: Not on file   Number of children: Not on file   Years of education: Not on file   Highest education level: Not on file  Occupational History   Occupation: disability    Employer: NOT EMPLOYED  Tobacco Use   Smoking status: Never Smoker   Smokeless tobacco:  Never Used  Substance and Sexual Activity   Alcohol use: No   Drug use: No   Sexual activity: Not Currently    Birth control/protection: None  Other Topics Concern   Not on file  Social History Narrative   Not on file   Social Determinants of Health   Financial Resource Strain:    Difficulty of Paying Living Expenses: Not on file  Food Insecurity:    Worried About Charity fundraiser in the Last Year: Not on file   [employer] of Food in the Last Year: Not on file  Transportation Needs:    Lack of Transportation (Medical): Not on file   Lack of Transportation (Non-Medical): Not on file  Physical Activity:    Days of Exercise per Week: Not on file   Minutes of Exercise per Session: Not on file  Stress:    Feeling of Stress : Not on file  Social Connections:  Frequency of Communication with Friends and Family: Not on file   Frequency of Social Gatherings with Friends and Family: Not on file   Attends Religious Services: Not on file   Active Member of Clubs or Organizations: Not on file   Attends Archivist Meetings: Not on file   Marital Status: Not on file  Intimate Partner Violence:    Fear of Current or Ex-Partner: Not on file   Emotionally Abused: Not on file   Physically Abused: Not on file   Sexually Abused: Not on file      PHYSICAL EXAM  Vitals:   10/01/19 1537  BP: 132/82  Pulse: 96  Temp: 97.8 F (36.6 C)  Weight: 235 lb 3.2 oz (106.7 kg)  Height: 5\' 1"  (1.549 m)   Body mass index is 44.44 kg/m.  Generalized: Well developed, in no acute distress   Neurological examination  Mentation: Alert oriented to time, place, history taking. Follows all commands speech and language fluent Cranial nerve II-XII: Pupils were equal round reactive to light. Extraocular movements were full, visual field were full on confrontational test. Facial sensation and strength were normal. Head turning and shoulder shrug  were normal and  symmetric. Motor: The motor testing reveals 5 over 5 strength of all 4 extremities. Good symmetric motor tone is noted throughout.  Sensory: Sensory testing is intact to soft touch on all 4 extremities. No evidence of extinction is noted.  Coordination: Cerebellar testing reveals good finger-nose-finger and heel-to-shin bilaterally.  Gait and station: Gait is normal. Tandem gait is normal. Romberg is negative. No drift is seen.  Reflexes: Deep tendon reflexes are symmetric and normal bilaterally.   DIAGNOSTIC DATA (LABS, IMAGING, TESTING) - I reviewed patient records, labs, notes, testing and imaging myself where available.  Lab Results  Component Value Date   WBC 13.3 (H) 05/10/2017   HGB 11.5 (L) 05/10/2017   HCT 35.7 (L) 05/10/2017   MCV 91.1 05/10/2017   PLT 424 (H) 05/10/2017      Component Value Date/Time   NA 134 (L) 05/10/2017 0604   K 3.9 05/10/2017 0604   CL 98 (L) 05/10/2017 0604   CO2 28 05/10/2017 0604   GLUCOSE 118 (H) 05/10/2017 0604   BUN 13 05/10/2017 0604   CREATININE 0.83 05/10/2017 0604   CALCIUM 8.2 (L) 05/10/2017 0604   PROT 6.6 05/04/2017 1000   ALBUMIN 3.9 05/04/2017 1000   AST 21 05/04/2017 1000   AST 13 08/24/2013 0000   ALT 18 05/04/2017 1000   ALKPHOS 70 05/04/2017 1000   ALKPHOS 79 08/24/2013 0000   BILITOT 0.6 05/04/2017 1000   BILITOT 1.2 08/24/2013 0000   GFRNONAA >60 05/10/2017 0604   GFRAA >60 05/10/2017 0604   No results found for: CHOL, HDL, LDLCALC, LDLDIRECT, TRIG, CHOLHDL No results found for: HGBA1C No results found for: VITAMINB12 Lab Results  Component Value Date   TSH 2.651 02/27/2014      ASSESSMENT AND PLAN 43 y.o. year old female  has a past medical history of Anxiety, Breast nodule (08/06/2015), Breast pain, left (08/06/2015), Burning with urination (02/27/2014), Common migraine with intractable migraine (06/11/2018), Depression, Diabetes mellitus without complication ([place]), Dysmenorrhea (02/27/2014), Fibroadenoma of right  breast (08/26/2015), GERD (gastroesophageal reflux disease), Headache, Hematuria (02/27/2014), Hypertension, PTSD (post-traumatic stress disorder), Screening for STD (sexually transmitted disease) (03/27/2014), Seasonal allergies, SUI (stress urinary incontinence, female) (02/27/2014), Vaginal discharge (08/06/2015), Warts, genital (01/23/2015), and Yeast infection (08/06/2015). here with:  1.  Chronic migraine headache  She continues  to complain of daily headache.  She has been on a multitude of medications for her headache.  She is not a candidate for beta-blocker given her history of depression, not a candidate for Depakote given her history of obesity.  I will try to get her set up for Botox therapy.  I will refill naratriptan, Zofran to take for headache.  She says she was in the ED in November for tongue swelling, headache, had a CT scan of the head.  I will try to get the records from [facility].  She will follow-up in 6 months or sooner if needed.  I did advise if her symptoms worsen or she develops any new symptoms she should let us know.  I spent 15 minutes with the patient. 50% of this time was spent discussing her plan of care.  Butler Denmark, AGNP-C, DNP 10/01/2019, 3:46 PM [facility] [address] [place], [place] [address] [phone]

## 2019-10-01 NOTE — Telephone Encounter (Signed)
Please try to get the patient set up for Botox therapy.  Please see recent note for tried and failed medications.

## 2019-10-01 NOTE — Patient Instructions (Signed)
We will get you started on Botox therapy, continue naratriptan as needed. I will also send in zofran as needed for nausea.   I will see you for botox, otherwise 6 months

## 2019-10-02 ENCOUNTER — Telehealth: Payer: Self-pay | Admitting: Neurology

## 2019-10-02 NOTE — Progress Notes (Signed)
I have read the note, and I agree with the clinical assessment and plan.  Quamel Fitzmaurice K Anahy Esh   

## 2019-10-02 NOTE — Telephone Encounter (Signed)
I received records from her ER visit at [facility] on August 19, 2019, she went to the ER complaining of a headache, tongue swelling, and difficulty speaking.,  WBC was elevated 15.4.  She was treated with dexamethasone, Toradol, normal saline, Fioricet, she was diagnosed with sinusitis given a prescription for Augmentin and Fioricet.  CT scan of the brain did not show acute intracranial finding.

## 2019-10-04 NOTE — Telephone Encounter (Signed)
Is there an apt time earlier than Feb I can offer her?

## 2019-10-07 NOTE — Telephone Encounter (Signed)
I called to schedule but she did not answer so I left a VM asking her to call me back.

## 2019-10-07 NOTE — Telephone Encounter (Signed)
You can offer her one of [facility] virtual slots for her botox.

## 2019-11-20 ENCOUNTER — Telehealth: Payer: Self-pay

## 2019-11-20 MED ORDER — BOTOX 100 UNITS IJ SOLR: 155.0000 [IU] | INTRAMUSCULAR | 3 refills | Status: DC

## 2019-11-20 NOTE — Telephone Encounter (Signed)
Could you send script for Botox 100 unit vial qty of 2 name brand only to [facility] in?

## 2019-11-20 NOTE — Telephone Encounter (Signed)
Done

## 2019-11-21 ENCOUNTER — Telehealth: Payer: Self-pay

## 2019-11-21 NOTE — Telephone Encounter (Signed)
I called and spoke with the patient, to let her know we are using samples for her visit Monday 11/25/19. DWD

## 2019-11-21 NOTE — Telephone Encounter (Signed)
This patient met the guidelines and requirements for Botox through Medicaid, but the designated SP through Medicaid ([facility]) is no longer filling Botox medication. We are working with [facility] currently to find an alternative way to fill the medication, since Medicaid will not pay for buy and bill but we dont currently have a solution, please advise.

## 2019-11-25 ENCOUNTER — Ambulatory Visit: Payer: Medicaid Other | Admitting: Neurology

## 2019-11-25 ENCOUNTER — Other Ambulatory Visit: Payer: Self-pay

## 2019-11-25 DIAGNOSIS — G43019 Migraine without aura, intractable, without status migrainosus: Secondary | ICD-10-CM | POA: Diagnosis not present

## 2019-11-25 MED ORDER — NARATRIPTAN HCL 2.5 MG PO TABS: ORAL_TABLET | ORAL | 1 refills | Status: DC

## 2019-11-25 MED ORDER — ONDANSETRON HCL 4 MG PO TABS: 4.0000 mg | ORAL_TABLET | Freq: Three times a day (TID) | ORAL | 3 refills | Status: DC | PRN

## 2019-11-25 NOTE — Procedures (Signed)
     BOTOX PROCEDURE NOTE FOR MIGRAINE HEADACHE   HISTORY: Cindy Stevenson is a 44 year old female with history of chronic migraine headache.  She presents today for her first Botox injection.  Continues to complain of daily migraine headache.  She has tried Prozac, amitriptyline, Paxil, Topamax, gabapentin, Imitrex, Maxalt, Ajovy, Emgality, metoprolol, and naratriptan without benefit. She uses all of her monthly supply of naratriptan every month.  The headaches make her nauseated, interfere with her quality of life.     Description of procedure:  The patient was placed in a sitting position. The standard protocol was used for Botox as follows, with 5 units of Botox injected at each site:   -Procerus muscle, midline injection  -Corrugator muscle, bilateral injection  -Frontalis muscle, bilateral injection, with 2 sites each side, medial injection was performed in the upper one third of the frontalis muscle, in the region vertical from the medial inferior edge of the superior orbital rim. The lateral injection was again in the upper one third of the forehead vertically above the lateral limbus of the cornea, 1.5 cm lateral to the medial injection site.  -Temporalis muscle injection, 4 sites, bilaterally. The first injection was 3 cm above the tragus of the ear, second injection site was 1.5 cm to 3 cm up from the first injection site in line with the tragus of the ear. The third injection site was 1.5-3 cm forward between the first 2 injection sites. The fourth injection site was 1.5 cm posterior to the second injection site.  -Occipitalis muscle injection, 3 sites, bilaterally. The first injection was done one half way between the occipital protuberance and the tip of the mastoid process behind the ear. The second injection site was done lateral and superior to the first, 1 fingerbreadth from the first injection. The third injection site was 1 fingerbreadth superiorly and medially from the first  injection site.  -Cervical paraspinal muscle injection, 2 sites, bilateral, the first injection site was 1 cm from the midline of the cervical spine, 3 cm inferior to the lower border of the occipital protuberance. The second injection site was 1.5 cm superiorly and laterally to the first injection site.  -Trapezius muscle injection was performed at 3 sites, bilaterally. The first injection site was in the upper trapezius muscle halfway between the inflection point of the neck, and the acromion. The second injection site was one half way between the acromion and the first injection site. The third injection was done between the first injection site and the inflection point of the neck.   A 200 unit bottle of Botox was used, 155 units were injected, the rest of the Botox was wasted. The patient tolerated the procedure well, there were no complications of the above procedure.  Botox NDC [phone] Lot number [id number] Expiration date 04/2022  I provided a refill on her Zofran and naratriptan. She was given SAMPLE botox.

## 2019-11-25 NOTE — Progress Notes (Signed)
Botox- 200 units x 4 vials Lot: [id number] Expiration: 04/2022 NDC: [id number]  Bacteriostatic 0.9% Sodium Chloride- 80mL total Lot: [id number] Expiration: 01/16/2020 NDC: [id number]  Dx: [id number] SAMPLE

## 2019-11-26 ENCOUNTER — Telehealth: Payer: Self-pay

## 2019-11-26 NOTE — Telephone Encounter (Signed)
Received a fax from [facility] asking for the diagnosis code for her Botox. Form has been filled out, signed by NP and faxed back to [facility]. Confirmation fax has been received.

## 2020-01-28 ENCOUNTER — Other Ambulatory Visit: Payer: Self-pay | Admitting: *Deleted

## 2020-01-28 MED ORDER — NARATRIPTAN HCL 2.5 MG PO TABS: ORAL_TABLET | ORAL | 1 refills | Status: DC

## 2020-04-01 ENCOUNTER — Ambulatory Visit: Payer: Medicaid Other | Admitting: Neurology

## 2020-04-08 ENCOUNTER — Other Ambulatory Visit: Payer: Self-pay

## 2020-04-08 MED ORDER — ONDANSETRON HCL 4 MG PO TABS: 4.0000 mg | ORAL_TABLET | Freq: Three times a day (TID) | ORAL | 0 refills | Status: DC | PRN

## 2020-04-30 ENCOUNTER — Ambulatory Visit (INDEPENDENT_AMBULATORY_CARE_PROVIDER_SITE_OTHER): Payer: Medicaid Other | Admitting: Adult Health

## 2020-04-30 ENCOUNTER — Encounter: Payer: Self-pay | Admitting: Adult Health

## 2020-04-30 VITALS — BP 129/92 | HR 101 | Ht 62.0 in | Wt 242.0 lb

## 2020-04-30 DIAGNOSIS — R232 Flushing: Secondary | ICD-10-CM

## 2020-04-30 DIAGNOSIS — R4589 Other symptoms and signs involving emotional state: Secondary | ICD-10-CM | POA: Diagnosis not present

## 2020-04-30 MED ORDER — ESTRADIOL 1 MG PO TABS: 1.0000 mg | ORAL_TABLET | Freq: Every day | ORAL | 6 refills | Status: DC

## 2020-04-30 NOTE — Patient Instructions (Signed)
Menopause Menopause is the normal time of life when menstrual periods stop completely. It is usually confirmed by 12 months without a menstrual period. The transition to menopause (perimenopause) most often happens between the ages of 45 and 55. During perimenopause, hormone levels change in your body, which can cause symptoms and affect your health. Menopause may increase your risk for:  Loss of bone (osteoporosis), which causes bone breaks (fractures).  Depression.  Hardening and narrowing of the arteries (atherosclerosis), which can cause heart attacks and strokes. What are the causes? This condition is usually caused by a natural change in hormone levels that happens as you get older. The condition may also be caused by surgery to remove both ovaries (bilateral oophorectomy). What increases the risk? This condition is more likely to start at an earlier age if you have certain medical conditions or treatments, including:  A tumor of the pituitary gland in the brain.  A disease that affects the ovaries and hormone production.  Radiation treatment for cancer.  Certain cancer treatments, such as chemotherapy or hormone (anti-estrogen) therapy.  Heavy smoking and excessive alcohol use.  Family history of early menopause. This condition is also more likely to develop earlier in women who are very thin. What are the signs or symptoms? Symptoms of this condition include:  Hot flashes.  Irregular menstrual periods.  Night sweats.  Changes in feelings about sex. This could be a decrease in sex drive or an increased comfort around your sexuality.  Vaginal dryness and thinning of the vaginal walls. This may cause painful intercourse.  Dryness of the skin and development of wrinkles.  Headaches.  Problems sleeping (insomnia).  Mood swings or irritability.  Memory problems.  Weight gain.  Hair growth on the face and chest.  Bladder infections or problems with urinating. How  is this diagnosed? This condition is diagnosed based on your medical history, a physical exam, your age, your menstrual history, and your symptoms. Hormone tests may also be done. How is this treated? In some cases, no treatment is needed. You and your health care provider should make a decision together about whether treatment is necessary. Treatment will be based on your individual condition and preferences. Treatment for this condition focuses on managing symptoms. Treatment may include:  Menopausal hormone therapy (MHT).  Medicines to treat specific symptoms or complications.  Acupuncture.  Vitamin or herbal supplements. Before starting treatment, make sure to let your health care provider know if you have a personal or family history of:  Heart disease.  Breast cancer.  Blood clots.  Diabetes.  Osteoporosis. Follow these instructions at home: Lifestyle  Do not use any products that contain nicotine or tobacco, such as cigarettes and e-cigarettes. If you need help quitting, ask your health care provider.  Get at least 30 minutes of physical activity on 5 or more days each week.  Avoid alcoholic and caffeinated beverages, as well as spicy foods. This may help prevent hot flashes.  Get 7-8 hours of sleep each night.  If you have hot flashes, try: ? Dressing in layers. ? Avoiding things that may trigger hot flashes, such as spicy food, warm places, or stress. ? Taking slow, deep breaths when a hot flash starts. ? Keeping a fan in your home and office.  Find ways to manage stress, such as deep breathing, meditation, or journaling.  Consider going to group therapy with other women who are having menopause symptoms. Ask your health care provider about recommended group therapy meetings. Eating and   drinking  Eat a healthy, balanced diet that contains whole grains, lean protein, low-fat dairy, and plenty of fruits and vegetables.  Your health care provider may recommend  adding more soy to your diet. Foods that contain soy include tofu, tempeh, and soy milk.  Eat plenty of foods that contain calcium and vitamin D for bone health. Items that are rich in calcium include low-fat milk, yogurt, beans, almonds, sardines, broccoli, and kale. Medicines  Take over-the-counter and prescription medicines only as told by your health care provider.  Talk with your health care provider before starting any herbal supplements. If prescribed, take vitamins and supplements as told by your health care provider. These may include: ? Calcium. Women age 51 and older should get 1,200 mg (milligrams) of calcium every day. ? Vitamin D. Women need 600-800 International Units of vitamin D each day. ? Vitamins B12 and B6. Aim for 50 micrograms of B12 and 1.5 mg of B6 each day. General instructions  Keep track of your menstrual periods, including: ? When they occur. ? How heavy they are and how long they last. ? How much time passes between periods.  Keep track of your symptoms, noting when they start, how often you have them, and how long they last.  Use vaginal lubricants or moisturizers to help with vaginal dryness and improve comfort during sex.  Keep all follow-up visits as told by your health care provider. This is important. This includes any group therapy or counseling. Contact a health care provider if:  You are still having menstrual periods after age 55.  You have pain during sex.  You have not had a period for 12 months and you develop vaginal bleeding. Get help right away if:  You have: ? Severe depression. ? Excessive vaginal bleeding. ? Pain when you urinate. ? A fast or irregular heart beat (palpitations). ? Severe headaches. ? Abdomen (abdominal) pain or severe indigestion.  You fell and you think you have a broken bone.  You develop leg or chest pain.  You develop vision problems.  You feel a lump in your breast. Summary  Menopause is the normal  time of life when menstrual periods stop completely. It is usually confirmed by 12 months without a menstrual period.  The transition to menopause (perimenopause) most often happens between the ages of 45 and 55.  Symptoms can be managed through medicines, lifestyle changes, and complementary therapies such as acupuncture.  Eat a balanced diet that is rich in nutrients to promote bone health and heart health and to manage symptoms during menopause. This information is not intended to replace advice given to you by your health care provider. Make sure you discuss any questions you have with your health care provider. Document Revised: 09/15/2017 Document Reviewed: 11/05/2016 Elsevier Patient Education  2020 [employer]  

## 2020-04-30 NOTE — Progress Notes (Signed)
  Subjective:     Patient ID: Cindy Stevenson, female   DOB: [birth year], 44 y.o.   MRN: [id number]  HPI Cindy Stevenson is a 44 year old white female single, sp [facility], in complaining of being moody and hot flashes. She has tried different SSRI without relief. PCP is [facility].  Review of Systems  +hot flashes  +moody +berast tenderness when should be on period Has clear mucous in vagina at times Had sex recently, after awhile without  Reviewed past medical,surgical, social and family history. Reviewed medications and allergies.     Objective:   Physical Exam BP (!) 129/92 (BP Location: Right Arm, Patient Position: Sitting, Cuff Size: Normal)   Pulse (!) 101   Ht 5\' 2"  (1.575 m)   Wt 242 lb (109.8 kg)   LMP 04/21/2017 Comment: [place]  BMI 44.26 kg/m  Skin warm and dry. Neck: mid line trachea, normal thyroid, good ROM, no lymphadenopathy noted. Lungs: clear to ausculation bilaterally. Cardiovascular: regular rate and rhythm.     Assessment:     1. Hot flashes Discussed estrogen and she denies any MI,stroke, DVT or breast cancer  Will try estrace Meds ordered this encounter  Medications  . estradiol (ESTRACE) 1 MG tablet    Sig: Take 1 tablet (1 mg total) by mouth daily.    Dispense:  30 tablet    Refill:  6    Order Specific Question:   Supervising Provider    Answer:   Tania Ade H [[id number]]   Review handout on menopause  2. Moody     Plan:     Return in 1 week for pap and physical with STD testing and schedule mammogram for her

## 2020-05-07 ENCOUNTER — Other Ambulatory Visit: Payer: Self-pay

## 2020-05-07 ENCOUNTER — Other Ambulatory Visit: Payer: Medicaid Other | Admitting: Adult Health

## 2020-05-07 ENCOUNTER — Telehealth: Payer: Self-pay | Admitting: Adult Health

## 2020-05-07 NOTE — Telephone Encounter (Signed)
Called pt has some pelvic cramping will come in at [address] tomorrow and do nuswab self swab and get a urine, and keep appt for next week, she was upset when was late and had to reschedule but is better now and apologized,

## 2020-05-07 NOTE — Telephone Encounter (Signed)
Pt had appt today and we had to r/s til next week she is very concerned about the pain she is having can you please call her and discuss she does not want to wait til next week to discuss .

## 2020-05-08 ENCOUNTER — Other Ambulatory Visit (INDEPENDENT_AMBULATORY_CARE_PROVIDER_SITE_OTHER): Payer: Medicaid Other | Admitting: *Deleted

## 2020-05-08 ENCOUNTER — Other Ambulatory Visit (HOSPITAL_COMMUNITY)
Admission: RE | Admit: 2020-05-08 | Discharge: 2020-05-08 | Disposition: A | Discharge status: Home / Self Care | Payer: Medicaid Other | Source: Ambulatory Visit | Attending: Obstetrics & Gynecology | Admitting: Obstetrics & Gynecology

## 2020-05-08 DIAGNOSIS — Z113 Encounter for screening for infections with a predominantly sexual mode of transmission: Secondary | ICD-10-CM | POA: Insufficient documentation

## 2020-05-08 DIAGNOSIS — R102 Pelvic and perineal pain: Secondary | ICD-10-CM | POA: Insufficient documentation

## 2020-05-08 LAB — POCT URINALYSIS DIPSTICK
Blood, UA: NEGATIVE
Glucose, UA: NEGATIVE
Ketones, UA: NEGATIVE
Leukocytes, UA: NEGATIVE
Nitrite, UA: NEGATIVE
Protein, UA: NEGATIVE

## 2020-05-08 NOTE — Progress Notes (Signed)
   NURSE VISIT- UTI SYMPTOMS   SUBJECTIVE:  Cindy Stevenson is a 44 y.o. [id number] female here for UTI symptoms. She is a GYN patient. She reports lower abdominal pain.  OBJECTIVE:  LMP 04/21/2017 Comment: [facility]  Appears well, in no apparent distress  No results found for this or any previous visit (from the past 24 hour(s)).  ASSESSMENT: GYN patient with UTI symptoms and negative nitrites  PLAN: Note routed to Cindy Stevenson, AGNP   Rx sent by provider today: No Urine culture sent Call or return to clinic prn if these symptoms worsen or fail to improve as anticipated. Follow-up: July 29   Cindy Stevenson  05/08/2020 12:04 PM

## 2020-05-08 NOTE — Progress Notes (Signed)
Chart reviewed for nurse visit. Agree with plan of care.  Estill Dooms, NP 05/08/2020 12:34 PM

## 2020-05-09 LAB — URINALYSIS, ROUTINE W REFLEX MICROSCOPIC
Bilirubin, UA: NEGATIVE
Ketones, UA: NEGATIVE
Leukocytes,UA: NEGATIVE
Nitrite, UA: NEGATIVE
Protein,UA: NEGATIVE
RBC, UA: NEGATIVE
Specific Gravity, UA: 1.021 (ref 1.005–1.030)
Urobilinogen, Ur: 0.2 mg/dL (ref 0.2–1.0)
pH, UA: 6 (ref 5.0–7.5)

## 2020-05-10 LAB — URINE CULTURE

## 2020-05-12 LAB — CERVICOVAGINAL ANCILLARY ONLY
Chlamydia: NEGATIVE
Comment: NEGATIVE
Comment: NORMAL
Neisseria Gonorrhea: NEGATIVE

## 2020-05-14 ENCOUNTER — Other Ambulatory Visit: Payer: Self-pay | Admitting: Adult Health

## 2020-05-14 ENCOUNTER — Ambulatory Visit (INDEPENDENT_AMBULATORY_CARE_PROVIDER_SITE_OTHER): Payer: Medicaid Other | Admitting: Adult Health

## 2020-05-14 ENCOUNTER — Encounter: Payer: Self-pay | Admitting: Adult Health

## 2020-05-14 ENCOUNTER — Other Ambulatory Visit (HOSPITAL_COMMUNITY)
Admission: RE | Admit: 2020-05-14 | Discharge: 2020-05-14 | Disposition: A | Discharge status: Home / Self Care | Payer: Medicaid Other | Source: Ambulatory Visit | Attending: Adult Health | Admitting: Adult Health

## 2020-05-14 VITALS — BP 130/80 | HR 111 | Ht 62.0 in | Wt 241.5 lb

## 2020-05-14 DIAGNOSIS — R4589 Other symptoms and signs involving emotional state: Secondary | ICD-10-CM

## 2020-05-14 DIAGNOSIS — Z1211 Encounter for screening for malignant neoplasm of colon: Secondary | ICD-10-CM | POA: Diagnosis not present

## 2020-05-14 DIAGNOSIS — Z79899 Other long term (current) drug therapy: Secondary | ICD-10-CM

## 2020-05-14 DIAGNOSIS — R232 Flushing: Secondary | ICD-10-CM | POA: Diagnosis not present

## 2020-05-14 DIAGNOSIS — Z0000 Encounter for general adult medical examination without abnormal findings: Secondary | ICD-10-CM | POA: Diagnosis not present

## 2020-05-14 DIAGNOSIS — Z01419 Encounter for gynecological examination (general) (routine) without abnormal findings: Secondary | ICD-10-CM | POA: Diagnosis not present

## 2020-05-14 LAB — HEMOCCULT GUIAC POC 1CARD (OFFICE): Fecal Occult Blood, POC: NEGATIVE

## 2020-05-14 MED ORDER — BUSPIRONE HCL 5 MG PO TABS: 5.0000 mg | ORAL_TABLET | Freq: Three times a day (TID) | ORAL | 6 refills | Status: DC

## 2020-05-14 NOTE — Progress Notes (Signed)
Patient ID: Cindy Stevenson, female   DOB: 06/25/1976, 44 y.o.   MRN: [id number] History of Present Illness: [name] is a 44 year old white female, single, sp Supracervical hysterectomy in for well woman gyn exam and pap. PCP is [employer] PA.   Current Medications, Allergies, Past Medical History, Past Surgical History, Family History and Social History were reviewed in [employer] record.     Review of Systems: Patient denies any headaches, hearing loss, fatigue, blurred vision, shortness of breath, chest pain, abdominal pain, problems with bowel movements, urination, or intercourse. No joint pain, still moody at times , gets anger Hot flashes are better since starting estrace   Physical Exam:BP (!) 130/80 (BP Location: Left Arm, Patient Position: Sitting, Cuff Size: Large)   Pulse (!) 111   Ht 5\' 2"  (1.575 m)   Wt (!) 241 lb 8 oz (109.5 kg)   LMP 04/21/2017 Comment: [place]  BMI 44.17 kg/m  General:  Well developed, well nourished, no acute distress Skin:  Warm and dry Neck:  Midline trachea, normal thyroid, good ROM, small tender lymph node behind right ear Lungs; Clear to auscultation bilaterally Breast:  No dominant palpable mass, retraction, or nipple discharge,has cherry hemangioma right breast  Cardiovascular: Regular rate and rhythm Abdomen:  Soft, non tender, no hepatosplenomegaly Pelvic:  External genitalia is normal in appearance, no lesions.  The vagina is normal in appearance. Urethra has no lesions or masses. The cervix is smooth, pap with High risk HPV 16/18 genotyping performed.   Uterus is absent.   No adnexal masses or tenderness noted.Bladder is non tender, no masses felt. Rectal: Good sphincter tone, no polyps, + hemorrhoids felt.  Hemoccult negative. Extremities/musculoskeletal:  No swelling or varicosities noted, no clubbing or cyanosis Psych:  No mood changes, alert and cooperative,seems happy AA is 0 Fall risk is high, assisted off  table PHQ 9 score is 21, no SI on Cymbalta,will add Buspar  Examination chaperoned by Levy Pupa LPN   Impression and Plan:  1. Routine medical exam Pap sent   2. Encounter for gynecological examination with Papanicolaou smear of cervix Pap sent Physical in 1 year Pap in 3 if normal Get mammogram Labs with PCP Colonoscopy at 47   3. Encounter for screening fecal occult blood testing  4. Hot flashes Continue estrace has refills  5. Moody Will add buspar  Meds ordered this encounter  Medications  . busPIRone (BUSPAR) 5 MG tablet    Sig: Take 1 tablet (5 mg total) by mouth 3 (three) times daily.    Dispense:  90 tablet    Refill:  6    Order Specific Question:   Supervising Provider    Answer:   Elonda Husky, [name] [[id number]]    6. Current use of estrogen therapy Has refills on estrace 1 mg

## 2020-05-15 LAB — CYTOLOGY - PAP
Adequacy: ABSENT
Comment: NEGATIVE
Diagnosis: NEGATIVE
High risk HPV: NEGATIVE

## 2020-05-20 ENCOUNTER — Other Ambulatory Visit: Payer: Self-pay | Admitting: *Deleted

## 2020-05-20 MED ORDER — ONDANSETRON HCL 4 MG PO TABS: ORAL_TABLET | ORAL | 0 refills | Status: DC

## 2020-06-03 ENCOUNTER — Other Ambulatory Visit: Payer: Self-pay | Admitting: *Deleted

## 2020-06-04 ENCOUNTER — Telehealth: Payer: Self-pay | Admitting: *Deleted

## 2020-06-04 MED ORDER — NARATRIPTAN HCL 2.5 MG PO TABS: 2.5000 mg | ORAL_TABLET | ORAL | 0 refills | Status: DC | PRN

## 2020-06-04 NOTE — Telephone Encounter (Signed)
Pt returned call wanting to discuss her medication with RN to make sure there was a correction on which medication was sent in for the refill.

## 2020-06-04 NOTE — Telephone Encounter (Signed)
Spoke to pt and she did get maxalt from her pcp as a refill from her (when she meant to get naratriptan).  I relayed that I can refill, but not to use both on same day.   She knew this and verbalized understanding.  I refilled.  I was to make appt for her with SS/NP she thought is was for BOTOX.  I did not see it was for that in 03-2020.  I will forward to TC to see if approved and then can be called for appt.

## 2020-06-04 NOTE — Telephone Encounter (Signed)
I called pt Cindy Stevenson for her that received refill request for naratriptan/ Amerge) as I see got maxalt from other provider.  Please call back,  Needs appt as well scheduled.

## 2020-06-08 NOTE — Telephone Encounter (Signed)
I called the patient regarding Botox. It does not look like she has any insurance. LVM asking her to call me back to discuss.

## 2020-08-13 ENCOUNTER — Telehealth: Payer: Self-pay | Admitting: Adult Health

## 2020-08-13 NOTE — Telephone Encounter (Signed)
Pt advised to keep tomorrow's appt. Pt was started on Buspar but has stopped med due to having headaches every time she took Buspar. Cindy Stevenson

## 2020-08-13 NOTE — Telephone Encounter (Signed)
Pt calling to ask if she should keep her appointment 08/13/2020 with Anderson Malta or reschedule States Anderson Malta wanted her to get a mammogram before this visit & pt states she's been unable to get it done  Please advise & call pt

## 2020-08-14 ENCOUNTER — Ambulatory Visit: Payer: Medicaid Other | Admitting: Adult Health

## 2020-08-18 ENCOUNTER — Telehealth: Payer: Self-pay | Admitting: Neurology

## 2020-08-18 NOTE — Telephone Encounter (Signed)
Pt states her PCP advised her to see what her Neurologist would say about having her to try Nurtec, please call

## 2020-08-18 NOTE — Telephone Encounter (Signed)
Needs appt.  Last seen 11/2019.

## 2020-08-19 NOTE — Telephone Encounter (Addendum)
Reviewed the chart, last seen for office visit 10/01/2019, has been on a multitude of medication including Prozac, amitriptyline, Paxil, Topamax, gabapentin, Imitrex, Maxalt, Ajovy, Emgality, metoprolol, and naratriptan, even Botox in February 2021.  Has not been a candidate for beta-blocker given history of depression, not a candidate for Depakote given history of obesity.  If headaches continue to be poor control, agree she needs office visit, we are limited in our options that have not been tried at this point.  If headaches are daily, retry of Botox is likely the best option.  PCP can offer Nurtec for abortive therapy. I am open to retry of medications such as CGRP if 1 was particularly helpful

## 2020-08-19 NOTE — Telephone Encounter (Signed)
Pt called back.  She has had daily headaches since October.  Has used naratriptan not working.  Wakes up with headaches pain in R and L eyes, and neck area. Took abx for sinus no help.  Botox did not help did not call back to reschedule (realizes that needs 3 times before benefit).  Saw pcp Monday offered toradol, but she drives could not get,  She states pcp will prescribe nurtec but wanted our approval.  Pt has nausea light and sensitivity.  Level today about 8.  I do not have appt available.  Placed on cancellation list.  Ok to fax to pcp that ok forher to prescribe nurtec.  Please advise.

## 2020-08-19 NOTE — Telephone Encounter (Signed)
Pt returned call. Pt would like a call back to discuss the medication. Please advise.

## 2020-08-19 NOTE — Telephone Encounter (Signed)
I called pt and LMVM for her that nurtec is medication that we do prescribe for pts.  Last seen for botox 11-2019, she needs appt.  I will place on cancellation list at my desk, as I know SS/NP do not have any availability soon.  She had botox 2/21 help??

## 2020-08-19 NOTE — Telephone Encounter (Signed)
I called and was not able to reach pt.  Outside of coverage area.

## 2020-08-20 NOTE — Telephone Encounter (Signed)
I called [facility] and checked fax #.  Confirmed and did refax to them.  Spoke to pt and let her know as well.

## 2020-08-20 NOTE — Telephone Encounter (Addendum)
I called pt and she will come in Monday 08-24-20 1315. for OV. Faxed to her pcp per pt request ok to prescribe nurtec.  [phone]

## 2020-08-20 NOTE — Telephone Encounter (Signed)
refaxed to pts pcp that ok to prescribed nurtec.

## 2020-08-20 NOTE — Telephone Encounter (Signed)
Pt. States she called PCP & they have not received fax for Nurtec & asks for it to be resent.  Pt.'s contact number: [phone]

## 2020-08-24 ENCOUNTER — Encounter: Payer: Self-pay | Admitting: Neurology

## 2020-08-24 ENCOUNTER — Telehealth: Payer: Self-pay | Admitting: Neurology

## 2020-08-24 ENCOUNTER — Ambulatory Visit: Payer: Medicaid Other | Admitting: Neurology

## 2020-08-24 VITALS — BP 132/88 | Ht 62.0 in | Wt 230.6 lb

## 2020-08-24 DIAGNOSIS — G43019 Migraine without aura, intractable, without status migrainosus: Secondary | ICD-10-CM

## 2020-08-24 DIAGNOSIS — G8929 Other chronic pain: Secondary | ICD-10-CM | POA: Diagnosis not present

## 2020-08-24 DIAGNOSIS — R519 Headache, unspecified: Secondary | ICD-10-CM

## 2020-08-24 DIAGNOSIS — M5441 Lumbago with sciatica, right side: Secondary | ICD-10-CM

## 2020-08-24 DIAGNOSIS — M545 Low back pain, unspecified: Secondary | ICD-10-CM | POA: Insufficient documentation

## 2020-08-24 MED ORDER — NURTEC 75 MG PO TBDP: 75.0000 mg | ORAL_TABLET | ORAL | 11 refills | Status: DC | PRN

## 2020-08-24 NOTE — Progress Notes (Signed)
I have read the note, and I agree with the clinical assessment and plan.  Anastashia Westerfeld K Kerrion Kemppainen   

## 2020-08-24 NOTE — Patient Instructions (Signed)
Will get you back in for Botox  Will send in Nurtec for acute headache  Check MRI of the brain, Lumbar spine  See you back in 6 months
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
## 2020-08-24 NOTE — Telephone Encounter (Signed)
Cindy Stevenson, can we see about getting her set up for Botox injection, previously had in Feb.   [name], can we check on PA for Nurtec?

## 2020-08-24 NOTE — Telephone Encounter (Signed)
See 08/24/20 encounter for Nurtec PA.

## 2020-08-24 NOTE — Telephone Encounter (Signed)
Received question set from [email or web address]. Information has been sent back to insurance. Will await determination.

## 2020-08-24 NOTE — Telephone Encounter (Signed)
Started PA for Nurtec on [email or web address]. Key is [id number]. Information has been submitted, waiting on the question response. Will check back later.

## 2020-08-25 ENCOUNTER — Telehealth: Payer: Self-pay | Admitting: Neurology

## 2020-08-25 DIAGNOSIS — G8929 Other chronic pain: Secondary | ICD-10-CM

## 2020-08-25 DIAGNOSIS — M5441 Lumbago with sciatica, right side: Secondary | ICD-10-CM

## 2020-08-25 NOTE — Telephone Encounter (Signed)
Received fax from [facility] stating Nurtec has been approved. There is no end date for the approval. Will fax a copy of the determination to patient's pharmacy.

## 2020-08-25 NOTE — Telephone Encounter (Signed)
mcd [facility] pending.

## 2020-08-31 ENCOUNTER — Other Ambulatory Visit: Payer: Self-pay | Admitting: Neurology

## 2020-08-31 MED ORDER — NARATRIPTAN HCL 2.5 MG PO TABS: 2.5000 mg | ORAL_TABLET | ORAL | 0 refills | Status: DC | PRN

## 2020-08-31 NOTE — Telephone Encounter (Signed)
Has tried multiple preventative medications, including Ajovy, Emgality. Our plan was to get Botox set back up, last was in Feb 2021. If she wants to retry another abortive medication we can.

## 2020-08-31 NOTE — Progress Notes (Signed)
I called the patient, Nurtec is not helpful, wants to go back to Amerge, rx was sent.

## 2020-08-31 NOTE — Telephone Encounter (Signed)
I called the patient about the MRI of Lumbar Spine, has never done PT, or chiropractor, nothing within the last 6 months. I will order PT, if this isn't helpful, we can try to order MRI Lumbar Spine again.

## 2020-08-31 NOTE — Telephone Encounter (Signed)
Medicaid [employer] approved the MRI Brain but not the MRI Lumbar spine..  "reason is six weeks of back stretches (physical therapy, chiropractic treatments, or medically directed home exercise program) in the last six months"  If you would like to do a peer to peer the phone number is [phone] and the tracking number is [id number]. They informed me you have 10 business days from 08/28/20 to complete.

## 2020-08-31 NOTE — Telephone Encounter (Signed)
I called pt and she states that Nurtec tried x 2 and did not work.  (no change).  MRI auth still in pending.  botox ? Will have to check.

## 2020-08-31 NOTE — Telephone Encounter (Addendum)
PA for Botox in progress.

## 2020-08-31 NOTE — Telephone Encounter (Signed)
ithink she is willing to try another abortive, (even mentioned going back to amerge).

## 2020-08-31 NOTE — Telephone Encounter (Signed)
Pt. states Nurtec is not working for her. She is having headaches on left side. Please advise.  Best contact: [phone]

## 2020-08-31 NOTE — Telephone Encounter (Signed)
 Medicaid/[employer] Botox PA form has been filled out. I will give to NP to sign.

## 2020-09-01 ENCOUNTER — Telehealth: Payer: Self-pay | Admitting: Neurology

## 2020-09-01 NOTE — Telephone Encounter (Signed)
scheduled 11/24 630PM

## 2020-09-01 NOTE — Telephone Encounter (Signed)
Noted, do you still want her to proceed on having the MRI brain since it was approved??

## 2020-09-01 NOTE — Telephone Encounter (Signed)
Pt called, prescription for naratriptan (AMERGE) 2.5 MG tablet did not go through at the pharmacy. Want to know if anything can be done. Would like a call from the nurse.

## 2020-09-01 NOTE — Telephone Encounter (Signed)
Received a PA request for naratriptan 2.5mg  from patient's pharmacy. PA was started on [email or web address]. Key is [id number]. Per [email or web address], a determination will be made within the next 72 hours. Will check back later for an update.

## 2020-09-01 NOTE — Telephone Encounter (Signed)
Received fax from [employer]. PA for naratriptan has been approved. The fax did not state an end date for approval. Will fax a copy of the determination to patient's pharmacy.

## 2020-09-01 NOTE — Telephone Encounter (Addendum)
Called pharmacy and had to have another PA, lat one expire 05-2020.  Initiated PA for naratriptan 2.5mg  KEY [id number]. Received message that pending PA already in place.  Per other message Consuello Bossier, CMA got approval.

## 2020-09-01 NOTE — Telephone Encounter (Signed)
order faxed to [facility] they will reach out to the patient to schedule  mcd wellcare auth: [id number] (exp. 08/25/20 to 10/24/20)

## 2020-09-07 ENCOUNTER — Ambulatory Visit: Payer: Medicaid Other | Admitting: Adult Health

## 2020-09-07 NOTE — Telephone Encounter (Addendum)
Received approval from [facility] via fax. PA #[id number] (09/07/20- 03/07/21). I called [employer] and spoke with Bobbi to check PBM/Specialty Pharmacy for patient. Tammi Klippel states that they do not have a preferred specialty pharmacy. Reference #[id number].  I called the patient and LVM about scheduling a Botox appointment.

## 2020-09-07 NOTE — Telephone Encounter (Signed)
Faxed signed PA request to [facility].

## 2020-09-08 ENCOUNTER — Ambulatory Visit (INDEPENDENT_AMBULATORY_CARE_PROVIDER_SITE_OTHER): Payer: Medicaid Other | Admitting: Adult Health

## 2020-09-08 ENCOUNTER — Encounter: Payer: Self-pay | Admitting: Adult Health

## 2020-09-08 ENCOUNTER — Other Ambulatory Visit: Payer: Self-pay

## 2020-09-08 VITALS — BP 150/96 | HR 101 | Ht 62.0 in | Wt 235.0 lb

## 2020-09-08 DIAGNOSIS — F32A Depression, unspecified: Secondary | ICD-10-CM

## 2020-09-08 DIAGNOSIS — R4589 Other symptoms and signs involving emotional state: Secondary | ICD-10-CM

## 2020-09-08 MED ORDER — VENLAFAXINE HCL ER 75 MG PO CP24: 75.0000 mg | ORAL_CAPSULE | Freq: Every day | ORAL | 3 refills | Status: DC

## 2020-09-08 NOTE — Progress Notes (Signed)
  Subjective:     Patient ID: Cindy Stevenson, female   DOB: Apr 27, 1976, 44 y.o.   MRN: [id number]  HPI Cindy Stevenson is a 44 year old white female, single, sp [facility], back in follow up on taking Buspar but she did not take long due to headaches. She also stopped Cymbalta,thinking that it may contribute to the headaches. PCP is Tawni Carnes, [place].   Review of Systems +depression +moody Has headaches seeing Dr Jannifer Franklin to get MRI and then botox Has discomfort after sex  Reviewed past medical,surgical, social and family history. Reviewed medications and allergies.     Objective:   Physical Exam BP (!) 150/96 (BP Location: Left Arm, Patient Position: Sitting, Cuff Size: Normal)   Pulse (!) 101   Ht 5\' 2"  (1.575 m)   Wt 235 lb (106.6 kg)   LMP 04/21/2017 Comment: [place]  BMI 42.98 kg/m  Skin warm and dry. Lungs: clear to ausculation bilaterally. Cardiovascular: regular rate and rhythm.   PHQ 9 score is 18, no SI, she sees [place] Toomes for counseling, will try effexor   Upstream - 09/08/20 1635      Pregnancy Intention Screening   Does the patient want to become pregnant in the next year? No    Does the patient's partner want to become pregnant in the next year? No    Would the patient like to discuss contraceptive options today? No      Contraception Wrap Up   Current Method No Method - Other Reason   hyst   End Method No Method - Other Reason   hyst   Contraception Counseling Provided No          Assessment:     1. Depression, unspecified depression type Will try Effexor  Meds ordered this encounter  Medications  . venlafaxine XR (EFFEXOR-XR) 75 MG 24 hr capsule    Sig: Take 1 capsule (75 mg total) by mouth daily.    Dispense:  30 capsule    Refill:  3    Order Specific Question:   Supervising Provider    Answer:   Elonda Husky, [name] [[id number]]   2. Moody     Plan:     Follow up with me in 3 months

## 2020-09-09 ENCOUNTER — Telehealth: Payer: Self-pay | Admitting: *Deleted

## 2020-09-09 NOTE — Telephone Encounter (Signed)
Received call from pt who was speaking with Lattie Haw in phone room, about an appt she has for botox.  there was no official appt scheduled only on hold slot that was found at 1515 today for pt .  Pt was calling at 1459 today.  [place], she was trying to get here to do botox and then MRI later today.  She stated she had tried to call yesterday and today about appt, but couldreach no one.  She could not get the # she was trying to call.  I dont know if taylor has a separate # that she gives pts.  I relayed that I did not see held slot until just before I called her  (I though was earlier this week).  I mentioned that unless she can get here at 1515 that she would need to be rescheduled. She thought I was being funny (not in kind way), so I apologized to her if I said something that offended her. She wanted to speak to you or Dr. Jannifer Franklin.

## 2020-09-09 NOTE — Telephone Encounter (Signed)
I called the patient, let's get her schedule for Botox at next opening, she is okay with this. Is coming this afternoon for her MRI.

## 2020-09-14 ENCOUNTER — Telehealth: Payer: Self-pay | Admitting: Neurology

## 2020-09-14 NOTE — Telephone Encounter (Signed)
Received MRI of the brain report from [place], completed on 09/09/2020.  MRI shows no acute intercranial abnormality.   IMPRESSION:  1. No acute intracranial abnormality.

## 2020-09-14 NOTE — Telephone Encounter (Signed)
I called the patient and was able to get her scheduled for tomorrow at 9:15. Advised the patient to arrive at 9:00. I apologized to the patient for the situation Wednesday and for any miscommunication. Patient agreed to arrive at 9:00. FYI

## 2020-09-15 ENCOUNTER — Ambulatory Visit: Payer: Medicaid Other | Admitting: Neurology

## 2020-09-15 DIAGNOSIS — G43019 Migraine without aura, intractable, without status migrainosus: Secondary | ICD-10-CM

## 2020-09-15 NOTE — Procedures (Signed)
° ° ° °  BOTOX PROCEDURE NOTE FOR MIGRAINE HEADACHE   HISTORY: Cindy Stevenson is a 44 year old female with history of chronic migraine headaches, who presents today for her second Botox injection.  She had her initial injection in February, then did not reschedule for 3 months later.  Complains of daily headache, has tried and failed a multitude of other medications (including Prozac, amitriptyline, Paxil, Topamax, gabapentin, Imitrex, Maxalt, Ajovy, Emgality, metoprolol, and naratriptan.  We even did Botox in February 2021.  Is not a candidate for Depakote given history of obesity, or beta-blocker due to history of depression)  Description of procedure:  The patient was placed in a sitting position. The standard protocol was used for Botox as follows, with 5 units of Botox injected at each site:   -Procerus muscle, midline injection  -Corrugator muscle, bilateral injection  -Frontalis muscle, bilateral injection, with 2 sites each side, medial injection was performed in the upper one third of the frontalis muscle, in the region vertical from the medial inferior edge of the superior orbital rim. The lateral injection was again in the upper one third of the forehead vertically above the lateral limbus of the cornea, 1.5 cm lateral to the medial injection site.  -Temporalis muscle injection, 4 sites, bilaterally. The first injection was 3 cm above the tragus of the ear, second injection site was 1.5 cm to 3 cm up from the first injection site in line with the tragus of the ear. The third injection site was 1.5-3 cm forward between the first 2 injection sites. The fourth injection site was 1.5 cm posterior to the second injection site.  -Occipitalis muscle injection, 3 sites, bilaterally. The first injection was done one half way between the occipital protuberance and the tip of the mastoid process behind the ear. The second injection site was done lateral and superior to the first, 1 fingerbreadth from  the first injection. The third injection site was 1 fingerbreadth superiorly and medially from the first injection site.  -Cervical paraspinal muscle injection, 2 sites, bilateral, the first injection site was 1 cm from the midline of the cervical spine, 3 cm inferior to the lower border of the occipital protuberance. The second injection site was 1.5 cm superiorly and laterally to the first injection site.  -Trapezius muscle injection was performed at 3 sites, bilaterally. The first injection site was in the upper trapezius muscle halfway between the inflection point of the neck, and the acromion. The second injection site was one half way between the acromion and the first injection site. The third injection was done between the first injection site and the inflection point of the neck.   A 200 unit bottle of Botox was used, 155 units were injected, the rest of the Botox was wasted. The patient tolerated the procedure well, there were no complications of the above procedure.  Botox NDC 1638-4536-46 Lot number [id number] Expiration date 11/2022 B/B

## 2020-09-15 NOTE — Progress Notes (Signed)
Botox- 100 units x 2 vials Lot: [id number] Expiration: 11/2022 NDC: 4840-3979-53  Bacteriostatic 0.9% Sodium Chloride- 42mL total Lot: [id number] Expiration: 11/2021 NDC: 9794-9971-82  Dx: U99.068 B/B

## 2020-09-23 ENCOUNTER — Telehealth: Payer: Self-pay | Admitting: Neurology

## 2020-09-23 NOTE — Telephone Encounter (Signed)
Pt called, had Botox injection on 11/30, Monday got a rash on my face, arms, legs and in my scalp. Would like a call from the nurse.

## 2020-09-23 NOTE — Telephone Encounter (Signed)
Pt request refill naratriptan (AMERGE) 2.5 MG tablet at [facility]

## 2020-09-24 MED ORDER — NARATRIPTAN HCL 2.5 MG PO TABS: 2.5000 mg | ORAL_TABLET | ORAL | 0 refills | Status: DC | PRN

## 2020-09-24 NOTE — Telephone Encounter (Signed)
I called pt and she had BOTOX 09-15-20, on that Monday had rash started face, then scalp the to arms and legs.  Not itchy.  She took benadryl went to pcp on Monday, etiology?  Last 2 days has had migraine.  Left side headache, level 10.  She has no amerge left, last fill 08-31-20 for #10.  Has taken tylenol not helped. Ondansetron rx by her pcp.  Has nausea, photo/phono sensitivity. Also noted bilateral eyelid droop (does not effect vision).  She has not had prednisone or depacon.

## 2020-09-24 NOTE — Addendum Note (Signed)
Addended by: Brandon Melnick on: 09/24/2020 01:17 PM   Modules accepted: Orders

## 2020-09-24 NOTE — Telephone Encounter (Signed)
It takes the Botox about 10 days to start working, hopefully will kick in soon. For acute headache, if Amerge will help, we can fill it early. Last done on 08/31/2020. If prolonged headache (would reserve these options only if truly needed), can offer prednisone taper 5 mg, or even Depacon injection in office (if comes in will take a look at her eyes, can be side effect of Botox, but I am very careful in this area, if so will wear off). Not clear the rash is related to the Botox, but need to consider do we repeat again? Next time take a picture. If felt to have true reaction to Botox, consider referring to headache clinic or [facility].

## 2020-09-24 NOTE — Telephone Encounter (Signed)
I called pt and relayed that will refill the amerge hopefully will be able to get, if not let us know.  Prednisone possiblity if not, and migraine seems to continue over the weekend.  I relayed that botox rx rash not a common SE, would not stop from doing another BOTOX injection.  Also consider referring to another headache clinic or academic center for other options.  Pt stated understanding.  Will let me know if amerge is not available.

## 2020-09-24 NOTE — Telephone Encounter (Signed)
See other note

## 2020-09-28 ENCOUNTER — Other Ambulatory Visit: Payer: Self-pay | Admitting: Neurology

## 2020-09-28 MED ORDER — PREDNISONE 5 MG PO TABS: ORAL_TABLET | ORAL | 0 refills | Status: DC

## 2020-09-28 NOTE — Progress Notes (Signed)
I sent a prednisone taper pack for prolonged headache.

## 2020-10-06 ENCOUNTER — Telehealth: Payer: Self-pay | Admitting: Neurology

## 2020-10-06 NOTE — Telephone Encounter (Signed)
Pt has called to report that she has a continued migraine after seening primary Dr and going to ED.  Pt has gone thru her predniSONE (DELTASONE) 5 MG tablet.  Please call.

## 2020-10-06 NOTE — Telephone Encounter (Signed)
Called pt, could not LM, travelled out coverage area.  Has called several times in last couple weeks,  Has had botox, amerge, prednisone. Continued migraine.

## 2020-10-07 ENCOUNTER — Other Ambulatory Visit: Payer: Self-pay | Admitting: *Deleted

## 2020-10-07 MED ORDER — NARATRIPTAN HCL 2.5 MG PO TABS: 2.5000 mg | ORAL_TABLET | ORAL | 0 refills | Status: DC | PRN

## 2020-10-07 MED ORDER — CHLORPROMAZINE HCL 50 MG PO TABS: 50.0000 mg | ORAL_TABLET | Freq: Two times a day (BID) | ORAL | 0 refills | Status: DC

## 2020-10-07 NOTE — Telephone Encounter (Signed)
I called the patient, she has had severe headaches over the last couple weeks.  The naratriptan will help, she just finished a prednisone Dosepak with some benefit but when she stops it the headache comes back.  The first Botox injection helped her, but not the second.  Aimovig helped initially but then stopped helping.  She recently was taken off of the Effexor, the metoprolol was increased to 50 mg, she has been on a multitude of medications in the past for her headache.  She is on very low-dose gabapentin.  The patient be given a 3-day course of Thorazine 50 mg tablets.  If this does not help, we may get her in for a Depacon injection.  The patient reports a lot of nausea and extreme light sensitivity.

## 2020-10-07 NOTE — Addendum Note (Signed)
Addended by: Kathrynn Ducking on: 10/07/2020 05:09 PM   Modules accepted: Orders

## 2020-10-07 NOTE — Telephone Encounter (Signed)
Spoke to pt after speaking to pharmacy.  She is able to get 12/ tablet naratriptan at a time.  Her last fill she got 10, so she is able to get 2 tablets and then another fill 10-17-20.   She is afraid she may still have issue with migraine. (intractable headache).  Please advise.

## 2020-10-07 NOTE — Telephone Encounter (Signed)
I called pt she has been to pcp 5 times, last time was 10-06-20 got phenergan and toradol injections.  Helps some but comes back.  Has tried ABX? (not neuro) botox, nurtec, prednisone, amerge (naratriptan)- which she states does help.  I refilled to [facility] to see if can get it but will call back if not.  ? Intractable migraine she states per pcp )to contact us).  Had also been to ED previously.  She was appreciative.

## 2020-10-13 ENCOUNTER — Telehealth: Payer: Self-pay | Admitting: *Deleted

## 2020-10-13 NOTE — Telephone Encounter (Signed)
Received fax from [facility]: Naratriptan PA. Form filled out, on desk for NP signature.

## 2020-10-13 NOTE — Telephone Encounter (Signed)
I called pt and relayed that DEPACON infusion is available tomorrow with our infusion suite at 8 or 1000.  Pt will come 1000.  Explained is sz medication but used for migraine. 500mg  IV may repeat x 1 if needed.  Thorazine only caused her to sleep.  Migraine headache still there.  SS/NP ordered and pt will come in tomorrow.

## 2020-10-13 NOTE — Telephone Encounter (Signed)
Form signed and faxed to [facility], receive confirmation.

## 2020-10-13 NOTE — Telephone Encounter (Signed)
Received fax from pharmacy dept: duplicate request. Approved on 09/01/20, will fax pharmacy to advise of approval in place.

## 2020-11-19 ENCOUNTER — Encounter: Payer: Self-pay | Admitting: Neurology

## 2020-11-19 MED ORDER — NARATRIPTAN HCL 2.5 MG PO TABS: 2.5000 mg | ORAL_TABLET | ORAL | 3 refills | Status: DC | PRN

## 2020-11-26 ENCOUNTER — Other Ambulatory Visit: Payer: Self-pay | Admitting: Adult Health

## 2020-12-09 ENCOUNTER — Encounter: Payer: Self-pay | Admitting: Adult Health

## 2020-12-09 ENCOUNTER — Other Ambulatory Visit (HOSPITAL_COMMUNITY)
Admission: RE | Admit: 2020-12-09 | Discharge: 2020-12-09 | Disposition: A | Discharge status: Home / Self Care | Payer: Medicaid Other | Source: Ambulatory Visit | Attending: Adult Health | Admitting: Adult Health

## 2020-12-09 ENCOUNTER — Other Ambulatory Visit: Payer: Self-pay

## 2020-12-09 ENCOUNTER — Ambulatory Visit (INDEPENDENT_AMBULATORY_CARE_PROVIDER_SITE_OTHER): Payer: Medicaid Other | Admitting: Adult Health

## 2020-12-09 VITALS — BP 136/82 | HR 90 | Ht 61.0 in | Wt 247.0 lb

## 2020-12-09 DIAGNOSIS — Z113 Encounter for screening for infections with a predominantly sexual mode of transmission: Secondary | ICD-10-CM | POA: Diagnosis not present

## 2020-12-09 DIAGNOSIS — Z136 Encounter for screening for cardiovascular disorders: Secondary | ICD-10-CM | POA: Insufficient documentation

## 2020-12-09 DIAGNOSIS — F32A Depression, unspecified: Secondary | ICD-10-CM | POA: Insufficient documentation

## 2020-12-09 DIAGNOSIS — R232 Flushing: Secondary | ICD-10-CM

## 2020-12-09 DIAGNOSIS — N898 Other specified noninflammatory disorders of vagina: Secondary | ICD-10-CM

## 2020-12-09 DIAGNOSIS — N3946 Mixed incontinence: Secondary | ICD-10-CM | POA: Insufficient documentation

## 2020-12-09 DIAGNOSIS — F419 Anxiety disorder, unspecified: Secondary | ICD-10-CM | POA: Insufficient documentation

## 2020-12-09 MED ORDER — ESTRADIOL 1 MG PO TABS: ORAL_TABLET | ORAL | 6 refills | Status: DC

## 2020-12-09 MED ORDER — OXYBUTYNIN CHLORIDE ER 10 MG PO TB24: 10.0000 mg | ORAL_TABLET | Freq: Every day | ORAL | 6 refills | Status: DC

## 2020-12-09 NOTE — Progress Notes (Signed)
  Subjective:     Patient ID: Cindy Stevenson, female   DOB: November 25, 1975, 45 y.o.   MRN: [id number]  HPI Cindy Stevenson is a 45 year old white female,single, sp [facility] in requesting STD testing and has vaginal irritation on the right, UI and increased hot flashes and moody, has anxiety and depression, she stopped effexor. Was seen in ER with headache and told to stop it. Her PCP has given her xanax.  PCP is Tawni Carnes PA  Review of Systems Has vaginal irritation on right side of vagina, used neosporin Has urinary incontinence, if coughs, has to go and at night if dreams +hot flashes  Has anxiety and depression and moody Reviewed past medical,surgical, social and family history. Reviewed medications and allergies.      Objective:   Physical Exam BP 136/82 (BP Location: Left Arm, Patient Position: Sitting, Cuff Size: Large)   Pulse 90   Ht 5\' 1"  (1.549 m)   Wt 247 lb (112 kg)   LMP 04/21/2017 Comment: [place]  BMI 46.67 kg/m  Skin warm and dry.Pelvic: external genitalia is normal in appearance no lesions, vagina: pink, no lesions,urethra has no lesions or masses noted, cervix:smooth, uterus: absent, adnexa: no masses or tenderness noted. Bladder is non tender and no masses felt. CV swab obtained. Examination chaperoned by Levy Pupa LPN Fall risk is high PHQ 9 score is 24, denies any SI or Hi GAD 7 score is 21   Upstream - 12/09/20 1619      Pregnancy Intention Screening   Does the patient want to become pregnant in the next year? No    Does the patient's partner want to become pregnant in the next year? No    Would the patient like to discuss contraceptive options today? No      Contraception Wrap Up   Current Method Female Sterilization   [facility]   End Method Female Sterilization   [facility]   Contraception Counseling Provided No             Assessment:     1. Screening examination for STD (sexually transmitted disease) CV swab sent for GC/CHL, trich and BV and yeast HIV, RPR, and  hepatitis C antibody  2. Vaginal irritation CV swab sent   3. Anxiety and depression Follow up with PCP  4. Hot flashes Increase estrace to 2 mg Meds ordered this encounter  Medications  . estradiol (ESTRACE) 1 MG tablet    Sig: Take 2 daily    Dispense:  60 tablet    Refill:  6    Order Specific Question:   Supervising Provider    Answer:   Elonda Husky, [name] [[id number]]  . oxybutynin (DITROPAN-XL) 10 MG 24 hr tablet    Sig: Take 1 tablet (10 mg total) by mouth at bedtime.    Dispense:  30 tablet    Refill:  6    Order Specific Question:   Supervising Provider    Answer:   Elonda Husky, [name] [[id number]]    5. Mixed stress and urge urinary incontinence Will rx oxybutynin    Plan:       Follow up in 6 months for physical and ROS

## 2020-12-10 LAB — RPR: RPR Ser Ql: NONREACTIVE

## 2020-12-10 LAB — HIV ANTIBODY (ROUTINE TESTING W REFLEX): HIV Screen 4th Generation wRfx: NONREACTIVE

## 2020-12-10 LAB — HEPATITIS C ANTIBODY: Hep C Virus Ab: <0.1 s/co ratio (ref 0.0–0.9)

## 2020-12-11 LAB — CERVICOVAGINAL ANCILLARY ONLY
Bacterial Vaginitis (gardnerella): NEGATIVE
Candida Glabrata: NEGATIVE
Candida Vaginitis: NEGATIVE
Chlamydia: NEGATIVE
Comment: NEGATIVE
Comment: NEGATIVE
Comment: NEGATIVE
Comment: NEGATIVE
Comment: NEGATIVE
Comment: NORMAL
Neisseria Gonorrhea: NEGATIVE
Trichomonas: NEGATIVE

## 2020-12-17 ENCOUNTER — Ambulatory Visit: Payer: Medicaid Other | Admitting: Neurology

## 2021-01-13 ENCOUNTER — Ambulatory Visit: Payer: Medicaid Other | Admitting: Neurology

## 2021-02-11 ENCOUNTER — Other Ambulatory Visit: Payer: Self-pay | Admitting: Adult Health

## 2021-02-21 ENCOUNTER — Other Ambulatory Visit: Payer: Self-pay | Admitting: Neurology

## 2021-02-22 ENCOUNTER — Encounter: Payer: Self-pay | Admitting: Neurology

## 2021-02-22 ENCOUNTER — Ambulatory Visit: Payer: Medicaid Other | Admitting: Neurology
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
## 2021-05-12 ENCOUNTER — Telehealth: Payer: Self-pay | Admitting: Neurology

## 2021-05-12 NOTE — Telephone Encounter (Signed)
Per Epic, the patient is now using naratriptan. The phone note from 08/24/20 states the patient tried Nurtec twice and it did not work. Sent covermymeds a message to void the outstanding key for this medication.

## 2021-05-12 NOTE — Telephone Encounter (Signed)
CoverMyMeds [facility]) called, following up to make sure PA for Nurtec has been sent. Would like a call from the nurse.  Contact info: [phone] Reference key: [id number]

## 2021-07-12 ENCOUNTER — Other Ambulatory Visit (INDEPENDENT_AMBULATORY_CARE_PROVIDER_SITE_OTHER): Payer: Medicaid Other

## 2021-07-12 ENCOUNTER — Other Ambulatory Visit: Payer: Self-pay

## 2021-07-12 ENCOUNTER — Other Ambulatory Visit (HOSPITAL_COMMUNITY)
Admission: RE | Admit: 2021-07-12 | Discharge: 2021-07-12 | Disposition: A | Discharge status: Home / Self Care | Payer: Medicaid Other | Source: Ambulatory Visit | Attending: Obstetrics & Gynecology | Admitting: Obstetrics & Gynecology

## 2021-07-12 DIAGNOSIS — R399 Unspecified symptoms and signs involving the genitourinary system: Secondary | ICD-10-CM

## 2021-07-12 DIAGNOSIS — Z113 Encounter for screening for infections with a predominantly sexual mode of transmission: Secondary | ICD-10-CM

## 2021-07-12 LAB — POCT URINALYSIS DIPSTICK OB
Blood, UA: NEGATIVE
Glucose, UA: NEGATIVE
Ketones, UA: NEGATIVE
Leukocytes, UA: NEGATIVE
Nitrite, UA: NEGATIVE
POC,PROTEIN,UA: NEGATIVE

## 2021-07-12 NOTE — Progress Notes (Signed)
Chart reviewed for nurse visit. Agree with plan of care.  Estill Dooms, NP 07/12/2021 5:07 PM

## 2021-07-12 NOTE — Progress Notes (Signed)
   NURSE VISIT- VAGINITIS/STD  SUBJECTIVE:  Cindy Stevenson is a 45 y.o. G1P0010 GYN patientfemale here for a vaginal swab for vaginitis screening, STD screen.  She reports the following symptoms: local irritation, pain, and vulvar itching for 1 week. Denies abnormal vaginal bleeding, significant pelvic pain, fever, or UTI symptoms.  OBJECTIVE:  LMP 04/21/2017 Comment: [place]  Appears well, in no apparent distress  ASSESSMENT: Vaginal swab for  vaginitis & STD screening  PLAN: Self-collected vaginal probe for Gonorrhea, Chlamydia, Trichomonas, Bacterial Vaginosis, Yeast sent to lab Treatment: to be determined once results are received Follow-up as needed if symptoms persist/worsen, or new symptoms develop    NURSE VISIT- UTI SYMPTOMS   SUBJECTIVE:  Cindy Stevenson is a 45 y.o. G30P0010 female here for UTI symptoms. She is a GYN patient. She reports lower abdominal pain and urinary urgency.  OBJECTIVE:  LMP 04/21/2017 Comment: [facility]  Appears well, in no apparent distress  Results for orders placed or performed in visit on 07/12/21 (from the past 24 hour(s))  POC Urinalysis Dipstick OB   Collection Time: 07/12/21  4:07 PM  Result Value Ref Range   Color, UA     Clarity, UA     Glucose, UA Negative Negative   Bilirubin, UA     Ketones, UA neg    Spec Grav, UA     Blood, UA neg    pH, UA     POC,PROTEIN,UA Negative Negative, Trace, Small (1+), Moderate (2+), Large (3+), 4+   Urobilinogen, UA     Nitrite, UA neg    Leukocytes, UA Negative Negative   Appearance     Odor      ASSESSMENT: GYN patient with UTI symptoms and negative nitrites  PLAN: Note routed to Derrek Monaco, AGNP   Rx sent by provider today: No Urine culture sent Call or return to clinic prn if these symptoms worsen or fail to improve as anticipated. Follow-up: as needed   Faheem Ziemann A Bharat Antillon  07/12/2021 4:10 PM

## 2021-07-13 LAB — MICROSCOPIC EXAMINATION
Bacteria, UA: NONE SEEN
Casts: NONE SEEN /lpf
WBC, UA: NONE SEEN /hpf (ref 0–5)

## 2021-07-13 LAB — URINALYSIS, ROUTINE W REFLEX MICROSCOPIC
Bilirubin, UA: NEGATIVE
Glucose, UA: NEGATIVE
Ketones, UA: NEGATIVE
Nitrite, UA: NEGATIVE
RBC, UA: NEGATIVE
Specific Gravity, UA: 1.016 (ref 1.005–1.030)
Urobilinogen, Ur: 0.2 mg/dL (ref 0.2–1.0)
pH, UA: 6 (ref 5.0–7.5)

## 2021-07-13 LAB — HIV ANTIBODY (ROUTINE TESTING W REFLEX): HIV Screen 4th Generation wRfx: NONREACTIVE

## 2021-07-13 LAB — RPR: RPR Ser Ql: NONREACTIVE

## 2021-07-13 LAB — HEPATITIS C ANTIBODY: Hep C Virus Ab: <0.1 s/co ratio (ref 0.0–0.9)

## 2021-07-14 LAB — CERVICOVAGINAL ANCILLARY ONLY
Bacterial Vaginitis (gardnerella): NEGATIVE
Candida Glabrata: NEGATIVE
Candida Vaginitis: NEGATIVE
Chlamydia: NEGATIVE
Comment: NEGATIVE
Comment: NEGATIVE
Comment: NEGATIVE
Comment: NEGATIVE
Comment: NEGATIVE
Comment: NORMAL
Neisseria Gonorrhea: NEGATIVE
Trichomonas: NEGATIVE

## 2021-07-15 LAB — URINE CULTURE: Organism ID, Bacteria: NO GROWTH

## 2021-08-11 ENCOUNTER — Ambulatory Visit: Payer: Medicaid Other | Admitting: Adult Health

## 2021-08-20 ENCOUNTER — Other Ambulatory Visit: Payer: Medicaid Other | Admitting: Adult Health

## 2021-10-19 DIAGNOSIS — Z0271 Encounter for disability determination: Secondary | ICD-10-CM

## 2021-11-24 ENCOUNTER — Encounter: Payer: Self-pay | Admitting: Internal Medicine

## 2022-01-24 ENCOUNTER — Ambulatory Visit (INDEPENDENT_AMBULATORY_CARE_PROVIDER_SITE_OTHER): Payer: Medicaid Other | Admitting: Psychiatry

## 2022-01-24 ENCOUNTER — Encounter: Payer: Self-pay | Admitting: Psychiatry

## 2022-01-24 ENCOUNTER — Ambulatory Visit: Payer: Medicaid Other | Admitting: Psychiatry

## 2022-01-24 VITALS — BP 189/119 | HR 85 | Ht 62.0 in | Wt 240.0 lb

## 2022-01-24 DIAGNOSIS — R519 Headache, unspecified: Secondary | ICD-10-CM

## 2022-01-24 DIAGNOSIS — G43119 Migraine with aura, intractable, without status migrainosus: Secondary | ICD-10-CM

## 2022-01-24 DIAGNOSIS — M542 Cervicalgia: Secondary | ICD-10-CM

## 2022-01-24 NOTE — Patient Instructions (Signed)
MRI brain ?Physical therapy for the neck ?Schedule occipital nerve block ?
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
## 2022-01-24 NOTE — Progress Notes (Signed)
? ?Referring:  ?Rosalee Kaufman, PA-C ?[facility] ?[place],  [place] [address] ? ?PCP: ?Rosalee Kaufman, PA-C ? ?Neurology was asked to evaluate Cindy Stevenson, a 46 year old female for a chief complaint of headaches.  Our recommendations of care will be communicated by shared medical record.   ? ?CC:  headaches ? ?History provided from self ? ?HPI:  ?Medical co-morbidities: DM ? ?The patient presents for evaluation of headaches which began in 2011. She previously followed with Dr. Jannifer Franklin and was last seen in clinic for Botox 08/2020. She is concerned because her headaches have worsened considerably since her last MRI in 2021. She is currently having daily headaches. They are described as left sided or retro-orbital sharp pain and pressure, with associated photophobia, phonophobia, and nausea. Occasionally she will have lacrimation of her eye and burning in her nose as well. Headaches can last up to 2 days at a time. She currently takes Emgality, which helped at first but now it feels like it has been less effective. She takes naratriptan as needed. Runs out of naratriptan every month before she can get a refill. She has bought it out of pocket before to get more pills. ? ?Headache History: ?Onset: 2011 ?Triggers: stress, exertion ?Aura: flashing lights ?Location: left sided, vertex, behind the eyes ?Quality/Description: sharp, pressure ?Associated Symptoms: ? Photophobia: yes ? Phonophobia: yes ? Nausea: yes ?Worse with activity?: yes ?Duration of headaches: 2 days ? ?Headache days per month: 30 ?Headache free days per month: 0 ? ?Current Treatment: ?Abortive ?Naratriptan 2.5 ? ?Preventative ?Emgality 120 mg monthly ? ?Prior Therapies                                 ?Prozac ?Paxil ?Cymbalta ?Amitriptyline ?Topamax ?Gabapentin ?Metoprolol ?Lisinopril ?losartan ?Ajovy ?Aimovig ?Emgality ?Botox - worsened neck pain ?naratriptan ?Maxalt ?Imitrex ?Nurtec ? ?LABS: ?CBC ?   ?Component Value Date/Time  ? WBC 13.3  (H) 05/10/2017 0604  ? RBC 3.92 05/10/2017 0604  ? HGB 11.5 (L) 05/10/2017 0604  ? HGB 13.9 08/29/2013 0000  ? HCT 35.7 (L) 05/10/2017 0604  ? HCT 39 08/29/2013 0000  ? PLT 424 (H) 05/10/2017 0604  ? MCV 91.1 05/10/2017 0604  ? MCH 29.3 05/10/2017 0604  ? MCHC 32.2 05/10/2017 0604  ? RDW 12.8 05/10/2017 0604  ? ? ?  Latest Ref Rng & Units 05/10/2017  ?  6:04 AM 05/04/2017  ? 10:00 AM 08/24/2013  ? 12:00 AM  ?CMP  ?Glucose 65 - 99 mg/dL 118   171     ?BUN 6 - 20 mg/dL 13   19     ?Creatinine 0.44 - 1.00 mg/dL 0.83   0.85     ?Sodium 135 - 145 mmol/L 134   134     ?Potassium 3.5 - 5.1 mmol/L 3.9   4.2     ?Chloride 101 - 111 mmol/L 98   101     ?CO2 22 - 32 mmol/L 28   22     ?Calcium 8.9 - 10.3 mg/dL 8.2   9.0     ?Total Protein 6.5 - 8.1 g/dL  6.6     ?Total Bilirubin 0.3 - 1.2 mg/dL  0.6   1.2       ?Alkaline Phos 38 - 126 U/L  70   79       ?AST 15 - 41 U/L  21   13       ?  ALT 14 - 54 U/L  18   15       ?  ? This result is from an external source.  ? ? ? ?IMAGING:  ?MRI brain 08/2020: unremarkable ? ? ?Current Outpatient Medications on File Prior to Visit  ?Medication Sig Dispense Refill  ? ACCU-CHEK GUIDE test strip     ? Accu-Chek Softclix Lancets lancets SMARTSIG:Topical    ? albuterol (VENTOLIN HFA) 108 (90 Base) MCG/ACT inhaler TAKE 2 PUFF(S) INHALATION EVERY 4-6 HOURS AS NEEDED FOR WHEEZING    ? ALPRAZolam (XANAX) 1 MG tablet Take 1 mg by mouth 3 (three) times daily.    ? cetirizine (ZYRTEC) 10 MG tablet Take 10 mg by mouth daily.    ? esomeprazole (NEXIUM) 40 MG capsule Take 40 mg by mouth 2 (two) times daily.     ? gabapentin (NEURONTIN) 400 MG capsule Take 400 mg by mouth 3 (three) times daily.    ? Galcanezumab-gnlm (EMGALITY) 120 MG/ML SOAJ Inject into the skin.    ? hydrochlorothiazide (HYDRODIURIL) 12.5 MG tablet Take 12.5 mg by mouth daily.    ? hydrocortisone 2.5 % cream Apply topically.    ? ibuprofen (ADVIL) 600 MG tablet Take 600 mg by mouth 3 (three) times daily.    ? ketoconazole (NIZORAL) 2 %  shampoo Apply topically every 3 (three) days.    ? naratriptan (AMERGE) 2.5 MG tablet Take 1 tablet (2.5 mg total) by mouth as needed for migraine. Take one (1) tablet at onset of headache; if returns or does not resolve, may repeat after 4 hours; do not exceed five (5) mg in 24 hours. 10 tablet 3  ? ondansetron (ZOFRAN) 4 MG tablet Take 1 tablet by mouth every 8 hours as needed for nausea/vomiting. PLEASE CALL OFFICE AT [phone] to schedule follow up for ongoing refills 20 tablet 0  ? Semaglutide,0.25 or 0.'5MG'$ /DOS, (OZEMPIC, 0.25 OR 0.5 MG/DOSE,) 2 MG/1.5ML SOPN Inject into the skin.    ? SYMBICORT 160-4.5 MCG/ACT inhaler Inhale 2 puffs into the lungs 2 (two) times daily.    ? ?No current facility-administered medications on file prior to visit.  ? ? ? ?Allergies: ?Allergies  ?Allergen Reactions  ? Effexor [Venlafaxine] Rash and Hypertension  ?  SSRI syndrome  ? Latex Dermatitis  ?  Tears skin   ? Codeine Nausea And Vomiting  ?  Can take with nausea meds  ? Shellfish Allergy   ?  Breaks out in Hives  ? Amoxicillin Rash  ? Ceftin [Cefuroxime] Rash  ? ? ?Family History: ?Migraine or other headaches in the family:  mother, maternal aunt, maternal grandfather ?Aneurysms in a first degree relative:  no ?Brain tumors in the family:  mother had tumors in her brain ?Other neurological illness in the family:   no ? ?Past Medical History: ?Past Medical History:  ?Diagnosis Date  ? Anxiety   ? Breast nodule 08/06/2015  ? Breast pain, left 08/06/2015  ? Burning with urination 02/27/2014  ? Common migraine with intractable migraine 06/11/2018  ? Depression   ? Diabetes mellitus without complication ([place])   ? Dysmenorrhea 02/27/2014  ? Fibroadenoma of right breast 08/26/2015  ? GERD (gastroesophageal reflux disease)   ? Headache   ? Hematuria 02/27/2014  ? Hypertension   ? PTSD (post-traumatic stress disorder)   ? Screening for STD (sexually transmitted disease) 03/27/2014  ? Seasonal allergies   ? SUI (stress urinary  incontinence, female) 02/27/2014  ? Vaginal discharge 08/06/2015  ? Warts, genital 01/23/2015  ?  Yeast infection 08/06/2015  ? ? ?Past Surgical History ?Past Surgical History:  ?Procedure Laterality Date  ? BILATERAL SALPINGECTOMY Bilateral 05/09/2017  ? Procedure: BILATERAL SALPINGECTOMY;  Surgeon: Jonnie Kind, MD;  Location: [facility];  Service: Gynecology;  Laterality: Bilateral;  ? COLONOSCOPY N/A 09/10/2013  ? VFI:EPPIR;/JJOAC internal hemorrhoids  ? ESOPHAGOGASTRODUODENOSCOPY N/A 09/10/2013  ? ZYS:AYTKZSWF ring at the gastroesophagral juctions/mild non-erosive gastritis  ? LABIOPLASTY  02/14/2012  ? Procedure: LABIAPLASTY;  Surgeon: Jonnie Kind, MD;  Location: [facility];  Service: Gynecology;  Laterality: Right;  of the right labia minora  ? SUPRACERVICAL ABDOMINAL HYSTERECTOMY N/A 05/09/2017  ? Procedure: HYSTERECTOMY SUPRACERVICAL ABDOMINAL;  Surgeon: Jonnie Kind, MD;  Location: [facility];  Service: Gynecology;  Laterality: N/A;  ? [place]  ? Dr. Owens Shark  ? ? ?Social History: ?Social History  ? ?Tobacco Use  ? Smoking status: Never  ? Smokeless tobacco: Never  ?Vaping Use  ? Vaping Use: Never used  ?Substance Use Topics  ? Alcohol use: No  ? Drug use: No  ? ? ?ROS: ?Negative for fevers, chills. Positive for headaches. All other systems reviewed and negative unless stated otherwise in HPI. ? ? ?Physical Exam:  ? ?Vital Signs: ?BP (!) 189/119   Pulse 85   Ht '5\' 2"'$  (1.575 m)   Wt 240 lb (108.9 kg)   LMP 04/21/2017 Comment: [place]  BMI 43.90 kg/m?  ?GENERAL: well appearing,in no acute distress,alert ?SKIN:  Color, texture, turgor normal. No rashes or lesions ?HEAD:  Normocephalic/atraumatic. ?CV:  RRR ?RESP: Normal respiratory effort ?MSK: +tenderness to palpation over left>R occiput, neck, and shoulders ? ?NEUROLOGICAL: ?Mental Status: Alert, oriented to person, place and time,Follows commands ?Cranial Nerves: PERRL, visual fields intact to confrontation, extraocular movements intact,  facial sensation intact, no facial droop or ptosis, hearing grossly intact, no dysarthria ?Motor: muscle strength 5/5 both upper and lower extremities ?Reflexes: 2+ throughout ?Sensation: intact to light touch all 4 extremitie

## 2022-01-26 ENCOUNTER — Telehealth: Payer: Self-pay | Admitting: Psychiatry

## 2022-01-26 NOTE — Telephone Encounter (Signed)
[facility] pending faxed notes.  °

## 2022-01-27 NOTE — Telephone Encounter (Signed)
[facility] Cindy Stevenson: [id number] (exp. 01/26/22 to 03/27/22) order sent to GI, they will reach out to the patient to schedule.  ?

## 2022-02-15 ENCOUNTER — Encounter: Payer: Self-pay | Admitting: Psychiatry

## 2022-02-15 ENCOUNTER — Ambulatory Visit: Payer: Medicaid Other | Admitting: Psychiatry

## 2022-02-15 VITALS — BP 154/101 | HR 97

## 2022-02-15 DIAGNOSIS — M542 Cervicalgia: Secondary | ICD-10-CM | POA: Diagnosis not present

## 2022-02-15 DIAGNOSIS — M5481 Occipital neuralgia: Secondary | ICD-10-CM

## 2022-02-15 NOTE — Progress Notes (Signed)
Procedure: Occipital Nerve injection/Trigger point injection ? ?Location: bilateral occiput ? ?The risks, benefits and anticipated outcomes of the procedure, the risks and benefits of the alternatives to the procedure, and the roles and tasks of the personnel to be involved, were discussed with the patient, and the patient consents to the procedure and agrees to proceed.   ?  ?A combination of 1 cc betamethasone 6 mg and 4 cc of 0.25% bupivacaine were prepared in 2 syringes (5 cc).  2 trigger points on the splenius capitus were identified and injected. The left and right greater occipital nerves were injected 3cm caudal and 1.5 cm lateral to the inion where the main trunk of the occipital nerve penetrates the semispinalis muscle.  The needle was placed perpendicular and the needle advanced 1.5 cm. After aspiration to ensure no obstruction or presence of blood, the area was injected.  The needle was repositioned in a fan-like manner and the entire area was injected. Pressure was held and no hematoma was noted.  ? ?Genia Harold, MD ?02/15/22 ?2:51 PM ? ?

## 2022-02-18 ENCOUNTER — Ambulatory Visit
Admission: RE | Admit: 2022-02-18 | Discharge: 2022-02-18 | Disposition: A | Discharge status: Home / Self Care | Payer: Medicaid Other | Source: Ambulatory Visit | Attending: Psychiatry | Admitting: Psychiatry

## 2022-02-18 DIAGNOSIS — R519 Headache, unspecified: Secondary | ICD-10-CM

## 2022-02-18 MED ORDER — GADOBENATE DIMEGLUMINE 529 MG/ML IV SOLN
20.0000 mL | Freq: Once | INTRAVENOUS | Status: AC | PRN
  Administered 2022-02-18: 20 mL via INTRAVENOUS

## 2022-02-23 ENCOUNTER — Ambulatory Visit: Payer: Medicaid Other | Admitting: Neurology

## 2022-02-24 ENCOUNTER — Encounter: Payer: Self-pay | Admitting: Psychiatry

## 2022-03-02 ENCOUNTER — Encounter: Payer: Self-pay | Admitting: Psychiatry

## 2022-03-03 ENCOUNTER — Telehealth: Payer: Self-pay | Admitting: *Deleted

## 2022-03-03 NOTE — Telephone Encounter (Signed)
Vyepti infusion orders on Dr Georgina Peer desk for completion.

## 2022-03-10 NOTE — H&P (View-Only) (Signed)
Referring Provider: Rosine Door Primary Care Physician:  Rosine Door Primary Gastroenterologist:  Dr. Abbey Chatters  Chief Complaint  Patient presents with   Hemorrhoids    Bleeding from rectum. Thinks it may be hemorrhoids and constipated     HPI:   Cindy Stevenson is a 46 y.o. female presenting today at the request of Rosalee Kaufman, [place] for rectal bleeding.   History of abdominal pain, GERD, IBS with diarrhea, and rectal bleeding. HIDA scan in August 2012 with gallbladder EF 91% although symptomatic with CCK infusion. Labs and CT without significant findings.  Repeat HIDA and CT in 2017 without significant findings.  EGD and colonoscopy in November 2014.  She was having rectal bleeding at that time.  Colonoscopy with 2 polyps (hyperplastic and benign polypoid mucosa) and internal hemorrhoids.  EGD with mild nonerosive gastritis biopsied, widely open Schatzki's ring at the GE junction.  Pathology with mild chronic gastritis with intestinal metaplasia, negative for H. pylori.  Repeat EGD in 2017 for abdominal pain by Dr. Doristine Mango revealing Schatzki's ring in the distal esophagus, otherwise normal exam. We have not seen patient since 2017.  Today:  Rectal bleeding started back up about 1 year. Was intermittent, but about 5 months ago, symptoms became more frequent. Has had an episode where the entire toilet was full of blood. Most of the time, it is there every time she wipes after a BM. Occasional rectal discomfort. No sharp rectal pain. Not using anything for hemorrhoids. Hemorrhoids will prolapse.   Struggling with constipation when she was on ozempic. Would skip weeks at a time. Had associated discomfort in her sides.  Ozempic has been discontinued.  Bowels move every other day to every 3 days. Taking stool softener every few days. Feels like it it is unpredictable.  States she may have 1 stool or can have multiple stools in a day.  Mild  constipation does predate Ozempic.  States she would skip 2-3 days between bowel movements.  She also has history of abdominal cramping prior to bowel movements that improves thereafter, but the pain was very mild prior to starting Ozempic.  While she was on Ozempic, the abdominal pain worsened.  The emergency room prescribed her dicyclomine 20 mg for her abdominal pain which she is taking twice daily.    Ibuprofen 600 mg up to 3 times daily for TMJ.   GERD is well controlled on Nexium BID. Feels foods getting hung in mid esophagus. Primarily with meats. This has been present for years. Occurs about once a month.  No regurgitation.   Past Medical History:  Diagnosis Date   Anxiety    Breast nodule 08/06/2015   Breast pain, left 08/06/2015   Burning with urination 02/27/2014   Common migraine with intractable migraine 06/11/2018   Depression    Diabetes mellitus without complication ([place])    Dysmenorrhea 02/27/2014   Fibroadenoma of right breast 08/26/2015   GERD (gastroesophageal reflux disease)    Headache    Hematuria 02/27/2014   Hypertension    PTSD (post-traumatic stress disorder)    Screening for STD (sexually transmitted disease) 03/27/2014   Seasonal allergies    SUI (stress urinary incontinence, female) 02/27/2014   Vaginal discharge 08/06/2015   Warts, genital 01/23/2015   Yeast infection 08/06/2015    Past Surgical History:  Procedure Laterality Date   BILATERAL SALPINGECTOMY Bilateral 05/09/2017   Procedure: BILATERAL SALPINGECTOMY;  Surgeon: Jonnie Kind, MD;  Location: [facility];  Service:  Gynecology;  Laterality: Bilateral;   COLONOSCOPY N/A 09/10/2013   ELT:RVUYE;/BXIDH internal hemorrhoids   ESOPHAGOGASTRODUODENOSCOPY N/A 09/10/2013   WYS:HUOHFGBM ring at the gastroesophagral juctions/mild non-erosive gastritis   ESOPHAGOGASTRODUODENOSCOPY  2017   Dr. Britta Mccreedy; Schatzki's ring in the distal esophagus, otherwise normal exam.   LABIOPLASTY  02/14/2012   Procedure:  LABIAPLASTY;  Surgeon: Jonnie Kind, MD;  Location: [facility];  Service: Gynecology;  Laterality: Right;  of the right labia minora   SUPRACERVICAL ABDOMINAL HYSTERECTOMY N/A 05/09/2017   Procedure: HYSTERECTOMY SUPRACERVICAL ABDOMINAL;  Surgeon: Jonnie Kind, MD;  Location: [facility];  Service: Gynecology;  Laterality: N/A;   WISDOM TOOTH EXTRACTION  10/18/1995   Dr. Owens Shark    Current Outpatient Medications  Medication Sig Dispense Refill   ACCU-CHEK GUIDE test strip      Accu-Chek Softclix Lancets lancets SMARTSIG:Topical     albuterol (VENTOLIN HFA) 108 (90 Base) MCG/ACT inhaler TAKE 2 PUFF(S) INHALATION EVERY 4-6 HOURS AS NEEDED FOR WHEEZING     ALPRAZolam (XANAX) 1 MG tablet Take 1 mg by mouth 3 (three) times daily.     cetirizine (ZYRTEC) 10 MG tablet Take 10 mg by mouth daily.     dicyclomine (BENTYL) 20 MG tablet Take 20 mg by mouth 2 (two) times daily.     esomeprazole (NEXIUM) 40 MG capsule Take 40 mg by mouth 2 (two) times daily.      gabapentin (NEURONTIN) 400 MG capsule Take 400 mg by mouth 3 (three) times daily.     Galcanezumab-gnlm (EMGALITY) 120 MG/ML SOAJ Inject into the skin.     hydrochlorothiazide (HYDRODIURIL) 12.5 MG tablet Take 12.5 mg by mouth daily.     hydrocortisone (ANUSOL-HC) 2.5 % rectal cream Place 1 application. rectally 2 (two) times daily. 30 g 1   hydrocortisone 2.5 % cream Apply topically.     ibuprofen (ADVIL) 600 MG tablet Take 600 mg by mouth 3 (three) times daily.     ketoconazole (NIZORAL) 2 % shampoo Apply topically every 3 (three) days.     Multiple Vitamins-Minerals (MULTIPLE VITAMINS/WOMENS PO) Take by mouth.     Na Sulfate-K Sulfate-Mg Sulf 17.5-3.13-1.6 GM/177ML SOLN Take 1 kit by mouth once for 1 dose. 354 mL 0   naratriptan (AMERGE) 2.5 MG tablet Take 1 tablet (2.5 mg total) by mouth as needed for migraine. Take one (1) tablet at onset of headache; if returns or does not resolve, may repeat after 4 hours; do not exceed five (5) mg in 24  hours. 10 tablet 3   ondansetron (ZOFRAN) 8 MG tablet Take 8 mg by mouth every 8 (eight) hours as needed.     SYMBICORT 160-4.5 MCG/ACT inhaler Inhale 2 puffs into the lungs 2 (two) times daily.     Semaglutide (RYBELSUS) 3 MG TABS Take by mouth. (Patient not taking: Reported on 03/11/2022)     No current facility-administered medications for this visit.    Allergies as of 03/11/2022 - Review Complete 03/11/2022  Allergen Reaction Noted   Effexor [venlafaxine] Rash and Hypertension 07/12/2021   Latex Dermatitis 05/09/2016   Codeine Nausea And Vomiting 02/13/2012   Shellfish allergy  02/13/2012   Amoxicillin Rash 01/23/2015   Ceftin [cefuroxime] Rash 12/09/2020    Family History  Problem Relation Age of Onset   Lung cancer Mother    Asthma Sister    Allergies Sister    Mental illness Maternal Aunt        depression, PTSD   Diabetes Maternal Grandmother  Cancer Maternal Grandfather        skin   Colon cancer Neg Hx     Social History   Socioeconomic History   Marital status: Single    Spouse name: Not on file   Number of children: Not on file   Years of education: Not on file   Highest education level: Not on file  Occupational History   Occupation: disability    Employer: NOT EMPLOYED  Tobacco Use   Smoking status: Never   Smokeless tobacco: Never  Vaping Use   Vaping Use: Never used  Substance and Sexual Activity   Alcohol use: No   Drug use: No   Sexual activity: Not Currently    Birth control/protection: Surgical    Comment: [facility]  Other Topics Concern   Not on file  Social History Narrative   Not on file   Social Determinants of Health   Financial Resource Strain: Not on file  Food Insecurity: Not on file  Transportation Needs: Not on file  Physical Activity: Not on file  Stress: Not on file  Social Connections: Not on file  Intimate Partner Violence: Not on file    Review of Systems: Gen: Denies any fever, chills, cold or flulike symptoms,  presyncope, syncope. CV: Denies chest pain, heart palpitations. Resp: Denies shortness of breath, cough. GI: See HPI GU : Denies urinary burning, urinary frequency, urinary hesitancy MS: Denies joint pain. Derm: Denies rash. Psych: Denies depression, anxiety. Heme: See HPI  Physical Exam: BP 128/72   Pulse (!) 102   Temp (!) 97.5 F (36.4 C) (Temporal)   Ht 5' 1" (1.549 m)   Wt 240 lb 6.4 oz (109 kg)   LMP 04/21/2017 Comment: [place]  BMI 45.42 kg/m  General:   Alert and oriented. Pleasant and cooperative. Well-nourished and well-developed.  Head:  Normocephalic and atraumatic. Eyes:  Without icterus, sclera clear and conjunctiva pink.  Ears:  Normal auditory acuity. Lungs:  Clear to auscultation bilaterally. No wheezes, rales, or rhonchi. No distress.  Heart:  S1, S2 present without murmurs appreciated.  Abdomen:  +BS, soft, non-tender and non-distended. No HSM noted. No guarding or rebound. No masses appreciated.  Rectal: External and internal hemorrhoids.  No fissure appreciated.  No BRBPR or melena on gloved exam finger. Msk:  Symmetrical without gross deformities. Normal posture. Extremities:  Without edema. Neurologic:  Alert and  oriented x4;  grossly normal neurologically. Skin:  Intact without significant lesions or rashes. Psych: Normal mood and affect.    Assessment:  46 year old female with history of anxiety, depression, HTN, migraines, diabetes, GERD, hemorrhoids, presenting today at the request of Clemmie Krill, PA-C for further evaluation of rectal bleeding.  Also discussed constipation, GERD, and dysphagia.  Rectal bleeding: History of rectal bleeding in the distant past that returned about 1 year ago.  Symptoms were intermittent, but for the last 5 months, she has been having bright red blood with every bowel movement, typically on toilet tissue.  She has had an episode where blood turned the toilet water red.  Rectal exam with external and internal  hemorrhoids.  I suspect we are dealing with hemorrhoidal bleeding in the setting of constipation which is discussed below.  However, as her last colonoscopy was in 2014, unable to rule out colon polyps or malignancy.  We will treat her with Anusol rectal cream and arrange colonoscopy in the near future.  We will also update CBC.  Constipation:  History of mild constipation that acutely worsened when  she was started on Ozempic.  She was going a week or more without a bowel movement with associated discomfort in her flanks.  Ozempic has since been discontinued, but she is taking dicyclomine 20 mg twice daily which was prescribed for her abdominal pain by the emergency room.  This is likely exacerbating her constipation.  She does have history of IBS and reports having some abdominal cramping prior to a bowel movement intermittently prior to starting Ozempic, but nothing severe.  I have recommended stopping dicyclomine for now and monitoring for recurrent abdominal pain, start Benefiber, and Colace 100 mg daily.  May decrease Colace frequency if needed.  GERD:  Chronic.  Well-controlled on Nexium 40 mg twice daily.  Dysphagia: Chronic intermittent solid food dysphagia with items getting hung in the midesophagus occurring about once a month.  Prior EGDs in 2014 and 2017 have both noted a Schatzki's ring in the distal esophagus, but does not appear her esophagus has ever been dilated.  Suspect Schatzki's ring is likely the etiology of her current symptoms.  We will arrange an EGD with dilation in the near future for further evaluation and therapeutic intervention.   Plan:  CBC. Proceed with colonoscopy and EGD with possible dilation with propofol by Dr. Abbey Chatters in near future. The risks, benefits, and alternatives have been discussed with the patient in detail. The patient states understanding and desires to proceed. ASA 3 Hold morning diabetes medications. Anusol rectal cream twice daily x7-10 days, then  again as needed. Stop dicyclomine and monitor for recurrent abdominal pain. Start Colace 100 mg daily.  May decrease frequency if she develops frequent loose stools. Start Benefiber 2 teaspoons daily x2 weeks then increase to twice daily. Continue Nexium 40 mg twice daily. Dysphagia precautions discussed.  Separate written instructions provided. (See AVS). Follow-up after procedures.   Aliene Altes, PA-C [facility] 03/11/2022

## 2022-03-10 NOTE — Progress Notes (Signed)
 Referring Provider: Skillman, Katherine E, PA-C Primary Care Physician:  Skillman, Katherine E, PA-C Primary Gastroenterologist:  Dr. Carver  Chief Complaint  Patient presents with   Hemorrhoids    Bleeding from rectum. Thinks it may be hemorrhoids and constipated     HPI:   Cindy Stevenson is a 45 y.o. female presenting today at the request of Skillman, Katherine E, PA-C for rectal bleeding.   History of abdominal pain, GERD, IBS with diarrhea, and rectal bleeding. HIDA scan in August 2012 with gallbladder EF 91% although symptomatic with CCK infusion. Labs and CT without significant findings.  Repeat HIDA and CT in 2017 without significant findings.  EGD and colonoscopy in November 2014.  She was having rectal bleeding at that time.  Colonoscopy with 2 polyps (hyperplastic and benign polypoid mucosa) and internal hemorrhoids.  EGD with mild nonerosive gastritis biopsied, widely open Schatzki's ring at the GE junction.  Pathology with mild chronic gastritis with intestinal metaplasia, negative for H. pylori.  Repeat EGD in 2017 for abdominal pain by Dr. Christopher Benson revealing Schatzki's ring in the distal esophagus, otherwise normal exam. We have not seen patient since 2017.  Today:  Rectal bleeding started back up about 1 year. Was intermittent, but about 5 months ago, symptoms became more frequent. Has had an episode where the entire toilet was full of blood. Most of the time, it is there every time she wipes after a BM. Occasional rectal discomfort. No sharp rectal pain. Not using anything for hemorrhoids. Hemorrhoids will prolapse.   Struggling with constipation when she was on ozempic. Would skip weeks at a time. Had associated discomfort in her sides.  Ozempic has been discontinued.  Bowels move every other day to every 3 days. Taking stool softener every few days. Feels like it it is unpredictable.  States she may have 1 stool or can have multiple stools in a day.  Mild  constipation does predate Ozempic.  States she would skip 2-3 days between bowel movements.  She also has history of abdominal cramping prior to bowel movements that improves thereafter, but the pain was very mild prior to starting Ozempic.  While she was on Ozempic, the abdominal pain worsened.  The emergency room prescribed her dicyclomine 20 mg for her abdominal pain which she is taking twice daily.    Ibuprofen 600 mg up to 3 times daily for TMJ.   GERD is well controlled on Nexium BID. Feels foods getting hung in mid esophagus. Primarily with meats. This has been present for years. Occurs about once a month.  No regurgitation.   Past Medical History:  Diagnosis Date   Anxiety    Breast nodule 08/06/2015   Breast pain, left 08/06/2015   Burning with urination 02/27/2014   Common migraine with intractable migraine 06/11/2018   Depression    Diabetes mellitus without complication (HCC)    Dysmenorrhea 02/27/2014   Fibroadenoma of right breast 08/26/2015   GERD (gastroesophageal reflux disease)    Headache    Hematuria 02/27/2014   Hypertension    PTSD (post-traumatic stress disorder)    Screening for STD (sexually transmitted disease) 03/27/2014   Seasonal allergies    SUI (stress urinary incontinence, female) 02/27/2014   Vaginal discharge 08/06/2015   Warts, genital 01/23/2015   Yeast infection 08/06/2015    Past Surgical History:  Procedure Laterality Date   BILATERAL SALPINGECTOMY Bilateral 05/09/2017   Procedure: BILATERAL SALPINGECTOMY;  Surgeon: Ferguson, John V, MD;  Location: [facility];  Service:   Gynecology;  Laterality: Bilateral;   COLONOSCOPY N/A 09/10/2013   ELT:RVUYE;/BXIDH internal hemorrhoids   ESOPHAGOGASTRODUODENOSCOPY N/A 09/10/2013   WYS:HUOHFGBM ring at the gastroesophagral juctions/mild non-erosive gastritis   ESOPHAGOGASTRODUODENOSCOPY  2017   Dr. Britta Mccreedy; Schatzki's ring in the distal esophagus, otherwise normal exam.   LABIOPLASTY  02/14/2012   Procedure:  LABIAPLASTY;  Surgeon: Jonnie Kind, MD;  Location: [facility];  Service: Gynecology;  Laterality: Right;  of the right labia minora   SUPRACERVICAL ABDOMINAL HYSTERECTOMY N/A 05/09/2017   Procedure: HYSTERECTOMY SUPRACERVICAL ABDOMINAL;  Surgeon: Jonnie Kind, MD;  Location: [facility];  Service: Gynecology;  Laterality: N/A;   WISDOM TOOTH EXTRACTION  10/18/1995   Dr. Owens Shark    Current Outpatient Medications  Medication Sig Dispense Refill   ACCU-CHEK GUIDE test strip      Accu-Chek Softclix Lancets lancets SMARTSIG:Topical     albuterol (VENTOLIN HFA) 108 (90 Base) MCG/ACT inhaler TAKE 2 PUFF(S) INHALATION EVERY 4-6 HOURS AS NEEDED FOR WHEEZING     ALPRAZolam (XANAX) 1 MG tablet Take 1 mg by mouth 3 (three) times daily.     cetirizine (ZYRTEC) 10 MG tablet Take 10 mg by mouth daily.     dicyclomine (BENTYL) 20 MG tablet Take 20 mg by mouth 2 (two) times daily.     esomeprazole (NEXIUM) 40 MG capsule Take 40 mg by mouth 2 (two) times daily.      gabapentin (NEURONTIN) 400 MG capsule Take 400 mg by mouth 3 (three) times daily.     Galcanezumab-gnlm (EMGALITY) 120 MG/ML SOAJ Inject into the skin.     hydrochlorothiazide (HYDRODIURIL) 12.5 MG tablet Take 12.5 mg by mouth daily.     hydrocortisone (ANUSOL-HC) 2.5 % rectal cream Place 1 application. rectally 2 (two) times daily. 30 g 1   hydrocortisone 2.5 % cream Apply topically.     ibuprofen (ADVIL) 600 MG tablet Take 600 mg by mouth 3 (three) times daily.     ketoconazole (NIZORAL) 2 % shampoo Apply topically every 3 (three) days.     Multiple Vitamins-Minerals (MULTIPLE VITAMINS/WOMENS PO) Take by mouth.     Na Sulfate-K Sulfate-Mg Sulf 17.5-3.13-1.6 GM/177ML SOLN Take 1 kit by mouth once for 1 dose. 354 mL 0   naratriptan (AMERGE) 2.5 MG tablet Take 1 tablet (2.5 mg total) by mouth as needed for migraine. Take one (1) tablet at onset of headache; if returns or does not resolve, may repeat after 4 hours; do not exceed five (5) mg in 24  hours. 10 tablet 3   ondansetron (ZOFRAN) 8 MG tablet Take 8 mg by mouth every 8 (eight) hours as needed.     SYMBICORT 160-4.5 MCG/ACT inhaler Inhale 2 puffs into the lungs 2 (two) times daily.     Semaglutide (RYBELSUS) 3 MG TABS Take by mouth. (Patient not taking: Reported on 03/11/2022)     No current facility-administered medications for this visit.    Allergies as of 03/11/2022 - Review Complete 03/11/2022  Allergen Reaction Noted   Effexor [venlafaxine] Rash and Hypertension 07/12/2021   Latex Dermatitis 05/09/2016   Codeine Nausea And Vomiting 02/13/2012   Shellfish allergy  02/13/2012   Amoxicillin Rash 01/23/2015   Ceftin [cefuroxime] Rash 12/09/2020    Family History  Problem Relation Age of Onset   Lung cancer Mother    Asthma Sister    Allergies Sister    Mental illness Maternal Aunt        depression, PTSD   Diabetes Maternal Grandmother  Cancer Maternal Grandfather        skin   Colon cancer Neg Hx     Social History   Socioeconomic History   Marital status: Single    Spouse name: Not on file   Number of children: Not on file   Years of education: Not on file   Highest education level: Not on file  Occupational History   Occupation: disability    Employer: NOT EMPLOYED  Tobacco Use   Smoking status: Never   Smokeless tobacco: Never  Vaping Use   Vaping Use: Never used  Substance and Sexual Activity   Alcohol use: No   Drug use: No   Sexual activity: Not Currently    Birth control/protection: Surgical    Comment: [facility]  Other Topics Concern   Not on file  Social History Narrative   Not on file   Social Determinants of Health   Financial Resource Strain: Not on file  Food Insecurity: Not on file  Transportation Needs: Not on file  Physical Activity: Not on file  Stress: Not on file  Social Connections: Not on file  Intimate Partner Violence: Not on file    Review of Systems: Gen: Denies any fever, chills, cold or flulike symptoms,  presyncope, syncope. CV: Denies chest pain, heart palpitations. Resp: Denies shortness of breath, cough. GI: See HPI GU : Denies urinary burning, urinary frequency, urinary hesitancy MS: Denies joint pain. Derm: Denies rash. Psych: Denies depression, anxiety. Heme: See HPI  Physical Exam: BP 128/72   Pulse (!) 102   Temp (!) 97.5 F (36.4 C) (Temporal)   Ht 5' 1" (1.549 m)   Wt 240 lb 6.4 oz (109 kg)   LMP 04/21/2017 Comment: Peter  BMI 45.42 kg/m  General:   Alert and oriented. Pleasant and cooperative. Well-nourished and well-developed.  Head:  Normocephalic and atraumatic. Eyes:  Without icterus, sclera clear and conjunctiva pink.  Ears:  Normal auditory acuity. Lungs:  Clear to auscultation bilaterally. No wheezes, rales, or rhonchi. No distress.  Heart:  S1, S2 present without murmurs appreciated.  Abdomen:  +BS, soft, non-tender and non-distended. No HSM noted. No guarding or rebound. No masses appreciated.  Rectal: External and internal hemorrhoids.  No fissure appreciated.  No BRBPR or melena on gloved exam finger. Msk:  Symmetrical without gross deformities. Normal posture. Extremities:  Without edema. Neurologic:  Alert and  oriented x4;  grossly normal neurologically. Skin:  Intact without significant lesions or rashes. Psych: Normal mood and affect.    Assessment:  46 year old female with history of anxiety, depression, HTN, migraines, diabetes, GERD, hemorrhoids, presenting today at the request of Clemmie Krill, PA-C for further evaluation of rectal bleeding.  Also discussed constipation, GERD, and dysphagia.  Rectal bleeding: History of rectal bleeding in the distant past that returned about 1 year ago.  Symptoms were intermittent, but for the last 5 months, she has been having bright red blood with every bowel movement, typically on toilet tissue.  She has had an episode where blood turned the toilet water red.  Rectal exam with external and internal  hemorrhoids.  I suspect we are dealing with hemorrhoidal bleeding in the setting of constipation which is discussed below.  However, as her last colonoscopy was in 2014, unable to rule out colon polyps or malignancy.  We will treat her with Anusol rectal cream and arrange colonoscopy in the near future.  We will also update CBC.  Constipation:  History of mild constipation that acutely worsened when  she was started on Ozempic.  She was going a week or more without a bowel movement with associated discomfort in her flanks.  Ozempic has since been discontinued, but she is taking dicyclomine 20 mg twice daily which was prescribed for her abdominal pain by the emergency room.  This is likely exacerbating her constipation.  She does have history of IBS and reports having some abdominal cramping prior to a bowel movement intermittently prior to starting Ozempic, but nothing severe.  I have recommended stopping dicyclomine for now and monitoring for recurrent abdominal pain, start Benefiber, and Colace 100 mg daily.  May decrease Colace frequency if needed.  GERD:  Chronic.  Well-controlled on Nexium 40 mg twice daily.  Dysphagia: Chronic intermittent solid food dysphagia with items getting hung in the midesophagus occurring about once a month.  Prior EGDs in 2014 and 2017 have both noted a Schatzki's ring in the distal esophagus, but does not appear her esophagus has ever been dilated.  Suspect Schatzki's ring is likely the etiology of her current symptoms.  We will arrange an EGD with dilation in the near future for further evaluation and therapeutic intervention.   Plan:  CBC. Proceed with colonoscopy and EGD with possible dilation with propofol by Dr. Carver in near future. The risks, benefits, and alternatives have been discussed with the patient in detail. The patient states understanding and desires to proceed. ASA 3 Hold morning diabetes medications. Anusol rectal cream twice daily x7-10 days, then  again as needed. Stop dicyclomine and monitor for recurrent abdominal pain. Start Colace 100 mg daily.  May decrease frequency if she develops frequent loose stools. Start Benefiber 2 teaspoons daily x2 weeks then increase to twice daily. Continue Nexium 40 mg twice daily. Dysphagia precautions discussed.  Separate written instructions provided. (See AVS). Follow-up after procedures.   Trustin Chapa, PA-C [facility] 03/11/2022   

## 2022-03-11 ENCOUNTER — Encounter: Payer: Self-pay | Admitting: Gastroenterology

## 2022-03-11 ENCOUNTER — Telehealth: Payer: Self-pay | Admitting: *Deleted

## 2022-03-11 ENCOUNTER — Ambulatory Visit (INDEPENDENT_AMBULATORY_CARE_PROVIDER_SITE_OTHER): Payer: Medicaid Other | Admitting: Gastroenterology

## 2022-03-11 ENCOUNTER — Encounter: Payer: Self-pay | Admitting: *Deleted

## 2022-03-11 VITALS — BP 128/72 | HR 102 | Temp 97.5°F | Ht 61.0 in | Wt 240.4 lb

## 2022-03-11 DIAGNOSIS — K219 Gastro-esophageal reflux disease without esophagitis: Secondary | ICD-10-CM | POA: Diagnosis not present

## 2022-03-11 DIAGNOSIS — K625 Hemorrhage of anus and rectum: Secondary | ICD-10-CM

## 2022-03-11 DIAGNOSIS — R1310 Dysphagia, unspecified: Secondary | ICD-10-CM | POA: Diagnosis not present

## 2022-03-11 DIAGNOSIS — K649 Unspecified hemorrhoids: Secondary | ICD-10-CM

## 2022-03-11 LAB — CBC WITH DIFFERENTIAL/PLATELET
Absolute Monocytes: 827 cells/uL (ref 200–950)
Basophils Absolute: 56 cells/uL (ref 0–200)
Basophils Relative: 0.6 %
Eosinophils Absolute: 291 cells/uL (ref 15–500)
Eosinophils Relative: 3.1 %
HCT: 39.1 % (ref 35.0–45.0)
Hemoglobin: 12.8 g/dL (ref 11.7–15.5)
Lymphs Abs: 4286 cells/uL — ABNORMAL HIGH (ref 850–3900)
MCH: 29.1 pg (ref 27.0–33.0)
MCHC: 32.7 g/dL (ref 32.0–36.0)
MCV: 88.9 fL (ref 80.0–100.0)
MPV: 9.9 fL (ref 7.5–12.5)
Monocytes Relative: 8.8 %
Neutro Abs: 3939 cells/uL (ref 1500–7800)
Neutrophils Relative %: 41.9 %
Platelets: 527 10*3/uL — ABNORMAL HIGH (ref 140–400)
RBC: 4.4 10*6/uL (ref 3.80–5.10)
RDW: 11.9 % (ref 11.0–15.0)
Total Lymphocyte: 45.6 %
WBC: 9.4 10*3/uL (ref 3.8–10.8)

## 2022-03-11 MED ORDER — HYDROCORTISONE (PERIANAL) 2.5 % EX CREA: 1.0000 "application " | TOPICAL_CREAM | Freq: Two times a day (BID) | CUTANEOUS | 1 refills | Status: DC

## 2022-03-11 MED ORDER — NA SULFATE-K SULFATE-MG SULF 17.5-3.13-1.6 GM/177ML PO SOLN: 1.0000 | Freq: Once | ORAL | 0 refills | Status: AC

## 2022-03-11 NOTE — Patient Instructions (Signed)
Please have blood work completed at [facility].  We will arrange you to have an upper endoscopy with possible stretching of your esophagus and colonoscopy in the near future with Dr. Abbey Chatters. You may take Rybelsus as prescribed today prior to your procedures. The morning of your procedure, do not take Rybelsus. If you are started on any new diabetes medications prior to your procedures, you will need to let us know.  Use Anusol rectal cream twice daily for the next 7-10 days, then as needed for rectal bleeding/hemorrhoids.  For constipation: Stop dicyclomine.  Monitor for recurrent abdominal cramping and let me know if this occurs. Start Colace (docusate sodium) 100 mg daily. Start Benefiber 2 teaspoons daily x2 weeks, then increase to twice daily. If you have persistent frequent loose stools, you may try decreasing the frequency of Colace, but should continue Benefiber.  For reflux: Continue Nexium 40 mg twice daily  Swallowing precautions:  Eat slowly, take small bites, chew thoroughly, drink plenty of liquids throughout meals.  Avoid trough textures All meats should be chopped finely.  If something gets hung in your esophagus and will not come up or go down, proceed to the emergency room.     We will plan to see you back in the office after your procedures.  Do not hesitate to call if you have any questions or concerns prior to your next visit.  It was very nice meeting you today!  Aliene Altes, PA-C [facility]

## 2022-03-11 NOTE — Telephone Encounter (Signed)
PA submitted via [facility].  Reference Number: [id number]

## 2022-03-11 NOTE — Telephone Encounter (Signed)
Called pt. She has been scheduled for TCS/EGD +/-ed with Dr. Abbey Chatters ASA 3 on 6/8 at 9:30am. Aware will send her prep instructions to her mychart and her pre-op appt. Aware will send prep rx to pharmacy

## 2022-03-15 ENCOUNTER — Other Ambulatory Visit: Payer: Self-pay

## 2022-03-15 ENCOUNTER — Telehealth: Payer: Self-pay

## 2022-03-15 DIAGNOSIS — G43119 Migraine with aura, intractable, without status migrainosus: Secondary | ICD-10-CM

## 2022-03-15 NOTE — Telephone Encounter (Signed)
-----   Message from Genia Harold, MD sent at 03/15/2022 12:57 PM EDT ----- Regarding: Cindy Stevenson from infusions reached out to  let me know that they can't do Cindy Stevenson's Vyepti infusions because she only has Medicaid. Can we see if there are other location options for her to get Vyepti? Thank you!

## 2022-03-15 NOTE — Telephone Encounter (Signed)
Received fax from [facility] Dr. Abbey Chatters not in network. Spoke with Provider services and was advised Dr. Abbey Chatters contract ended with them on 02/21/22.

## 2022-03-15 NOTE — Telephone Encounter (Signed)
I have reached out to the pt care center and was advised they could not accommodate the pt's infusion. Will try another site within [place] and update accordingly.   I called the [facility] located on [address] and spoke with Clay. She confirmed vyepti is a medication that is infused at their clinic.  She asked for a hospital order to be placed or a fax be sent. Will work on this and update accordingly.

## 2022-03-16 ENCOUNTER — Telehealth: Payer: Self-pay | Admitting: Pharmacy Technician

## 2022-03-16 ENCOUNTER — Other Ambulatory Visit: Payer: Self-pay | Admitting: Psychiatry

## 2022-03-16 ENCOUNTER — Telehealth: Payer: Self-pay | Admitting: Psychiatry

## 2022-03-16 ENCOUNTER — Other Ambulatory Visit: Payer: Self-pay

## 2022-03-16 NOTE — Telephone Encounter (Signed)
[facility](Steve) calling to verify infusion order for the patient. Would like a  call from the nurse.

## 2022-03-16 NOTE — Telephone Encounter (Signed)
Cindy Stevenson with infusion center messaged me on this order. He has sent e-mail instructions on how to place orders. Will send message to Dr. Billey Gosling on this.

## 2022-03-16 NOTE — Telephone Encounter (Addendum)
Hospital orders have been placed hopefully the pt can get infusion completed through the [facility] on [facility].  Per conversation I had with Tye Maryland yesterday, pharmacy tech at their site will complete and obtain prior authorization.    I have also faxed a statement to the infusion center letting them know we have placed the orders and to call if anything is incorrect.

## 2022-03-16 NOTE — Telephone Encounter (Signed)
Please see sperate telephone note from today on the status of this.

## 2022-03-16 NOTE — Telephone Encounter (Addendum)
Auth Submission: APPROVED VYEPTI (PAP - FREE DRUG PROGRAM)     Payer: [employer] Medication & CPT/J Code(s) submitted: Vyepti (Eptinezumab) [id number] Route of submission (phone, fax, portal): PHONE: [phone] FAX: [phone]  Forms have been faxed to Dr. Billey Gosling office for signature. Phone: [phone] Fax: [phone]  [employer] APPROVED: 03/25/22 - 10/16/22 CASE: [id number] PHONE: [phone] FAX: [phone]  Patient will be scheduled as soon as possible

## 2022-03-17 NOTE — Telephone Encounter (Signed)
PA approved. Auth# [id number], DOS 03/24/22-05/23/22

## 2022-03-17 NOTE — Telephone Encounter (Signed)
Dr. Billey Gosling was able to enter the vyepti order in epic. Orders successfully received by infusion center.  Today we received PAP pw for Dr. Billey Gosling to review and sign off on.  PW completed and faxed back to infusion suite, fax # [phone]. Confirmation received.

## 2022-03-18 NOTE — Patient Instructions (Signed)
Cindy Stevenson  03/18/2022     '@[email or web address]'$ @   Your procedure is scheduled on 03/24/22.  Report to [facility] at 0730 A.M.  Call this number if you have problems the morning of surgery:  [phone]   Remember:  Do not eat or drink after midnight.   Take these medicines the morning of surgery with A SIP OF WATER zyrtec, nexium, amerge. You may take your xanax & zofran if needed. Please use you albuterol inhaler & symbicort inhaler before you come.    Do not wear jewelry, make-up or nail polish.  Do not wear lotions, powders, or perfumes, or deodorant.  Do not shave 48 hours prior to surgery.  Men may shave face and neck.  Do not bring valuables to the hospital.  [facility] is not responsible for any belongings or valuables.  Contacts, dentures or bridgework may not be worn into surgery.  Leave your suitcase in the car.  After surgery it may be brought to your room.  For patients admitted to the hospital, discharge time will be determined by your treatment team.  Patients discharged the day of surgery will not be allowed to drive home.   Name and phone number of your driver:   family Special instructions:   FOLLOW DIET & PREP INSTRUCTIONS PROVIDED TO YOU FROM DOCTOR'S OFFICE.  Please read over the following fact sheets that you were given. Anesthesia Post-op Instructions and Care and Recovery After Surgery      PATIENT INSTRUCTIONS POST-ANESTHESIA  IMMEDIATELY FOLLOWING SURGERY:  Do not drive or operate machinery for the first twenty four hours after surgery.  Do not make any important decisions for twenty four hours after surgery or while taking narcotic pain medications or sedatives.  If you develop intractable nausea and vomiting or a severe headache please notify your doctor immediately.  FOLLOW-UP:  Please make an appointment with your surgeon as instructed. You do not need to follow up with anesthesia unless specifically instructed to do so.  WOUND CARE  INSTRUCTIONS (if applicable):  Keep a dry clean dressing on the anesthesia/puncture wound site if there is drainage.  Once the wound has quit draining you may leave it open to air.  Generally you should leave the bandage intact for twenty four hours unless there is drainage.  If the epidural site drains for more than 36-48 hours please call the anesthesia department.  QUESTIONS?:  Please feel free to call your physician or the hospital operator if you have any questions, and they will be happy to assist you.      Upper Endoscopy, Adult, Care After This sheet gives you information about how to care for yourself after your procedure. Your health care provider may also give you more specific instructions. If you have problems or questions, contact your health care provider. What can I expect after the procedure? After the procedure, it is common to have: A sore throat. Mild stomach pain or discomfort. Bloating. Nausea. Follow these instructions at home:  Follow instructions from your health care provider about what to eat or drink after your procedure. Return to your normal activities as told by your health care provider. Ask your health care provider what activities are safe for you. Take over-the-counter and prescription medicines only as told by your health care provider. If you were given a sedative during the procedure, it can affect you for several hours. Do not drive or operate machinery until your health care provider says that it is safe.  Keep all follow-up visits as told by your health care provider. This is important. Contact a health care provider if you have: A sore throat that lasts longer than one day. Trouble swallowing. Get help right away if: You vomit blood or your vomit looks like coffee grounds. You have: A fever. Bloody, black, or tarry stools. A severe sore throat or you cannot swallow. Difficulty breathing. Severe pain in your chest or abdomen. Summary After the  procedure, it is common to have a sore throat, mild stomach discomfort, bloating, and nausea. If you were given a sedative during the procedure, it can affect you for several hours. Do not drive or operate machinery until your health care provider says that it is safe. Follow instructions from your health care provider about what to eat or drink after your procedure. Return to your normal activities as told by your health care provider. This information is not intended to replace advice given to you by your health care provider. Make sure you discuss any questions you have with your health care provider. Document Revised: 08/09/2019 Document Reviewed: 03/05/2018 Elsevier Patient Education  [place] Endoscopy, Adult Upper endoscopy is a procedure to look inside the upper GI (gastrointestinal) tract. The upper GI tract is made up of: The esophagus. This is the part of the body that moves food from your mouth to your stomach. The stomach. The duodenum. This is the first part of your small intestine. This procedure is also called esophagogastroduodenoscopy (EGD) or gastroscopy. In this procedure, your health care provider passes a thin, flexible tube (endoscope) through your mouth and down your esophagus into your stomach and into your duodenum. A small camera is attached to the end of the tube. Images from the camera appear on a monitor in the exam room. During this procedure, your health care provider may also remove a small piece of tissue to be sent to a lab and examined under a microscope (biopsy). Your health care provider may do an upper endoscopy to diagnose cancers of the upper GI tract. You may also have this procedure to find the cause of other conditions, such as: Stomach pain. Heartburn. Pain or problems when swallowing. Nausea and vomiting. Stomach bleeding. Stomach ulcers. Tell a health care provider about: Any allergies you have. All medicines you are taking,  including vitamins, herbs, eye drops, creams, and over-the-counter medicines. Any problems you or family members have had with anesthetic medicines. Any bleeding problems you have. Any surgeries you have had. Any medical conditions you have. Whether you are pregnant or may be pregnant. What are the risks? Generally, this is a safe procedure. However, problems may occur, including: Infection. Bleeding. Allergic reactions to medicines. A tear or hole (perforation) in the esophagus, stomach, or duodenum. What happens before the procedure? When to stop eating and drinking Follow instructions from your health care provider about what you may eat and drink before your procedure. These may include: 8 hours before your procedure Stop eating most foods. Do not eat meat, fried foods, or fatty foods. Eat only light foods, such as toast or crackers. All liquids are okay except energy drinks and alcohol. 6 hours before your procedure Stop eating. Drink only clear liquids, such as water, clear fruit juice, black coffee, plain tea, and sports drinks. Do not drink energy drinks or alcohol. 2 hours before your procedure Stop drinking all liquids. You may be allowed to take medicines with small sips of water. If you do not follow your health  care provider's instructions, your procedure may be delayed or canceled. Medicines Ask your health care provider about: Changing or stopping your regular medicines. This is especially important if you are taking diabetes medicines or blood thinners. Taking medicines such as aspirin and ibuprofen. These medicines can thin your blood. Do not take these medicines unless your health care provider tells you to take them. Taking over-the-counter medicines, vitamins, herbs, and supplements. General instructions If you will be going home right after the procedure, plan to have a responsible adult: Take you home from the hospital or clinic. You will not be allowed to  drive. Care for you for the time you are told. What happens during the procedure?  An IV will be inserted into one of your veins. You may be given one or more of the following: A medicine to help you relax (sedative). A medicine to numb the throat (local anesthetic). You will lie on your left side on an exam table. Your health care provider will pass the endoscope through your mouth and down your esophagus. Your health care provider will use the scope to check the inside of your esophagus, stomach, and duodenum. Biopsies may be taken. The endoscope will be removed. The procedure may vary among health care providers and hospitals. What can I expect after the procedure? After your procedure, it is common to have: A sore throat. Mild stomach pain or discomfort. Bloating. Nausea. When your throat is no longer numb, you may be given some fluids to drink. Your blood pressure, heart rate, breathing rate, and blood oxygen level will be monitored until you leave the hospital or clinic. It is up to you to get the results of your procedure. Ask your health care provider, or the department that is doing the procedure, when your results will be ready. Follow these instructions at home:  Follow instructions from your health care provider about eating or drinking restrictions. Take over-the-counter and prescription medicines only as told by your health care provider. Return to your normal activities as told by your health care provider. Ask your health care provider what activities are safe for you. If you were given a sedative during the procedure, it can affect you for several hours. Do not drive or operate machinery until your health care provider says that it is safe. Keep all follow-up visits. This is important. Contact a health care provider if: You have a sore throat that lasts longer than 1 day. You have trouble swallowing. Get help right away if: You vomit blood or your vomit looks like  coffee grounds. You have a fever. You have bloody, black, or tarry stools. You have a severe sore throat or you cannot swallow. You have difficulty breathing or severe pain in your chest. These symptoms may be an emergency. Get help right away. Call 911. Do not wait to see if the symptoms will go away. Do not drive yourself to the hospital. Summary Upper endoscopy is a procedure to look inside the upper GI tract. During the procedure, an IV will be inserted into one of your veins. You may be given a medicine to help you relax. The endoscope will be passed through your mouth and down your esophagus. After the procedure, follow instructions from your health care provider about eating or drinking restrictions. This information is not intended to replace advice given to you by your health care provider. Make sure you discuss any questions you have with your health care provider. Document Revised: 06/29/2021 Document Reviewed: 06/29/2021 Elsevier  Patient Education  [place]. Esophageal Dilatation Esophageal dilatation, also called esophageal dilation, is a procedure to widen or open a blocked or narrowed part of the esophagus. The esophagus is the part of the body that moves food and liquid from the mouth to the stomach. You may need this procedure if: You have a buildup of scar tissue in your esophagus that makes it difficult, painful, or impossible to swallow. This can be caused by gastroesophageal reflux disease (GERD). You have cancer of the esophagus. There is a problem with how food moves through your esophagus. In some cases, you may need this procedure repeated at a later time to dilate the esophagus gradually. Tell a health care provider about: Any allergies you have. All medicines you are taking, including vitamins, herbs, eye drops, creams, and over-the-counter medicines. Any problems you or family members have had with anesthetic medicines. Any blood disorders you  have. Any surgeries you have had. Any medical conditions you have. Any antibiotic medicines you are required to take before dental procedures. Whether you are pregnant or may be pregnant. What are the risks? Generally, this is a safe procedure. However, problems may occur, including: Bleeding due to a tear in the lining of the esophagus. A hole, or perforation, in the esophagus. What happens before the procedure? Ask your health care provider about: Changing or stopping your regular medicines. This is especially important if you are taking diabetes medicines or blood thinners. Taking medicines such as aspirin and ibuprofen. These medicines can thin your blood. Do not take these medicines unless your health care provider tells you to take them. Taking over-the-counter medicines, vitamins, herbs, and supplements. Follow instructions from your health care provider about eating or drinking restrictions. Plan to have a responsible adult take you home from the hospital or clinic. Plan to have a responsible adult care for you for the time you are told after you leave the hospital or clinic. This is important. What happens during the procedure? You may be given a medicine to help you relax (sedative). A numbing medicine may be sprayed into the back of your throat, or you may gargle the medicine. Your health care provider may perform the dilatation using various surgical instruments, such as: Simple dilators. This instrument is carefully placed in the esophagus to stretch it. Guided wire bougies. This involves using an endoscope to insert a wire into the esophagus. A dilator is passed over this wire to enlarge the esophagus. Then the wire is removed. Balloon dilators. An endoscope with a small balloon is inserted into the esophagus. The balloon is inflated to stretch the esophagus and open it up. The procedure may vary among health care providers and hospitals. What can I expect after the  procedure? Your blood pressure, heart rate, breathing rate, and blood oxygen level will be monitored until you leave the hospital or clinic. Your throat may feel slightly sore and numb. This will get better over time. You will not be allowed to eat or drink until your throat is no longer numb. When you are able to drink, urinate, and sit on the edge of the bed without nausea or dizziness, you may be able to return home. Follow these instructions at home: Take over-the-counter and prescription medicines only as told by your health care provider. If you were given a sedative during the procedure, it can affect you for several hours. Do not drive or operate machinery until your health care provider says that it is safe. Plan to  have a responsible adult care for you for the time you are told. This is important. Follow instructions from your health care provider about any eating or drinking restrictions. Do not use any products that contain nicotine or tobacco, such as cigarettes, e-cigarettes, and chewing tobacco. If you need help quitting, ask your health care provider. Keep all follow-up visits. This is important. Contact a health care provider if: You have a fever. You have pain that is not relieved by medicine. Get help right away if: You have chest pain. You have trouble breathing. You have trouble swallowing. You vomit blood. You have black, tarry, or bloody stools. These symptoms may represent a serious problem that is an emergency. Do not wait to see if the symptoms will go away. Get medical help right away. Call your local emergency services (911 in the [place] Do not drive yourself to the hospital. Summary Esophageal dilatation, also called esophageal dilation, is a procedure to widen or open a blocked or narrowed part of the esophagus. Plan to have a responsible adult take you home from the hospital or clinic. For this procedure, a numbing medicine may be sprayed into the back of your  throat, or you may gargle the medicine. Do not drive or operate machinery until your health care provider says that it is safe. This information is not intended to replace advice given to you by your health care provider. Make sure you discuss any questions you have with your health care provider. Document Revised: 02/19/2020 Document Reviewed: 02/19/2020 Elsevier Patient Education  [place]. Colonoscopy, Adult A colonoscopy is a procedure to look at the entire large intestine. This procedure is done using a long, thin, flexible tube that has a camera on the end. You may have a colonoscopy: As a part of normal colorectal screening. If you have certain symptoms, such as: A low number of red blood cells in your blood (anemia). Diarrhea that does not go away. Pain in your abdomen. Blood in your stool. A colonoscopy can help screen for and diagnose medical problems, including: An abnormal growth of cells or tissue (tumor). Abnormal growths within the lining of your intestine (polyps). Inflammation. Areas of bleeding. Tell your health care provider about: Any allergies you have. All medicines you are taking, including vitamins, herbs, eye drops, creams, and over-the-counter medicines. Any problems you or family members have had with anesthetic medicines. Any bleeding problems you have. Any surgeries you have had. Any medical conditions you have. Any problems you have had with having bowel movements. Whether you are pregnant or may be pregnant. What are the risks? Generally, this is a safe procedure. However, problems may occur, including: Bleeding. Damage to your intestine. Allergic reactions to medicines given during the procedure. Infection. This is rare. What happens before the procedure? Eating and drinking restrictions Follow instructions from your health care provider about eating or drinking restrictions, which may include: A few days before the procedure: Follow a  low-fiber diet. Avoid nuts, seeds, dried fruit, raw fruits, and vegetables. 1-3 days before the procedure: Eat only gelatin dessert or ice pops. Drink only clear liquids, such as water, clear juice, clear broth or bouillon, black coffee or tea, or clear soft drinks or sports drinks. Avoid liquids that contain red or purple dye. The day of the procedure: Do not eat solid foods. You may continue to drink clear liquids until up to 2 hours before the procedure. Do not eat or drink anything starting 2 hours before the procedure, or  within the time period that your health care provider recommends. Bowel prep If you were prescribed a bowel prep to take by mouth (orally) to clean out your colon: Take it as told by your health care provider. Starting the day before your procedure, you will need to drink a large amount of liquid medicine. The liquid will cause you to have many bowel movements of loose stool until your stool becomes almost clear or light green. If your skin or the opening between the buttocks (anus) gets irritated from diarrhea, you may relieve the irritation using: Wipes with medicine in them, such as adult wet wipes with aloe and vitamin E. A product to soothe skin, such as petroleum jelly. If you vomit while drinking the bowel prep: Take a break for up to 60 minutes. Begin the bowel prep again. Call your health care provider if you keep vomiting or you cannot take the bowel prep without vomiting. To clean out your colon, you may also be given: Laxative medicines. These help you have a bowel movement. Instructions for enema use. An enema is liquid medicine injected into your rectum. Medicines Ask your health care provider about: Changing or stopping your regular medicines or supplements. This is especially important if you are taking iron supplements, diabetes medicines, or blood thinners. Taking medicines such as aspirin and ibuprofen. These medicines can thin your blood. Do not  take these medicines unless your health care provider tells you to take them. Taking over-the-counter medicines, vitamins, herbs, and supplements. General instructions Ask your health care provider what steps will be taken to help prevent infection. These may include washing skin with a germ-killing soap. If you will be going home right after the procedure, plan to have a responsible adult: Take you home from the hospital or clinic. You will not be allowed to drive. Care for you for the time you are told. What happens during the procedure?  An IV will be inserted into one of your veins. You will be given a medicine to make you fall asleep (general anesthetic). You will lie on your side with your knees bent. A lubricant will be put on the tube. Then the tube will be: Inserted into your anus. Gently eased through all parts of your large intestine. Air will be sent into your colon to keep it open. This may cause some pressure or cramping. Images will be taken with the camera and will appear on a screen. A small tissue sample may be removed to be looked at under a microscope (biopsy). The tissue may be sent to a lab for testing if any signs of problems are found. If small polyps are found, they may be removed and checked for cancer cells. When the procedure is finished, the tube will be removed. The procedure may vary among health care providers and hospitals. What happens after the procedure? Your blood pressure, heart rate, breathing rate, and blood oxygen level will be monitored until you leave the hospital or clinic. You may have a small amount of blood in your stool. You may pass gas and have mild cramping or bloating in your abdomen. This is caused by the air that was used to open your colon during the exam. If you were given a sedative during the procedure, it can affect you for several hours. Do not drive or operate machinery until your health care provider says that it is safe. It is  up to you to get the results of your procedure. Ask your health care  provider, or the department that is doing the procedure, when your results will be ready. Summary A colonoscopy is a procedure to look at the entire large intestine. Follow instructions from your health care provider about eating and drinking before the procedure. If you were prescribed an oral bowel prep to clean out your colon, take it as told by your health care provider. During the colonoscopy, a flexible tube with a camera on its end is inserted into the anus and then passed into all parts of the large intestine. This information is not intended to replace advice given to you by your health care provider. Make sure you discuss any questions you have with your health care provider. Document Revised: 09/27/2021 Document Reviewed: 05/26/2021 Elsevier Patient Education  [place]. Colonoscopy, Adult, Care After The following information offers guidance on how to care for yourself after your procedure. Your health care provider may also give you more specific instructions. If you have problems or questions, contact your health care provider. What can I expect after the procedure? After the procedure, it is common to have: A small amount of blood in your stool for 24 hours after the procedure. Some gas. Mild cramping or bloating of your abdomen. Follow these instructions at home: Eating and drinking  Drink enough fluid to keep your urine pale yellow. Follow instructions from your health care provider about eating or drinking restrictions. Resume your normal diet as told by your health care provider. Avoid heavy or fried foods that are hard to digest. Activity Rest as told by your health care provider. Avoid sitting for a long time without moving. Get up to take short walks every 1-2 hours. This is important to improve blood flow and breathing. Ask for help if you feel weak or unsteady. Return to your normal  activities as told by your health care provider. Ask your health care provider what activities are safe for you. Managing cramping and bloating  Try walking around when you have cramps or feel bloated. If directed, apply heat to your abdomen as told by your health care provider. Use the heat source that your health care provider recommends, such as a moist heat pack or a heating pad. Place a towel between your skin and the heat source. Leave the heat on for 20-30 minutes. Remove the heat if your skin turns bright red. This is especially important if you are unable to feel pain, heat, or cold. You have a greater risk of getting burned. General instructions If you were given a sedative during the procedure, it can affect you for several hours. Do not drive or operate machinery until your health care provider says that it is safe. For the first 24 hours after the procedure: Do not sign important documents. Do not drink alcohol. Do your regular daily activities at a slower pace than normal. Eat soft foods that are easy to digest. Take over-the-counter and prescription medicines only as told by your health care provider. Keep all follow-up visits. This is important. Contact a health care provider if: You have blood in your stool 2-3 days after the procedure. Get help right away if: You have more than a small spotting of blood in your stool. You have large blood clots in your stool. You have swelling of your abdomen. You have nausea or vomiting. You have a fever. You have increasing pain in your abdomen that is not relieved with medicine. These symptoms may be an emergency. Get help right away. Call 911. Do  not wait to see if the symptoms will go away. Do not drive yourself to the hospital. Summary After the procedure, it is common to have a small amount of blood in your stool. You may also have mild cramping and bloating of your abdomen. If you were given a sedative during the procedure, it  can affect you for several hours. Do not drive or operate machinery until your health care provider says that it is safe. Get help right away if you have a lot of blood in your stool, nausea or vomiting, a fever, or increased pain in your abdomen. This information is not intended to replace advice given to you by your health care provider. Make sure you discuss any questions you have with your health care provider. Document Revised: 05/26/2021 Document Reviewed: 05/26/2021 Elsevier Patient Education  [place].

## 2022-03-21 ENCOUNTER — Encounter (HOSPITAL_COMMUNITY): Payer: Self-pay

## 2022-03-21 ENCOUNTER — Encounter (HOSPITAL_COMMUNITY)
Admission: RE | Admit: 2022-03-21 | Discharge: 2022-03-21 | Disposition: A | Discharge status: Home / Self Care | Payer: Medicaid Other | Source: Ambulatory Visit | Attending: Internal Medicine | Admitting: Internal Medicine

## 2022-03-21 ENCOUNTER — Other Ambulatory Visit: Payer: Self-pay

## 2022-03-21 VITALS — BP 128/72 | HR 98 | Temp 97.5°F | Resp 18 | Ht 61.0 in | Wt 240.4 lb

## 2022-03-21 DIAGNOSIS — K625 Hemorrhage of anus and rectum: Secondary | ICD-10-CM | POA: Insufficient documentation

## 2022-03-21 DIAGNOSIS — Z01810 Encounter for preprocedural cardiovascular examination: Secondary | ICD-10-CM | POA: Diagnosis present

## 2022-03-23 ENCOUNTER — Ambulatory Visit (HOSPITAL_COMMUNITY): Payer: Medicaid Other | Admitting: Physical Therapy

## 2022-03-24 ENCOUNTER — Ambulatory Visit (HOSPITAL_COMMUNITY)
Admission: RE | Admit: 2022-03-24 | Discharge: 2022-03-24 | Disposition: A | Discharge status: Home / Self Care | Payer: Medicaid Other | Attending: Internal Medicine | Admitting: Internal Medicine

## 2022-03-24 ENCOUNTER — Encounter (HOSPITAL_COMMUNITY): Payer: Self-pay

## 2022-03-24 ENCOUNTER — Ambulatory Visit (HOSPITAL_BASED_OUTPATIENT_CLINIC_OR_DEPARTMENT_OTHER): Payer: Medicaid Other | Admitting: Certified Registered Nurse Anesthetist

## 2022-03-24 ENCOUNTER — Other Ambulatory Visit: Payer: Self-pay

## 2022-03-24 ENCOUNTER — Ambulatory Visit (HOSPITAL_COMMUNITY): Payer: Medicaid Other | Admitting: Certified Registered Nurse Anesthetist

## 2022-03-24 ENCOUNTER — Encounter (HOSPITAL_COMMUNITY)
Admission: RE | Disposition: A | Discharge status: Home / Self Care | Payer: Self-pay | Source: Home / Self Care | Attending: Internal Medicine

## 2022-03-24 DIAGNOSIS — K317 Polyp of stomach and duodenum: Secondary | ICD-10-CM

## 2022-03-24 DIAGNOSIS — K219 Gastro-esophageal reflux disease without esophagitis: Secondary | ICD-10-CM | POA: Insufficient documentation

## 2022-03-24 DIAGNOSIS — I10 Essential (primary) hypertension: Secondary | ICD-10-CM | POA: Diagnosis not present

## 2022-03-24 DIAGNOSIS — E119 Type 2 diabetes mellitus without complications: Secondary | ICD-10-CM | POA: Insufficient documentation

## 2022-03-24 DIAGNOSIS — K589 Irritable bowel syndrome without diarrhea: Secondary | ICD-10-CM | POA: Insufficient documentation

## 2022-03-24 DIAGNOSIS — R1310 Dysphagia, unspecified: Secondary | ICD-10-CM | POA: Diagnosis not present

## 2022-03-24 DIAGNOSIS — Z6841 Body Mass Index (BMI) 40.0 and over, adult: Secondary | ICD-10-CM | POA: Insufficient documentation

## 2022-03-24 DIAGNOSIS — K648 Other hemorrhoids: Secondary | ICD-10-CM | POA: Insufficient documentation

## 2022-03-24 DIAGNOSIS — Z79899 Other long term (current) drug therapy: Secondary | ICD-10-CM | POA: Diagnosis not present

## 2022-03-24 DIAGNOSIS — K222 Esophageal obstruction: Secondary | ICD-10-CM | POA: Diagnosis not present

## 2022-03-24 DIAGNOSIS — K625 Hemorrhage of anus and rectum: Secondary | ICD-10-CM | POA: Diagnosis not present

## 2022-03-24 HISTORY — PX: ESOPHAGOGASTRODUODENOSCOPY (EGD) WITH PROPOFOL: SHX5813

## 2022-03-24 HISTORY — PX: COLONOSCOPY WITH PROPOFOL: SHX5780

## 2022-03-24 HISTORY — PX: BALLOON DILATION: SHX5330

## 2022-03-24 HISTORY — PX: POLYPECTOMY: SHX5525

## 2022-03-24 LAB — GLUCOSE, CAPILLARY: Glucose-Capillary: 127 mg/dL — ABNORMAL HIGH (ref 70–99)

## 2022-03-24 SURGERY — COLONOSCOPY WITH PROPOFOL
Anesthesia: General

## 2022-03-24 MED ORDER — MIDAZOLAM HCL 2 MG/2ML IJ SOLN
INTRAMUSCULAR | Status: DC
Start: 2022-03-24 — End: 2022-03-24
  Filled 2022-03-24: qty 2

## 2022-03-24 MED ORDER — PROPOFOL 500 MG/50ML IV EMUL
INTRAVENOUS | Status: AC
  Filled 2022-03-24: qty 50

## 2022-03-24 MED ORDER — LACTATED RINGERS IV SOLN
INTRAVENOUS | Status: DC
Start: 2022-03-24 — End: 2022-03-24

## 2022-03-24 MED ORDER — MIDAZOLAM HCL 2 MG/2ML IJ SOLN
2.0000 mg | Freq: Once | INTRAMUSCULAR | Status: AC
  Administered 2022-03-24: 2 mg via INTRAVENOUS

## 2022-03-24 MED ORDER — PROPOFOL 500 MG/50ML IV EMUL
INTRAVENOUS | Status: DC | PRN
  Administered 2022-03-24: 200 ug/kg/min via INTRAVENOUS

## 2022-03-24 MED ORDER — PROPOFOL 10 MG/ML IV BOLUS
INTRAVENOUS | Status: DC | PRN
  Administered 2022-03-24: 100 mg via INTRAVENOUS

## 2022-03-24 MED ORDER — LACTATED RINGERS IV SOLN: INTRAVENOUS | Status: DC | PRN

## 2022-03-24 MED ORDER — PROPOFOL 1000 MG/100ML IV EMUL
INTRAVENOUS | Status: AC
  Filled 2022-03-24: qty 100

## 2022-03-24 MED ORDER — DEXMEDETOMIDINE (PRECEDEX) IN NS 20 MCG/5ML (4 MCG/ML) IV SYRINGE
PREFILLED_SYRINGE | INTRAVENOUS | Status: DC | PRN
  Administered 2022-03-24: 12 ug via INTRAVENOUS

## 2022-03-24 MED ORDER — DEXMEDETOMIDINE HCL IN NACL 80 MCG/20ML IV SOLN
INTRAVENOUS | Status: AC
  Filled 2022-03-24: qty 40

## 2022-03-24 NOTE — Transfer of Care (Signed)
Immediate Anesthesia Transfer of Care Note  Patient: Cindy Stevenson  Procedure(s) Performed: COLONOSCOPY WITH PROPOFOL ESOPHAGOGASTRODUODENOSCOPY (EGD) WITH PROPOFOL BALLOON DILATION POLYPECTOMY  Patient Location: PACU  Anesthesia Type:General  Level of Consciousness: awake, alert  and oriented  Airway & Oxygen Therapy: Patient Spontanous Breathing  Post-op Assessment: Report given to RN, Post -op Vital signs reviewed and stable, Patient moving all extremities X 4 and Patient able to stick tongue midline  Post vital signs: Reviewed  Last Vitals:  Vitals Value Taken Time  BP 115/59 03/24/22 0948  Temp 36.5 C 03/24/22 0948  Pulse 107 03/24/22 0948  Resp 21 03/24/22 0948  SpO2 96 % 03/24/22 0948    Last Pain:  Vitals:   03/24/22 0948  PainSc: 0-No pain         Complications: No notable events documented.

## 2022-03-24 NOTE — Anesthesia Preprocedure Evaluation (Signed)
Anesthesia Evaluation  Patient identified by MRN, date of birth, ID band Patient awake    Reviewed: Allergy & Precautions, H&P , NPO status , Patient's Chart, lab work & pertinent test results, reviewed documented beta blocker date and time   Airway Mallampati: II  TM Distance: >3 FB Neck ROM: full    Dental no notable dental hx.    Pulmonary neg pulmonary ROS,    Pulmonary exam normal breath sounds clear to auscultation       Cardiovascular Exercise Tolerance: Good hypertension, negative cardio ROS   Rhythm:regular Rate:Normal     Neuro/Psych  Headaches, PSYCHIATRIC DISORDERS Anxiety Depression    GI/Hepatic Neg liver ROS, GERD  Medicated,  Endo/Other  diabetes, Type 2Morbid obesity  Renal/GU negative Renal ROS  negative genitourinary   Musculoskeletal   Abdominal   Peds  Hematology negative hematology ROS (+)   Anesthesia Other Findings   Reproductive/Obstetrics negative OB ROS                             Anesthesia Physical Anesthesia Plan  ASA: 3  Anesthesia Plan: General   Post-op Pain Management:    Induction:   PONV Risk Score and Plan: Propofol infusion  Airway Management Planned:   Additional Equipment:   Intra-op Plan:   Post-operative Plan:   Informed Consent: I have reviewed the patients History and Physical, chart, labs and discussed the procedure including the risks, benefits and alternatives for the proposed anesthesia with the patient or authorized representative who has indicated his/her understanding and acceptance.     Dental Advisory Given  Plan Discussed with: CRNA  Anesthesia Plan Comments:         Anesthesia Quick Evaluation

## 2022-03-24 NOTE — Op Note (Signed)
[facility] Patient Name: Cindy Stevenson Procedure Date: 03/24/2022 9:34 AM MRN: [id number] Date of Birth: Mar 16, 1976 Attending MD: Elon Alas. Abbey Chatters DO CSN: [id number] Age: 46 Admit Type: Outpatient Procedure:                Colonoscopy Indications:              Rectal bleeding Providers:                Elon Alas. Abbey Chatters, DO, Charlsie Quest. Insurance claims handler, Therapist, sports,                            Suzan Garibaldi. Risa Grill, Technician Referring MD:              Medicines:                See the Anesthesia note for documentation of the                            administered medications Complications:            No immediate complications. Estimated Blood Loss:     Estimated blood loss: none. Procedure:                Pre-Anesthesia Assessment:                           - The anesthesia plan was to use monitored                            anesthesia care (MAC).                           After obtaining informed consent, the colonoscope                            was passed under direct vision. Throughout the                            procedure, the patient's blood pressure, pulse, and                            oxygen saturations were monitored continuously. The                            PCF-HQ190L ([id number]) scope was introduced through                            the anus and advanced to the the terminal ileum,                            with identification of the appendiceal orifice and                            IC valve. The colonoscopy was performed without                            difficulty. The patient tolerated the  procedure                            well. The quality of the bowel preparation was                            evaluated using the BBPS [facility] Bowel Preparation                            Scale) with scores of: Right Colon = 3, Transverse                            Colon = 3 and Left Colon = 3 (entire mucosa seen                            well with no residual staining, small  fragments of                            stool or opaque liquid). The total BBPS score                            equals 9. Scope In: 9:35:51 AM Scope Out: 9:44:08 AM Scope Withdrawal Time: 0 hours 6 minutes 6 seconds  Total Procedure Duration: 0 hours 8 minutes 17 seconds  Findings:      Hemorrhoids were found on perianal exam.      Internal hemorrhoids were found during endoscopy.      The terminal ileum appeared normal.      The exam was otherwise without abnormality. Impression:               - Hemorrhoids found on perianal exam.                           - Internal hemorrhoids.                           - The examined portion of the ileum was normal.                           - The examination was otherwise normal.                           - No specimens collected. Moderate Sedation:      Per Anesthesia Care Recommendation:           - Patient has a contact number available for                            emergencies. The signs and symptoms of potential                            delayed complications were discussed with the                            patient. Return to normal activities tomorrow.  Written discharge instructions were provided to the                            patient.                           - Resume previous diet.                           - Continue present medications.                           - Repeat colonoscopy in 10 years for screening                            purposes.                           - Return to GI clinic in 4 months. Procedure Code(s):        --- Professional ---                           [phone], Colonoscopy, flexible; diagnostic, including                            collection of specimen(s) by brushing or washing,                            when performed (separate procedure) Diagnosis Code(s):        --- Professional ---                           K64.8, Other hemorrhoids                           K62.5,  Hemorrhage of anus and rectum CPT copyright 2019 American Medical Association. All rights reserved. The codes documented in this report are preliminary and upon coder review may  be revised to meet current compliance requirements. Elon Alas. Abbey Chatters, DO [place] Abbey Chatters, DO 03/24/2022 9:46:23 AM This report has been signed electronically. Number of Addenda: 0

## 2022-03-24 NOTE — Op Note (Signed)
[facility] Patient Name: Cindy Stevenson Procedure Date: 03/24/2022 9:13 AM MRN: [id number] Date of Birth: Apr 20, 1976 Attending MD: Elon Alas. Abbey Chatters DO CSN: [id number] Age: 46 Admit Type: Outpatient Procedure:                Upper GI endoscopy Indications:              Dysphagia, Heartburn Providers:                Elon Alas. Abbey Chatters, DO, Charlsie Quest. Insurance claims handler, Therapist, sports,                            Suzan Garibaldi. Risa Grill, Technician Referring MD:              Medicines:                See the Anesthesia note for documentation of the                            administered medications Complications:            No immediate complications. Estimated Blood Loss:     Estimated blood loss was minimal. Procedure:                Pre-Anesthesia Assessment:                           - The anesthesia plan was to use monitored                            anesthesia care (MAC).                           After obtaining informed consent, the endoscope was                            passed under direct vision. Throughout the                            procedure, the patient's blood pressure, pulse, and                            oxygen saturations were monitored continuously. The                            GIF-H190 ([id number]) scope was introduced through the                            mouth, and advanced to the second part of duodenum.                            The upper GI endoscopy was accomplished without                            difficulty. The patient tolerated the procedure                            well. Scope  In: 9:22:18 AM Scope Out: 9:31:23 AM Total Procedure Duration: 0 hours 9 minutes 5 seconds  Findings:      The Z-line was regular.      A mild Schatzki ring was found in the distal esophagus. A TTS dilator       was passed through the scope. Dilation with an 18-19-20 mm balloon       dilator was performed to 20 mm. The dilation site was examined and       showed moderate improvement  in luminal narrowing.      Two 8 to 10 mm pedunculated and sessile polyps with no bleeding and no       stigmata of recent bleeding were found in the gastric antrum. The polyp       was removed with a hot snare. Resection and retrieval were complete.      The duodenal bulb, first portion of the duodenum and second portion of       the duodenum were normal. Impression:               - Z-line regular.                           - Mild Schatzki ring. Dilated.                           - Two gastric polyps. Resected and retrieved.                           - Normal duodenal bulb, first portion of the                            duodenum and second portion of the duodenum. Moderate Sedation:      Per Anesthesia Care Recommendation:           - Patient has a contact number available for                            emergencies. The signs and symptoms of potential                            delayed complications were discussed with the                            patient. Return to normal activities tomorrow.                            Written discharge instructions were provided to the                            patient.                           - Resume previous diet.                           - Continue present medications.                           -  Await pathology results.                           - Repeat upper endoscopy PRN for retreatment.                           - Return to GI clinic in 4 months. Procedure Code(s):        --- Professional ---                           [phone], Esophagogastroduodenoscopy, flexible,                            transoral; with removal of tumor(s), polyp(s), or                            other lesion(s) by snare technique                           [id number], Esophagogastroduodenoscopy, flexible,                            transoral; with transendoscopic balloon dilation of                            esophagus (less than 30 mm diameter) Diagnosis Code(s):         --- Professional ---                           K22.2, Esophageal obstruction                           K31.7, Polyp of stomach and duodenum                           R13.10, Dysphagia, unspecified                           R12, Heartburn CPT copyright 2019 American Medical Association. All rights reserved. The codes documented in this report are preliminary and upon coder review may  be revised to meet current compliance requirements. Elon Alas. Abbey Chatters, DO [place] Abbey Chatters, DO 03/24/2022 9:33:43 AM This report has been signed electronically. Number of Addenda: 0

## 2022-03-24 NOTE — Interval H&P Note (Signed)
History and Physical Interval Note:  03/24/2022 9:06 AM  Cindy Stevenson  has presented today for surgery, with the diagnosis of rb, DYSPHAGIA, hemorrhoids, gerd.  The various methods of treatment have been discussed with the patient and family. After consideration of risks, benefits and other options for treatment, the patient has consented to  Procedure(s) with comments: COLONOSCOPY WITH PROPOFOL (N/A) - 9:30am ESOPHAGOGASTRODUODENOSCOPY (EGD) WITH PROPOFOL (N/A) BALLOON DILATION (N/A) as a surgical intervention.  The patient's history has been reviewed, patient examined, no change in status, stable for surgery.  I have reviewed the patient's chart and labs.  Questions were answered to the patient's satisfaction.     Eloise Harman

## 2022-03-24 NOTE — Anesthesia Postprocedure Evaluation (Signed)
Anesthesia Post Note  Patient: CAPRICE MCCAFFREY  Procedure(s) Performed: COLONOSCOPY WITH PROPOFOL ESOPHAGOGASTRODUODENOSCOPY (EGD) WITH PROPOFOL BALLOON DILATION POLYPECTOMY  Patient location during evaluation: Phase II Anesthesia Type: General Level of consciousness: awake Pain management: pain level controlled Vital Signs Assessment: post-procedure vital signs reviewed and stable Respiratory status: spontaneous breathing and respiratory function stable Cardiovascular status: blood pressure returned to baseline and stable Postop Assessment: no headache and no apparent nausea or vomiting Anesthetic complications: no Comments: Late entry   No notable events documented.   Last Vitals:  Vitals:   03/24/22 0948  BP: (!) 115/59  Pulse: (!) 107  Resp: (!) 21  Temp: 36.5 C  SpO2: 96%    Last Pain:  Vitals:   03/24/22 0948  PainSc: 0-No pain                 Louann Sjogren

## 2022-03-24 NOTE — Discharge Instructions (Addendum)
EGD Discharge instructions Please read the instructions outlined below and refer to this sheet in the next few weeks. These discharge instructions provide you with general information on caring for yourself after you leave the hospital. Your doctor may also give you specific instructions. While your treatment has been planned according to the most current medical practices available, unavoidable complications occasionally occur. If you have any problems or questions after discharge, please call your doctor. ACTIVITY You may resume your regular activity but move at a slower pace for the next 24 hours.  Take frequent rest periods for the next 24 hours.  Walking will help expel (get rid of) the air and reduce the bloated feeling in your abdomen.  No driving for 24 hours (because of the anesthesia (medicine) used during the test).  You may shower.  Do not sign any important legal documents or operate any machinery for 24 hours (because of the anesthesia used during the test).  NUTRITION Drink plenty of fluids.  You may resume your normal diet.  Begin with a light meal and progress to your normal diet.  Avoid alcoholic beverages for 24 hours or as instructed by your caregiver.  MEDICATIONS You may resume your normal medications unless your caregiver tells you otherwise.  WHAT YOU CAN EXPECT TODAY You may experience abdominal discomfort such as a feeling of fullness or "gas" pains.  FOLLOW-UP Your doctor will discuss the results of your test with you.  SEEK IMMEDIATE MEDICAL ATTENTION IF ANY OF THE FOLLOWING OCCUR: Excessive nausea (feeling sick to your stomach) and/or vomiting.  Severe abdominal pain and distention (swelling).  Trouble swallowing.  Temperature over 101 F (37.8 C).  Rectal bleeding or vomiting of blood.     Colonoscopy Discharge Instructions  Read the instructions outlined below and refer to this sheet in the next few weeks. These discharge instructions provide you with  general information on caring for yourself after you leave the hospital. Your doctor may also give you specific instructions. While your treatment has been planned according to the most current medical practices available, unavoidable complications occasionally occur.   ACTIVITY You may resume your regular activity, but move at a slower pace for the next 24 hours.  Take frequent rest periods for the next 24 hours.  Walking will help get rid of the air and reduce the bloated feeling in your belly (abdomen).  No driving for 24 hours (because of the medicine (anesthesia) used during the test).   Do not sign any important legal documents or operate any machinery for 24 hours (because of the anesthesia used during the test).  NUTRITION Drink plenty of fluids.  You may resume your normal diet as instructed by your doctor.  Begin with a light meal and progress to your normal diet. Heavy or fried foods are harder to digest and may make you feel sick to your stomach (nauseated).  Avoid alcoholic beverages for 24 hours or as instructed.  MEDICATIONS You may resume your normal medications unless your doctor tells you otherwise.  WHAT YOU CAN EXPECT TODAY Some feelings of bloating in the abdomen.  Passage of more gas than usual.  Spotting of blood in your stool or on the toilet paper.  IF YOU HAD POLYPS REMOVED DURING THE COLONOSCOPY: No aspirin products for 7 days or as instructed.  No alcohol for 7 days or as instructed.  Eat a soft diet for the next 24 hours.  FINDING OUT THE RESULTS OF YOUR TEST Not all test results are  available during your visit. If your test results are not back during the visit, make an appointment with your caregiver to find out the results. Do not assume everything is normal if you have not heard from your caregiver or the medical facility. It is important for you to follow up on all of your test results.  SEEK IMMEDIATE MEDICAL ATTENTION IF: You have more than a spotting of  blood in your stool.  Your belly is swollen (abdominal distention).  You are nauseated or vomiting.  You have a temperature over 101.  You have abdominal pain or discomfort that is severe or gets worse throughout the day.   Your EGD revealed a few small polyps in your stomach which I removed today.  Await pathology results, my office will contact you.  You did have a tightening of your esophagus so I stretched this today.  Hopefully this helps with your swallowing.  Continue on Nexium.  Your colonoscopy was relatively unremarkable.  I did not find any polyps or evidence of colon cancer.  I recommend repeating colonoscopy in 10 years for colon cancer screening purposes.  You do have external and internal hemorrhoids. I would recommend increasing fiber in your diet or adding OTC Benefiber/Metamucil. Be sure to drink at least 4 to 6 glasses of water daily.   Follow-up with GI in 4 months.    I hope you have a great rest of your week!  Cindy Stevenson. Abbey Chatters, D.O. Gastroenterology and Hepatology [facility]

## 2022-03-25 LAB — SURGICAL PATHOLOGY

## 2022-03-26 ENCOUNTER — Other Ambulatory Visit: Payer: Self-pay | Admitting: Gastroenterology

## 2022-03-26 DIAGNOSIS — K649 Unspecified hemorrhoids: Secondary | ICD-10-CM

## 2022-03-26 DIAGNOSIS — K625 Hemorrhage of anus and rectum: Secondary | ICD-10-CM

## 2022-03-28 ENCOUNTER — Encounter: Payer: Self-pay | Admitting: Psychiatry

## 2022-03-28 NOTE — Telephone Encounter (Signed)
Noted  

## 2022-03-30 ENCOUNTER — Ambulatory Visit: Payer: Medicaid Other | Admitting: Psychiatry

## 2022-03-30 VITALS — BP 176/109 | HR 95

## 2022-03-30 DIAGNOSIS — M542 Cervicalgia: Secondary | ICD-10-CM

## 2022-03-30 DIAGNOSIS — M5481 Occipital neuralgia: Secondary | ICD-10-CM

## 2022-03-30 MED ORDER — NARATRIPTAN HCL 2.5 MG PO TABS
2.5000 mg | ORAL_TABLET | ORAL | 11 refills | Status: DC | PRN
Start: 2022-03-30 — End: 2022-06-13

## 2022-03-30 NOTE — Progress Notes (Signed)
Procedure: Occipital Nerve injection/Trigger point injection  Location: bilateral occiput  The risks, benefits and anticipated outcomes of the procedure, the risks and benefits of the alternatives to the procedure, and the roles and tasks of the personnel to be involved, were discussed with the patient, and the patient consents to the procedure and agrees to proceed.     5 cc of 0.25% bupivacaine were prepared in 2 syringes (5 cc).  2 trigger points on the splenius capitus were identified and injected. The bilateral greater occipital nerves were injected 3cm caudal and 1.5 cm lateral to the inion where the main trunk of the occipital nerve penetrates the semispinalis muscle.  The needle was placed perpendicular and the needle advanced 1.5 cm. After aspiration to ensure no obstruction or presence of blood, the area was injected.  The needle was repositioned in a fan-like manner and the entire area was injected. Pressure was held and no hematoma was noted. Pain relief was noted after 5 minutes.  Genia Harold, MD 03/30/22 11:46 AM

## 2022-03-31 ENCOUNTER — Encounter (HOSPITAL_COMMUNITY): Payer: Self-pay | Admitting: Internal Medicine

## 2022-04-06 ENCOUNTER — Ambulatory Visit: Payer: Medicaid Other

## 2022-04-08 ENCOUNTER — Ambulatory Visit: Payer: Medicaid Other | Admitting: Psychiatry

## 2022-04-14 ENCOUNTER — Ambulatory Visit (HOSPITAL_COMMUNITY): Payer: Medicaid Other | Admitting: Physical Therapy

## 2022-04-22 ENCOUNTER — Encounter (HOSPITAL_COMMUNITY): Payer: Self-pay

## 2022-04-22 ENCOUNTER — Other Ambulatory Visit: Payer: Self-pay

## 2022-04-22 ENCOUNTER — Emergency Department (HOSPITAL_COMMUNITY): Payer: Medicaid Other

## 2022-04-22 ENCOUNTER — Emergency Department (HOSPITAL_COMMUNITY)
Admission: EM | Admit: 2022-04-22 | Discharge: 2022-04-23 | Disposition: A | Discharge status: Home / Self Care | Payer: Medicaid Other | Attending: Student | Admitting: Student

## 2022-04-22 DIAGNOSIS — Z7984 Long term (current) use of oral hypoglycemic drugs: Secondary | ICD-10-CM | POA: Insufficient documentation

## 2022-04-22 DIAGNOSIS — R1032 Left lower quadrant pain: Secondary | ICD-10-CM | POA: Insufficient documentation

## 2022-04-22 DIAGNOSIS — Z91040 Latex allergy status: Secondary | ICD-10-CM | POA: Diagnosis not present

## 2022-04-22 DIAGNOSIS — R110 Nausea: Secondary | ICD-10-CM | POA: Insufficient documentation

## 2022-04-22 DIAGNOSIS — I10 Essential (primary) hypertension: Secondary | ICD-10-CM | POA: Diagnosis not present

## 2022-04-22 DIAGNOSIS — N83202 Unspecified ovarian cyst, left side: Secondary | ICD-10-CM

## 2022-04-22 DIAGNOSIS — E119 Type 2 diabetes mellitus without complications: Secondary | ICD-10-CM | POA: Diagnosis not present

## 2022-04-22 DIAGNOSIS — Z79899 Other long term (current) drug therapy: Secondary | ICD-10-CM | POA: Diagnosis not present

## 2022-04-22 LAB — CBC WITH DIFFERENTIAL/PLATELET
Abs Immature Granulocytes: 0.08 10*3/uL — ABNORMAL HIGH (ref 0.00–0.07)
Basophils Absolute: 0.1 10*3/uL (ref 0.0–0.1)
Basophils Relative: 1 %
Eosinophils Absolute: 0.3 10*3/uL (ref 0.0–0.5)
Eosinophils Relative: 2 %
HCT: 39.3 % (ref 36.0–46.0)
Hemoglobin: 13.1 g/dL (ref 12.0–15.0)
Immature Granulocytes: 1 %
Lymphocytes Relative: 33 %
Lymphs Abs: 4.5 10*3/uL — ABNORMAL HIGH (ref 0.7–4.0)
MCH: 30 pg (ref 26.0–34.0)
MCHC: 33.3 g/dL (ref 30.0–36.0)
MCV: 90.1 fL (ref 80.0–100.0)
Monocytes Absolute: 0.9 10*3/uL (ref 0.1–1.0)
Monocytes Relative: 7 %
Neutro Abs: 8 10*3/uL — ABNORMAL HIGH (ref 1.7–7.7)
Neutrophils Relative %: 56 %
Platelets: 518 10*3/uL — ABNORMAL HIGH (ref 150–400)
RBC: 4.36 MIL/uL (ref 3.87–5.11)
RDW: 12.4 % (ref 11.5–15.5)
WBC: 13.8 10*3/uL — ABNORMAL HIGH (ref 4.0–10.5)
nRBC: 0 % (ref 0.0–0.2)

## 2022-04-22 LAB — BASIC METABOLIC PANEL
Anion gap: 12 (ref 5–15)
BUN: 11 mg/dL (ref 6–20)
CO2: 22 mmol/L (ref 22–32)
Calcium: 8.8 mg/dL — ABNORMAL LOW (ref 8.9–10.3)
Chloride: 103 mmol/L (ref 98–111)
Creatinine, Ser: 0.83 mg/dL (ref 0.44–1.00)
GFR, Estimated: >60 mL/min (ref >60)
Glucose, Bld: 200 mg/dL — ABNORMAL HIGH (ref 70–99)
Potassium: 3.3 mmol/L — ABNORMAL LOW (ref 3.5–5.1)
Sodium: 137 mmol/L (ref 135–145)

## 2022-04-22 LAB — URINALYSIS, ROUTINE W REFLEX MICROSCOPIC
Bilirubin Urine: NEGATIVE
Glucose, UA: 150 mg/dL — AB
Hgb urine dipstick: NEGATIVE
Ketones, ur: NEGATIVE mg/dL
Leukocytes,Ua: NEGATIVE
Nitrite: NEGATIVE
Protein, ur: NEGATIVE mg/dL
Specific Gravity, Urine: 1.019 (ref 1.005–1.030)
pH: 5 (ref 5.0–8.0)

## 2022-04-22 MED ORDER — MORPHINE SULFATE (PF) 4 MG/ML IV SOLN
4.0000 mg | Freq: Once | INTRAVENOUS | Status: AC
  Administered 2022-04-22: 4 mg via INTRAVENOUS
  Filled 2022-04-22: qty 1

## 2022-04-22 MED ORDER — ONDANSETRON HCL 4 MG/2ML IJ SOLN
4.0000 mg | Freq: Once | INTRAMUSCULAR | Status: AC
  Administered 2022-04-22: 4 mg via INTRAVENOUS
  Filled 2022-04-22: qty 2

## 2022-04-22 MED ORDER — IOHEXOL 300 MG/ML  SOLN
100.0000 mL | Freq: Once | INTRAMUSCULAR | Status: AC | PRN
  Administered 2022-04-22: 100 mL via INTRAVENOUS

## 2022-04-22 MED ORDER — LACTATED RINGERS IV BOLUS
1000.0000 mL | Freq: Once | INTRAVENOUS | Status: AC
  Administered 2022-04-22: 1000 mL via INTRAVENOUS

## 2022-04-22 MED ORDER — KETOROLAC TROMETHAMINE 15 MG/ML IJ SOLN
15.0000 mg | Freq: Once | INTRAMUSCULAR | Status: AC
  Administered 2022-04-23: 15 mg via INTRAVENOUS
  Filled 2022-04-22: qty 1

## 2022-04-22 MED ORDER — MORPHINE SULFATE (PF) 4 MG/ML IV SOLN
4.0000 mg | Freq: Once | INTRAVENOUS | Status: AC
  Administered 2022-04-23: 4 mg via INTRAVENOUS
  Filled 2022-04-22: qty 1

## 2022-04-22 NOTE — ED Triage Notes (Signed)
Pt arrived via POV c/o abdominal pain X 2 days, dysuria and upper back pain. Pt reports contacting PCP today and was advised to go to ER for evaluation. Pt endorsing nausea w/o emesis.

## 2022-04-22 NOTE — ED Notes (Signed)
Pt ambulated to restroom. 

## 2022-04-22 NOTE — ED Provider Notes (Signed)
[facility] EMERGENCY DEPARTMENT Provider Note  CSN: [id number] Arrival date & time: 04/22/22 1947  Chief Complaint(s) Abdominal Pain  HPI Cindy Stevenson is a 46 y.o. female with PMH dysmenorrhea, T2DM, hysterectomy with bilateral salpingectomy by Dr. Mallory Shirk in 2018 secondary to ovarian adhesions who presents emergency department for evaluation of abdominal pain.  Patient's endorses gradually worsening left lower quadrant abdominal pain with associated nausea but no vomiting.  She was seen by her primary care physician today who sent her to the emergency department for further work-up given negative urinalysis.  She denies chest pain, shortness of breath, headache, fever or other systemic symptoms.     Past Medical History Past Medical History:  Diagnosis Date   Anxiety    Breast nodule 08/06/2015   Breast pain, left 08/06/2015   Burning with urination 02/27/2014   Common migraine with intractable migraine 06/11/2018   Depression    Diabetes mellitus without complication ([place])    Dysmenorrhea 02/27/2014   Fibroadenoma of right breast 08/26/2015   GERD (gastroesophageal reflux disease)    Headache    Hematuria 02/27/2014   Hypertension    PTSD (post-traumatic stress disorder)    Screening for STD (sexually transmitted disease) 03/27/2014   Seasonal allergies    SUI (stress urinary incontinence, female) 02/27/2014   Vaginal discharge 08/06/2015   Warts, genital 01/23/2015   Yeast infection 08/06/2015   Patient Active Problem List   Diagnosis Date Noted   Hemorrhoids 03/11/2022   Vaginal irritation 12/09/2020   Screening examination for STD (sexually transmitted disease) 12/09/2020   Anxiety and depression 12/09/2020   Mixed stress and urge urinary incontinence 12/09/2020   Low back pain 08/24/2020   Routine medical exam 05/14/2020   Encounter for screening fecal occult blood testing 05/14/2020   Current use of estrogen therapy 05/14/2020   Hot flashes 04/30/2020   [place]  04/30/2020   Common migraine with intractable migraine 06/11/2018   Status post abdominal supracervical subtotal hysterectomy 05/09/2017   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   Abnormal uterine bleeding (AUB) 03/09/2017   Depression 03/09/2017   Pelvic pain 03/09/2017   Irritable bowel syndrome 05/09/2016   GERD (gastroesophageal reflux disease) 05/09/2016   Fibroadenoma of right breast 08/26/2015   Vaginal discharge 08/06/2015   Yeast infection 08/06/2015   Breast nodule 08/06/2015   Breast pain, left 08/06/2015   Postop check 03/25/2015   Warts, genital 01/23/2015   Screening for STD (sexually transmitted disease) 03/27/2014   Burning with urination 02/27/2014   Hematuria 02/27/2014   SUI (stress urinary incontinence, female) 02/27/2014   Dysmenorrhea 02/27/2014   Abdominal pain, generalized 09/06/2013   Rectal bleeding 09/06/2013   Home Medication(s) Prior to Admission medications   Medication Sig Start Date End Date Taking? Authorizing Provider  ACCU-CHEK GUIDE test strip  04/23/21   [provider]  Accu-Chek Softclix Lancets lancets SMARTSIG:Topical 04/22/21   [provider]  acetaminophen (TYLENOL) 500 MG tablet Take 1,000 mg by mouth every 6 (six) hours as needed for moderate pain.    [provider]  albuterol (VENTOLIN HFA) 108 (90 Base) MCG/ACT inhaler Inhale 2 puffs into the lungs every 6 (six) hours as needed for shortness of breath or wheezing. 12/10/20   [provider]  ALPRAZolam Duanne Moron) 1 MG tablet Take 1 mg by mouth 3 (three) times daily.    [provider]  cetirizine (ZYRTEC) 10 MG tablet Take 10 mg by mouth daily. 12/06/20   [provider]  Cholecalciferol (VITAMIN  D) 50 MCG (2000 UT) tablet Take 2,000 Units by mouth daily.    [provider]  esomeprazole (NEXIUM) 40 MG capsule Take 40 mg by mouth 2 (two) times daily.     [provider]  gabapentin (NEURONTIN) 800 MG tablet Take 400  mg by mouth 3 (three) times daily. 12/03/20   [provider]  Galcanezumab-gnlm (EMGALITY) 120 MG/ML SOAJ Inject 120 mg into the skin every 30 (thirty) days.    [provider]  hydrochlorothiazide (HYDRODIURIL) 12.5 MG tablet Take 12.5 mg by mouth daily. 03/09/21   [provider]  ibuprofen (ADVIL) 600 MG tablet Take 600 mg by mouth 3 (three) times daily. 05/10/21   [provider]  ketoconazole (NIZORAL) 2 % shampoo Apply 1 application. topically daily as needed (scalp irritation). 07/30/20   [provider]  Multiple Vitamins-Minerals (MULTIPLE VITAMINS/WOMENS PO) Take 1 tablet by mouth daily.    [provider]  naratriptan (AMERGE) 2.5 MG tablet Take 1 tablet (2.5 mg total) by mouth as needed for migraine. Take one (1) tablet at onset of headache; if returns or does not resolve, may repeat after 4 hours; do not exceed five (5) mg in 24 hours. 03/30/22   Genia Harold, MD  nystatin ointment (MYCOSTATIN) Apply 1 application. topically daily as needed (irritation under breasts).    [provider]  ondansetron (ZOFRAN) 8 MG tablet Take 8 mg by mouth every 8 (eight) hours as needed for nausea. 02/14/22   [provider]  Polyethyl Glycol-Propyl Glycol (SYSTANE OP) Place 1 drop into both eyes daily as needed (dry eyes).    [provider]  PROCTO-MED HC 2.5 % rectal cream APPLY RECTALLY TWICE DAILY 03/28/22   Annitta Needs, NP  Semaglutide (RYBELSUS) 3 MG TABS Take 3 mg by mouth daily.    [provider]  SYMBICORT 160-4.5 MCG/ACT inhaler Inhale 2 puffs into the lungs 2 (two) times daily. 02/27/21   [provider]  vitamin B-12 (CYANOCOBALAMIN) 500 MCG tablet Take 500 mcg by mouth daily.    [provider]                                                                                                                                    Past Surgical History Past Surgical History:  Procedure  Laterality Date   BALLOON DILATION N/A 03/24/2022   Procedure: BALLOON DILATION;  Surgeon: Eloise Harman, DO;  Location: [facility];  Service: Endoscopy;  Laterality: N/A;   BILATERAL SALPINGECTOMY Bilateral 05/09/2017   Procedure: BILATERAL SALPINGECTOMY;  Surgeon: Jonnie Kind, MD;  Location: [facility] ORS;  Service: Gynecology;  Laterality: Bilateral;   COLONOSCOPY N/A 09/10/2013   GEZ:MOQHU;/TMLYY internal hemorrhoids   COLONOSCOPY WITH PROPOFOL N/A 03/24/2022   Procedure: COLONOSCOPY WITH PROPOFOL;  Surgeon: Eloise Harman, DO;  Location: [facility];  Service: Endoscopy;  Laterality: N/A;  9:30am   ESOPHAGOGASTRODUODENOSCOPY  N/A 09/10/2013   JYN:WGNFAOZH ring at the gastroesophagral juctions/mild non-erosive gastritis   ESOPHAGOGASTRODUODENOSCOPY  2017   Dr. Britta Mccreedy; Schatzki's ring in the distal esophagus, otherwise normal exam.   ESOPHAGOGASTRODUODENOSCOPY (EGD) WITH PROPOFOL N/A 03/24/2022   Procedure: ESOPHAGOGASTRODUODENOSCOPY (EGD) WITH PROPOFOL;  Surgeon: Eloise Harman, DO;  Location: [facility];  Service: Endoscopy;  Laterality: N/A;   LABIOPLASTY  02/14/2012   Procedure: LABIAPLASTY;  Surgeon: Jonnie Kind, MD;  Location: [facility];  Service: Gynecology;  Laterality: Right;  of the right labia minora   POLYPECTOMY  03/24/2022   Procedure: POLYPECTOMY;  Surgeon: Eloise Harman, DO;  Location: [facility];  Service: Endoscopy;;   SUPRACERVICAL ABDOMINAL HYSTERECTOMY N/A 05/09/2017   Procedure: HYSTERECTOMY SUPRACERVICAL ABDOMINAL;  Surgeon: Jonnie Kind, MD;  Location: [facility];  Service: Gynecology;  Laterality: N/A;   WISDOM TOOTH EXTRACTION  10/18/1995   Dr. Owens Shark   Family History Family History  Problem Relation Age of Onset   Lung cancer Mother    Asthma Sister    Allergies Sister    Mental illness Maternal Aunt        depression, PTSD   Diabetes Maternal Grandmother    Cancer Maternal Grandfather        skin   Colon cancer Neg Hx     Social  History Social History   Tobacco Use   Smoking status: Never   Smokeless tobacco: Never  Vaping Use   Vaping Use: Never used  Substance Use Topics   Alcohol use: No   Drug use: No   Allergies Effexor [venlafaxine], Latex, Codeine, Shellfish allergy, Amoxicillin, and Ceftin [cefuroxime]  Review of Systems Review of Systems  Gastrointestinal:  Positive for abdominal pain.    Physical Exam Vital Signs  I have reviewed the triage vital signs BP (!) 146/87 (BP Location: Right Arm)   Pulse (!) 128   Temp 98.3 F (36.8 C) (Oral)   Resp 18   Ht '5\' 1"'$  (1.549 m)   Wt 109.3 kg   LMP 04/21/2017 Comment: [place]  SpO2 95%   BMI 45.54 kg/m   Physical Exam Vitals and nursing note reviewed.  Constitutional:      General: She is not in acute distress.    Appearance: She is well-developed. She is ill-appearing.  HENT:     Head: Normocephalic and atraumatic.  Eyes:     Conjunctiva/sclera: Conjunctivae normal.  Cardiovascular:     Rate and Rhythm: Normal rate and regular rhythm.     Heart sounds: No murmur heard. Pulmonary:     Effort: Pulmonary effort is normal. No respiratory distress.     Breath sounds: Normal breath sounds.  Abdominal:     Palpations: Abdomen is soft.     Tenderness: There is abdominal tenderness in the suprapubic area and left lower quadrant.  Musculoskeletal:        General: No swelling.     Cervical back: Neck supple.  Skin:    General: Skin is warm and dry.     Capillary Refill: Capillary refill takes less than 2 seconds.  Neurological:     Mental Status: She is alert.  Psychiatric:        Mood and Affect: Mood normal.     ED Results and Treatments Labs (all labs ordered are listed, but only abnormal results are displayed) Labs Reviewed  URINALYSIS, ROUTINE W REFLEX MICROSCOPIC - Abnormal; Notable for the following components:      Result Value   APPearance  HAZY (*)    Glucose, UA 150 (*)    All other components within normal limits  CBC  WITH DIFFERENTIAL/PLATELET - Abnormal; Notable for the following components:   WBC 13.8 (*)    Platelets 518 (*)    Neutro Abs 8.0 (*)    Lymphs Abs 4.5 (*)    Abs Immature Granulocytes 0.08 (*)    All other components within normal limits  BASIC METABOLIC PANEL - Abnormal; Notable for the following components:   Potassium 3.3 (*)    Glucose, Bld 200 (*)    Calcium 8.8 (*)    All other components within normal limits                                                                                                                          Radiology No results found.  Pertinent labs & imaging results that were available during my care of the patient were reviewed by me and considered in my medical decision making (see MDM for details).  Medications Ordered in ED Medications - No data to display                                                                                                                                   Procedures Procedures  (including critical care time)  Medical Decision Making / ED Course   This patient presents to the ED for concern of abdominal pain, this involves an extensive number of treatment options, and is a complaint that carries with it a high risk of complications and morbidity.  The differential diagnosis includes cystitis, ovarian torsion, diverticulitis, nephrolithiasis  MDM: Patient seen emergency room for evaluation of abdominal pain.  Physical exam reveals tenderness over the suprapubic region in the left lower quadrant but is otherwise unremarkable.  Laboratory evaluation with a leukocytosis to 13.8, hypokalemia to 3.3, urinalysis unremarkable.  CT abdomen pelvis with a 5.7 cm left ovarian cyst but is otherwise unremarkable.  Patient presentation and large ovarian cyst size is concerning for possible torsion and thus a transvaginal ultrasound was ordered.  Patient then signed out to oncoming provider.  Please see provider signout for continuation  of work-up.  Anticipate admission with OB/GYN evaluation even if TVUS negative given clinical concern for ovarian torsion.   Additional history obtained:  -External records from outside source obtained and reviewed including: Chart review including previous notes, labs, imaging, consultation  notes   Lab Tests: -I ordered, reviewed, and interpreted labs.   The pertinent results include:   Labs Reviewed  URINALYSIS, ROUTINE W REFLEX MICROSCOPIC - Abnormal; Notable for the following components:      Result Value   APPearance HAZY (*)    Glucose, UA 150 (*)    All other components within normal limits  CBC WITH DIFFERENTIAL/PLATELET - Abnormal; Notable for the following components:   WBC 13.8 (*)    Platelets 518 (*)    Neutro Abs 8.0 (*)    Lymphs Abs 4.5 (*)    Abs Immature Granulocytes 0.08 (*)    All other components within normal limits  BASIC METABOLIC PANEL - Abnormal; Notable for the following components:   Potassium 3.3 (*)    Glucose, Bld 200 (*)    Calcium 8.8 (*)    All other components within normal limits     Imaging Studies ordered: I ordered imaging studies including CT abdomen pelvis I independently visualized and interpreted imaging. I agree with the radiologist interpretation  TVUS pending   Medicines ordered and prescription drug management: No orders of the defined types were placed in this encounter.   -I have reviewed the patients home medicines and have made adjustments as needed  Critical interventions none   Cardiac Monitoring: The patient was maintained on a cardiac monitor.  I personally viewed and interpreted the cardiac monitored which showed an underlying rhythm of: NSR  Social Determinants of Health:  Factors impacting patients care include: none   Reevaluation: After the interventions noted above, I reevaluated the patient and found that they have :improved  Co morbidities that complicate the patient evaluation  Past Medical  History:  Diagnosis Date   Anxiety    Breast nodule 08/06/2015   Breast pain, left 08/06/2015   Burning with urination 02/27/2014   Common migraine with intractable migraine 06/11/2018   Depression    Diabetes mellitus without complication ([place])    Dysmenorrhea 02/27/2014   Fibroadenoma of right breast 08/26/2015   GERD (gastroesophageal reflux disease)    Headache    Hematuria 02/27/2014   Hypertension    PTSD (post-traumatic stress disorder)    Screening for STD (sexually transmitted disease) 03/27/2014   Seasonal allergies    SUI (stress urinary incontinence, female) 02/27/2014   Vaginal discharge 08/06/2015   Warts, genital 01/23/2015   Yeast infection 08/06/2015      Dispostion: I considered admission for this patient, and disposition is pending TVUS.  Anticipate admission     Final Clinical Impression(s) / ED Diagnoses Final diagnoses:  None     '@PCDICTATION'$ @    Teressa Lower, MD 04/23/22 [phone]

## 2022-04-23 ENCOUNTER — Emergency Department (HOSPITAL_COMMUNITY): Payer: Medicaid Other

## 2022-04-23 MED ORDER — ONDANSETRON HCL 4 MG/2ML IJ SOLN
4.0000 mg | Freq: Once | INTRAMUSCULAR | Status: AC
  Administered 2022-04-23: 4 mg via INTRAVENOUS
  Filled 2022-04-23: qty 2

## 2022-04-23 MED ORDER — HYDROMORPHONE HCL 1 MG/ML IJ SOLN
1.0000 mg | Freq: Once | INTRAMUSCULAR | Status: AC
  Administered 2022-04-23: 1 mg via INTRAVENOUS
  Filled 2022-04-23: qty 1

## 2022-04-23 MED ORDER — OXYCODONE-ACETAMINOPHEN 5-325 MG PO TABS: 1.0000 | ORAL_TABLET | Freq: Four times a day (QID) | ORAL | 0 refills | Status: DC | PRN

## 2022-04-23 MED ORDER — OXYCODONE-ACETAMINOPHEN 5-325 MG PO TABS: 1.0000 | ORAL_TABLET | ORAL | 0 refills | Status: DC | PRN

## 2022-04-23 NOTE — ED Notes (Signed)
Pt back from US

## 2022-04-23 NOTE — ED Notes (Signed)
Pt transported to ultrasound.

## 2022-04-23 NOTE — ED Notes (Addendum)
Xray  called to verify that [place] tech had been called in for [place] to r/o torsion

## 2022-04-23 NOTE — ED Provider Notes (Signed)
Patient signed out to me by Dr. Matilde Sprang.  Patient seen with left-sided abdominal and pelvic pain.  Patient had a CT scan that showed large left-sided ovarian cyst that was simple in nature.  Patient therefore had pelvic ultrasound with duplex performed to rule out ovarian torsion.  Study has been performed and reviewed.  Patient is reported to have a nearly 6 cm ovarian cyst on the left with normal arterial and venous blood flow.  No sign of torsion.  Interestingly, patient is stated to have had left salpingo oophorectomy when she had her hysterectomy in 2018.  There is no op note but surgical procedure reads as hysterectomy with left salpingo-oophorectomy and right salpingectomy.  It is not clear if she has a left ovary based on this.  I did discuss the findings with on-call OB/GYN at [facility] in [place].  It was felt that based on these images and this operative history, there would not be any concern for missed ovarian torsion in this patient.  Recommendation is analgesia and follow-up in the office.  I did go over reasons for return including all signs of hemorrhagic cyst and internal bleeding.   Orpah Greek, MD 04/23/22 [phone]

## 2022-04-25 MED FILL — Oxycodone w/ Acetaminophen Tab 5-325 MG: ORAL | Qty: 6 | Status: AC

## 2022-04-27 ENCOUNTER — Ambulatory Visit (INDEPENDENT_AMBULATORY_CARE_PROVIDER_SITE_OTHER): Payer: Medicaid Other

## 2022-04-27 VITALS — BP 139/91 | HR 82 | Temp 97.9°F | Resp 18 | Ht 61.0 in | Wt 242.0 lb

## 2022-04-27 DIAGNOSIS — G43019 Migraine without aura, intractable, without status migrainosus: Secondary | ICD-10-CM | POA: Diagnosis not present

## 2022-04-27 MED ORDER — SODIUM CHLORIDE 0.9 % IV SOLN
100.0000 mg | Freq: Once | INTRAVENOUS | Status: AC
  Administered 2022-04-27: 100 mg via INTRAVENOUS
  Filled 2022-04-27: qty 1

## 2022-04-27 NOTE — Progress Notes (Signed)
Diagnosis: common migraine with intractable migraine  Provider:  Marshell Garfinkel, MD  Procedure: Infusion  IV Type: Peripheral, IV Location: L Forearm  Vyepti (Eptinezumab-jjmr), Dose: 100 mg  Infusion Start Time: 7998  Infusion Stop Time: 7215  Post Infusion IV Care: Observation period completed and Peripheral IV Discontinued  Discharge: Condition: Good, Destination: Home . AVS provided to patient.   Performed by:  Adelina Mings, LPN

## 2022-06-07 ENCOUNTER — Other Ambulatory Visit: Payer: Self-pay | Admitting: Gastroenterology

## 2022-06-07 DIAGNOSIS — K649 Unspecified hemorrhoids: Secondary | ICD-10-CM

## 2022-06-07 DIAGNOSIS — K625 Hemorrhage of anus and rectum: Secondary | ICD-10-CM

## 2022-06-09 ENCOUNTER — Encounter: Payer: Self-pay | Admitting: Psychiatry

## 2022-06-13 MED ORDER — NARATRIPTAN HCL 2.5 MG PO TABS: 2.5000 mg | ORAL_TABLET | ORAL | 0 refills | Status: DC | PRN

## 2022-06-14 ENCOUNTER — Telehealth: Payer: Self-pay | Admitting: *Deleted

## 2022-06-14 MED ORDER — UBRELVY 100 MG PO TABS
100.0000 mg | ORAL_TABLET | ORAL | 6 refills | Status: DC | PRN
Start: 2022-06-14 — End: 2022-06-23

## 2022-06-14 NOTE — Telephone Encounter (Signed)
I sent in an rx for Ubrelvy to take as needed for migraines. She can alternate this with naratriptan to avoid using naratriptan too frequently. We can discuss preventive medications at our upcoming appt in September

## 2022-06-14 NOTE — Telephone Encounter (Signed)
Patient has sent message asking for 90 day supply of naratriptan. Dr Billey Gosling wrote #30 tabs for 90 days, and patient stated pharmacy will not fill. I Called pharmacy, spoke with Judson Roch who stated she picked up  #12 tabs on 05/28/22. Judson Roch did over ride and  insurance will  only cover 12 tabs every 23 days. It will not allow for a 90 day supply to be filled. Judson Roch stated  between insurance allowing 12 every 23 days and her coming in and paying cash for 12 tabs historically the patient is probably taking one tab daily. She is coming in about every 12 days to refill and pays cash if too soon to refill from insurance.  I advised will let Dr Billey Gosling know.

## 2022-06-14 NOTE — Telephone Encounter (Signed)
This drug has been approved. Approved quantity: 16 tablets per 30 day(s). You may fill up toa 34 day supply at a retail pharmacy.  You may fill up to a 90 day supply for maintenance drugs, please refer to the formulary for details. End Date 06/14/2023 Will advise patient via my chart.

## 2022-06-14 NOTE — Telephone Encounter (Signed)
Roselyn Meier PA, Key: [id number] . Your information has been sent to [facility].

## 2022-06-18 ENCOUNTER — Other Ambulatory Visit: Payer: Self-pay | Admitting: Gastroenterology

## 2022-06-18 DIAGNOSIS — K649 Unspecified hemorrhoids: Secondary | ICD-10-CM

## 2022-06-18 DIAGNOSIS — K625 Hemorrhage of anus and rectum: Secondary | ICD-10-CM

## 2022-06-21 ENCOUNTER — Telehealth: Payer: Self-pay | Admitting: Psychiatry

## 2022-06-21 NOTE — Telephone Encounter (Signed)
Pt is complaining of sharp pain behind left ear and states that ubrelvy medication is not working but states that she is sick and pt needed to reschedule 9/11 appointment with Dr. Billey Gosling, but her next available was 01/04/2023 and pt would like to know if it is possible to be worked in. Pt would like a call back to discuss

## 2022-06-21 NOTE — Telephone Encounter (Signed)
Yes let's see if we can get her in with an NP. Thanks!

## 2022-06-21 NOTE — Telephone Encounter (Signed)
Called pt and pt stated she would like to see Dr. Billey Gosling instead of NP. Pt is still scheduled on  01/04/2023 and on the wait list for Dr. Billey Gosling

## 2022-06-22 NOTE — Telephone Encounter (Signed)
We can have her schedule an appointment for a nerve block in one of my injection slots, but I don't think I would have time to do both the injection and office visit in one slot tomorrow.

## 2022-06-23 ENCOUNTER — Ambulatory Visit: Payer: Medicaid Other | Admitting: Psychiatry

## 2022-06-23 VITALS — BP 166/100 | HR 110 | Ht 61.0 in | Wt 241.4 lb

## 2022-06-23 DIAGNOSIS — G43119 Migraine with aura, intractable, without status migrainosus: Secondary | ICD-10-CM | POA: Diagnosis not present

## 2022-06-23 MED ORDER — DICLOFENAC POTASSIUM 50 MG PO TABS: ORAL_TABLET | ORAL | 6 refills | Status: DC

## 2022-06-23 MED ORDER — BACLOFEN 10 MG PO TABS: ORAL_TABLET | ORAL | 6 refills | Status: DC

## 2022-06-23 MED ORDER — NARATRIPTAN HCL 2.5 MG PO TABS: 2.5000 mg | ORAL_TABLET | ORAL | 0 refills | Status: DC | PRN

## 2022-06-23 MED ORDER — KETOROLAC TROMETHAMINE 60 MG/2ML IM SOLN
60.0000 mg | Freq: Once | INTRAMUSCULAR | Status: AC
  Administered 2022-06-23: 60 mg via INTRAMUSCULAR

## 2022-06-23 NOTE — Addendum Note (Signed)
Addended by: Trinidad Curet on: 06/23/2022 04:13 PM   Modules accepted: Orders

## 2022-06-23 NOTE — Progress Notes (Signed)
   CC:  headaches  Follow-up Visit  Last visit: 01/24/22  Brief HPI: 46 year old female with a history of DM who follows in clinic for chronic migraines.  Interval History: She continued to have frequent headaches on Emgality so she was switched to [facility]. Her first infusion was 04/27/22. She is scheduled for her second infusion in October. She does feel like Vyepti has helped reduce her headaches. Went from 30 per month down to 11 per month. She was prescribed Ubrelvy for rescue to use in addition to naratriptan when needed. However Roselyn Meier was ineffective. She has been taking Tylenol instead.  Was called to schedule neck PT, but states she has been in and out of the ED and has not had time to schedule.  Headache days per month: 11 Headache free days per month: 19  Current Headache Regimen: Preventative: Vyepti 100 mg Abortive: naratriptan, Ubrelvy   Prior Therapies                                  Preventive: Prozac Paxil Cymbalta Amitriptyline Topamax Gabapentin Metoprolol Lisinopril losartan Ajovy Aimovig Emgality Vyepti 100 mg  Botox - worsened neck pain Occipital nerve block Neck PT  Rescue: naratriptan Maxalt Imitrex Nurtec Ubrelvy  Physical Exam:   Vital Signs: BP (!) 166/100   Pulse (!) 110   Ht '5\' 1"'$  (1.549 m)   Wt 241 lb 6 oz (109.5 kg)   LMP 04/21/2017 Comment: [place]  BMI 45.61 kg/m  GENERAL:  well appearing, in no acute distress, alert  SKIN:  Color, texture, turgor normal. No rashes or lesions HEAD:  Normocephalic/atraumatic. RESP: normal respiratory effort MSK:  No gross joint deformities.   NEUROLOGICAL: Mental Status: Alert, oriented to person, place and time, Follows commands, and Speech fluent and appropriate. Cranial Nerves: PERRL, face symmetric, no dysarthria, hearing grossly intact Motor: moves all extremities equally Gait: normal-based.  IMPRESSION: 46 year old female with a history of DM who presents for follow up of chronic  migraines. She has had some improvement with Vyepti 100 mg every 3 months, but continues to have ~11 migraines per month. Will increase dose to 300 mg. Naratriptan is helpful for rescue but she will sometimes run out of the medication. Will prescribe diclofenac to take as needed in addition to naratriptan. Baclofen prescribed for neck muscle spasms. Encouraged her to schedule neck PT when her schedule allows.  PLAN: -Toradol shot given in office today -Prevention: Increase Vyepti to 300 mg every 3 months -Rescue: continue naratriptan 2.5 mg PRN. Start Diclofenac 50-100 mg PRN. -Start baclofen 5-10 mg TID PRN for muscle spasms  Follow-up: 6 months  I spent a total of 30 minutes on the date of the service. Headache education was done. Discussed treatment options including preventive and acute medications, and infusion therapy. Discussed medication overuse headache and to limit use of acute treatments to no more than 2 days/week or 10 days/month. Discussed medication side effects, adverse reactions and drug interactions. Written educational materials and patient instructions outlining all of the above were given.  Genia Harold, MD 06/23/22 2:01 PM

## 2022-06-27 ENCOUNTER — Ambulatory Visit: Payer: Medicaid Other | Admitting: Psychiatry

## 2022-06-28 ENCOUNTER — Telehealth: Payer: Self-pay | Admitting: *Deleted

## 2022-06-28 NOTE — Telephone Encounter (Signed)
Diclofenac PA< Key: [id number]. Your information has been sent to [facility].

## 2022-06-29 ENCOUNTER — Ambulatory Visit: Payer: Medicaid Other | Admitting: Psychiatry

## 2022-06-29 ENCOUNTER — Encounter: Payer: Self-pay | Admitting: *Deleted

## 2022-06-29 NOTE — Telephone Encounter (Signed)
Diclofenac- This drug has been approved. Approved quantity: 15 tablets per 30 day(s). You may fill up to a 34 day supply at a retail pharmacy. Start Date 06/28/2022, End Date Until Further Notice.

## 2022-07-06 ENCOUNTER — Encounter (INDEPENDENT_AMBULATORY_CARE_PROVIDER_SITE_OTHER): Payer: Medicaid Other | Admitting: Psychiatry

## 2022-07-06 DIAGNOSIS — G43119 Migraine with aura, intractable, without status migrainosus: Secondary | ICD-10-CM

## 2022-07-06 MED ORDER — CELECOXIB 100 MG PO CAPS: 100.0000 mg | ORAL_CAPSULE | ORAL | 6 refills | Status: DC | PRN

## 2022-07-06 NOTE — Telephone Encounter (Signed)

## 2022-07-07 MED ORDER — ZAVZPRET 10 MG/ACT NA SOLN
1.0000 | NASAL | 6 refills | Status: DC | PRN
Start: 2022-07-07 — End: 2022-08-02

## 2022-07-07 NOTE — Telephone Encounter (Signed)
Will advise pt to stop medication, do you have alternative she can try?

## 2022-07-07 NOTE — Addendum Note (Signed)
Addended by: Genia Harold on: 07/07/2022 02:12 PM   Modules accepted: Orders

## 2022-07-12 ENCOUNTER — Telehealth: Payer: Self-pay | Admitting: *Deleted

## 2022-07-12 NOTE — Telephone Encounter (Signed)
Zavzpret PA< Key: [id number], faxed notes to be attached.  Received e mail, documents attached. Your information has been sent to [facility].

## 2022-07-12 NOTE — Telephone Encounter (Signed)
Zavzpret- This drug has been approved. Approved quantity: 6 each per 30 day(s). You may fill up to a 34 day supply at a retail pharmacy. You may fill up to a 90 day supply for maintenance drugs, please refer to the formulary for details. Approval Time frame: Start Date 07/12/2022 End Date 07/12/2023  Sent my chart to advise patient.

## 2022-07-12 NOTE — Telephone Encounter (Signed)
Vyepti PA form completed, given to [employer] to process. Office notes attached.

## 2022-07-12 NOTE — Telephone Encounter (Signed)
Vyepti infusion PA on Dr [employer] desk for completion, to be returned to [employer]. Per Maudie Mercury the patient never received drug in May due to insurance and/or cost issue.

## 2022-07-15 ENCOUNTER — Encounter: Payer: Self-pay | Admitting: Psychiatry

## 2022-07-17 NOTE — Progress Notes (Signed)
Referring Provider: Royann Shivers, * Primary Care Physician:  Royann Shivers, PA-C Primary GI Physician: Dr. Marletta Lor  Chief Complaint  Patient presents with   Follow-up    Hurting on lower left side.    HPI:   Cindy Stevenson is a 46 y.o. female with history of abdominal pain, GERD, IBS, hemorrhoids, and rectal bleeding.  Last seen in our office 03/11/2022 for rectal bleeding, GERD, dysphagia.  GERD was well controlled on Nexium twice daily.  Reported solid food dysphagia occurring about once a month for years.  She was also struggling with constipation that was not adequately managed with a stool softener as well as rectal bleeding that had started back up over the last year and increasing in frequency.  Occasional rectal discomfort related to known hemorrhoids that would prolapse at times.  Rectal exam with external and internal hemorrhoids.  Plan to treat with Anusol rectal cream, Colace 100 mg daily, Benefiber daily, stop dicyclomine, continue Nexium twice daily, arrange colonoscopy and EGD, update CBC.   CBC showed hemoglobin of 12.8.  Procedure 03/24/2022: Colonoscopy: Hemorrhoids on perianal exam, internal hemorrhoids, otherwise normal exam.  Repeat in 10 years.  EGD: Mild Schatzki's ring dilated, 2 gastric polyps removed, otherwise normal exam. Polyps were hyperplastic.    Today:  This other day, she had fairly significant epigastric pain and LLQ abdominal pain. When she able to have a BM, her symptoms improved. She was found to have a cyst on her left ovary in July and wasn't sure if this was contributing, but reports the pain she was having when she was diagnosed with the ovarian cyst has improved since starting birth control.  States she did have repeat ultrasound in [place] after starting birth control and was told that her cyst is shrinking.  She does have chronic intermittent mild left-sided abdominal pain prior to bowel movements and improves thereafter.  This  occurs at least a few times a week.  For chronic constipation, she takes 2 Colace daily and will take 2 stool softeners with a laxative if needed about once a week or less.    Reports that she was started on Ozempic prior to her colonoscopy and developed fairly significant lower abdominal pain and worsening constipation.  She went to the emergency room because she thought it was her appendix but was told it was the Ozempic.  Ozempic was discontinued.  They tried starting her on Mounjaro, but insurance would not cover this and PCP has represcribed Ozempic, but she has not started this.  She has follow-up with PCP on 10/13 to discuss further.  Rectal bleeding rare. One episode last week which was the first time since her colonoscopy.  She is asking if she can have hemorrhoid banding.   Chronic postprandial epigastric abdominal epigastric pain for years.  Some days are worse than others.  No specific food triggers.  Associated nausea, but no vomiting.  Uses Zofran as needed.  No early satiety.  States she can eat quite a bit.  No unintentional weight loss.  Takes Pepto as needed which helps.  GERD is well controlled on Nexium twice daily.  Prior dysphagia resolved.  Ibuprofen once a week or less.  Had a HIDA back in 2012 with normal EF, but mild pain following CCK administration.  Past Medical History:  Diagnosis Date   Anxiety    Breast nodule 08/06/2015   Breast pain, left 08/06/2015   Burning with urination 02/27/2014   Common migraine with intractable  migraine 06/11/2018   Depression    Diabetes mellitus without complication (HCC)    Dysmenorrhea 02/27/2014   Fibroadenoma of right breast 08/26/2015   GERD (gastroesophageal reflux disease)    Headache    Hematuria 02/27/2014   Hypertension    PTSD (post-traumatic stress disorder)    Screening for STD (sexually transmitted disease) 03/27/2014   Seasonal allergies    SUI (stress urinary incontinence, female) 02/27/2014   Vaginal discharge  08/06/2015   Warts, genital 01/23/2015   Yeast infection 08/06/2015    Past Surgical History:  Procedure Laterality Date   BALLOON DILATION N/A 03/24/2022   Procedure: BALLOON DILATION;  Surgeon: Lanelle Bal, DO;  Location: [facility];  Service: Endoscopy;  Laterality: N/A;   BILATERAL SALPINGECTOMY Bilateral 05/09/2017   Procedure: BILATERAL SALPINGECTOMY;  Surgeon: [name], MD;  Location: [facility];  Service: Gynecology;  Laterality: Bilateral;   COLONOSCOPY N/A 09/10/2013   WUJ:WJXBJ;/YNWGN internal hemorrhoids   COLONOSCOPY WITH PROPOFOL N/A 03/24/2022   Surgeon: Lanelle Bal, DO;   Hemorrhoids on perianal exam, internal hemorrhoids, otherwise normal exam.  Repeat in 10 years.   ESOPHAGOGASTRODUODENOSCOPY N/A 09/10/2013   FAO:ZHYQMVHQ ring at the gastroesophagral juctions/mild non-erosive gastritis   ESOPHAGOGASTRODUODENOSCOPY  2017   Dr. Teena Dunk; Schatzki's ring in the distal esophagus, otherwise normal exam.   ESOPHAGOGASTRODUODENOSCOPY (EGD) WITH PROPOFOL N/A 03/24/2022   Surgeon: Lanelle Bal, DO;   Mild Schatzki's ring dilated, 2 gastric polyps removed, otherwise normal exam. Polyps were hyperplastic.   LABIOPLASTY  02/14/2012   Procedure: LABIAPLASTY;  Surgeon: [name], MD;  Location: [facility];  Service: Gynecology;  Laterality: Right;  of the right labia minora   POLYPECTOMY  03/24/2022   Procedure: POLYPECTOMY;  Surgeon: Lanelle Bal, DO;  Location: [facility];  Service: Endoscopy;;   SUPRACERVICAL ABDOMINAL HYSTERECTOMY N/A 05/09/2017   Procedure: HYSTERECTOMY SUPRACERVICAL ABDOMINAL;  Surgeon: [name], MD;  Location: [facility];  Service: Gynecology;  Laterality: N/A;   WISDOM TOOTH EXTRACTION  10/18/1995   Dr. Manson Passey    Current Outpatient Medications  Medication Sig Dispense Refill   ACCU-CHEK GUIDE test strip      Accu-Chek Softclix Lancets lancets SMARTSIG:Topical     acetaminophen (TYLENOL) 500 MG tablet Take 1,000 mg by  mouth every 6 (six) hours as needed for moderate pain.     albuterol (VENTOLIN HFA) 108 (90 Base) MCG/ACT inhaler Inhale 2 puffs into the lungs every 6 (six) hours as needed for shortness of breath or wheezing.     ALPRAZolam (XANAX) 1 MG tablet Take 1 mg by mouth 3 (three) times daily.     baclofen (LIORESAL) 10 MG tablet Take 1/2-1 pill up to 3 times a day as needed for muscle spasm 90 each 5   cetirizine (ZYRTEC) 10 MG tablet Take 10 mg by mouth daily.     Eptinezumab-jjmr (VYEPTI) 100 MG/ML injection Inject 100 mg into the vein. 4  times a year     esomeprazole (NEXIUM) 40 MG capsule Take 40 mg by mouth 2 (two) times daily.      gabapentin (NEURONTIN) 800 MG tablet Take 400 mg by mouth 3 (three) times daily.     hydrochlorothiazide (HYDRODIURIL) 12.5 MG tablet Take 25 mg by mouth daily.     ibuprofen (ADVIL) 600 MG tablet Take 600 mg by mouth 3 (three) times daily.     ketoconazole (NIZORAL) 2 % shampoo Apply 1 application. topically daily as needed (scalp irritation).  naratriptan (AMERGE) 2.5 MG tablet Take 1 tablet (2.5 mg total) by mouth as needed for migraine. Take one (1) tablet at onset of headache; if returns or does not resolve, may repeat after 4 hours; do not exceed five (5) mg in 24 hours. 30 tablet 0   nystatin ointment (MYCOSTATIN) Apply 1 application. topically daily as needed (irritation under breasts).     ondansetron (ZOFRAN) 8 MG tablet Take 8 mg by mouth every 8 (eight) hours as needed for nausea.     Polyethyl Glycol-Propyl Glycol (SYSTANE OP) Place 1 drop into both eyes daily as needed (dry eyes).     PROCTO-MED HC 2.5 % rectal cream APPLY 1 GRAM RECTALLY TWICE DAILY 30 g 1   SYMBICORT 160-4.5 MCG/ACT inhaler Inhale 2 puffs into the lungs 2 (two) times daily.     Zavegepant HCl (ZAVZPRET) 10 MG/ACT SOLN Place 1 spray into the nose as needed (for migraine). Max dose 1 spray in 24 hours 6 each 6   Cholecalciferol (VITAMIN D) 50 MCG (2000 UT) tablet Take 2,000 Units by  mouth daily. (Patient not taking: Reported on 06/23/2022)     No current facility-administered medications for this visit.    Allergies as of 07/18/2022 - Review Complete 07/18/2022  Allergen Reaction Noted   Effexor [venlafaxine] Rash and Hypertension 07/12/2021   Latex Dermatitis 05/09/2016   Codeine Nausea And Vomiting 02/13/2012   Shellfish allergy Hives 02/13/2012   Sulfa antibiotics Swelling 07/18/2022   Amoxicillin Rash 01/23/2015   Ceftin [cefuroxime] Rash 12/09/2020    Family History  Problem Relation Age of Onset   Lung cancer Mother    Asthma Sister    Allergies Sister    Mental illness Maternal Aunt        depression, PTSD   Diabetes Maternal Grandmother    Cancer Maternal Grandfather        skin   Colon cancer Neg Hx     Social History   Socioeconomic History   Marital status: Single    Spouse name: Not on file   Number of children: Not on file   Years of education: Not on file   Highest education level: Not on file  Occupational History   Occupation: disability    Employer: NOT EMPLOYED  Tobacco Use   Smoking status: Never   Smokeless tobacco: Never  Vaping Use   Vaping Use: Never used  Substance and Sexual Activity   Alcohol use: No   Drug use: No   Sexual activity: Not Currently    Birth control/protection: Surgical    Comment: [facility]  Other Topics Concern   Not on file  Social History Narrative   Not on file   Social Determinants of Health   Financial Resource Strain: High Risk (05/14/2020)   Overall Financial Resource Strain (CARDIA)    Difficulty of Paying Living Expenses: Very hard  Food Insecurity: Food Insecurity Present (05/14/2020)   Hunger Vital Sign    Worried About Radiation protection practitioner of Food in the Last Year: Often true    Ran Out of Food in the Last Year: Often true  Transportation Needs: No Transportation Needs (05/14/2020)   PRAPARE - Administrator, Civil Service (Medical): No    Lack of Transportation (Non-Medical): No   Physical Activity: Insufficiently Active (05/14/2020)   Exercise Vital Sign    Days of Exercise per Week: 2 days    Minutes of Exercise per Session: 10 min  Stress: Stress Concern Present (05/14/2020)  [employer] of Occupational Health - Occupational Stress Questionnaire    Feeling of Stress : Very much  Social Connections: Socially Isolated (05/14/2020)   Social Connection and Isolation Panel [NHANES]    Frequency of Communication with Friends and Family: Never    Frequency of Social Gatherings with Friends and Family: Never    Attends Religious Services: Never    Database administrator or Organizations: No    Attends Engineer, structural: Never    Marital Status: Never married    Review of Systems: Gen: Denies fever, chills, cold or flulike symptoms, presyncope, syncope. CV: Denies chest pain, palpitations. Resp: Denies dyspnea, cough.  GI: See HPI  Heme: See HPI  Physical Exam: BP (!) 149/95 (BP Location: Right Arm, Patient Position: Sitting, Cuff Size: Large)   Pulse (!) 108   Temp 98 F (36.7 C) (Temporal)   Ht 5\' 1"  (1.549 m)   Wt 244 lb 6.4 oz (110.9 kg)   LMP 04/21/2017 Comment: SCH  SpO2 98%   BMI 46.18 kg/m  General:   Alert and oriented. No distress noted. Pleasant and cooperative.  Head:  Normocephalic and atraumatic. Eyes:  Conjuctiva clear without scleral icterus. Heart:  S1, S2 present without murmurs appreciated. Lungs:  Clear to auscultation bilaterally. No wheezes, rales, or rhonchi. No distress.  Abdomen:  +BS, soft, non-tender and non-distended. No rebound or guarding. No HSM or masses noted. Msk:  Symmetrical without gross deformities. Normal posture. Extremities:  Without edema. Neurologic:  Alert and  oriented x4 Psych:  Normal mood and affect.    Assessment:   46 y.o. female with history of abdominal pain, GERD, IBS, hemorrhoids, and rectal bleeding, presenting today for follow-up.  Chronic abdominal pain: Chronic  postprandial epigastric abdominal pain with associated nausea without vomiting as well as intermittent epigastric and left-sided abdominal pain prior to bowel movements with improvement thereafter.  Symptoms likely multifactorial in the setting of IBS, GERD, and query component of biliary dyskinesia.  GERD is well managed at this time.  Denies early satiety.  Ibuprofen only about once a week.  Recent EGD 03/24/2022 with 2 hyperplastic gastric polyps, mild Schatzki's ring that was dilated. We will plan to try her on Linzess 72 mcg and arrange HIDA for further evaluation.   Chronic constipation/IBS: Symptoms fairly well controlled on ways and stool softener with laxative as needed, but I suspect we could gain better control of her global symptoms with Linzess.  We will try starting at the lower dose of Linzess in efforts not to cause diarrhea.  Samples provided today and requested progress report next week.  Rectal bleeding/hemorrhoids: Chronic intermittent but infrequent rectal bleeding in the setting of hemorrhoids.  Colonoscopy up-to-date in June 2023 with external and internal hemorrhoids noted, otherwise normal exam.  Patient asking about hemorrhoid banding today.  Discussed that we cannot band external hemorrhoids, but could consider banding of internal hemorrhoids.  She would like to see one of the providers that offer hemorrhoid banding for further discussion and consideration.  GERD:  Well-controlled on Nexium 40 mg twice daily.  Dysphagia: Resolved s/p dilation of Schatzki's ring in June 2023.    Plan:  HIDA Stop Colace and stool softener with laxative. Start the 72 mcg daily 30 minutes before breakfast.  Samples provided.  Requested progress report in 1 week. Limit toilet time to 2-3 minutes. Avoid straining. Continue Procto-Med HC rectal cream as needed. Continue Nexium 40 mg twice daily. Follow-up with Lewie Loron, NP for further discussion/consideration  of hemorrhoid  banding.   Ermalinda Memos, PA-C [facility] 07/18/2022

## 2022-07-18 ENCOUNTER — Ambulatory Visit
Admission: EM | Admit: 2022-07-18 | Discharge: 2022-07-18 | Disposition: A | Discharge status: Home / Self Care | Payer: Medicaid Other | Attending: Family Medicine | Admitting: Family Medicine

## 2022-07-18 ENCOUNTER — Encounter: Payer: Self-pay | Admitting: Gastroenterology

## 2022-07-18 ENCOUNTER — Ambulatory Visit (INDEPENDENT_AMBULATORY_CARE_PROVIDER_SITE_OTHER): Payer: Medicaid Other | Admitting: Gastroenterology

## 2022-07-18 VITALS — BP 149/95 | HR 108 | Temp 98.0°F | Ht 61.0 in | Wt 244.4 lb

## 2022-07-18 DIAGNOSIS — K5900 Constipation, unspecified: Secondary | ICD-10-CM | POA: Diagnosis not present

## 2022-07-18 DIAGNOSIS — R1013 Epigastric pain: Secondary | ICD-10-CM | POA: Diagnosis not present

## 2022-07-18 DIAGNOSIS — K649 Unspecified hemorrhoids: Secondary | ICD-10-CM | POA: Diagnosis not present

## 2022-07-18 DIAGNOSIS — R1032 Left lower quadrant pain: Secondary | ICD-10-CM | POA: Diagnosis not present

## 2022-07-18 DIAGNOSIS — R21 Rash and other nonspecific skin eruption: Secondary | ICD-10-CM | POA: Diagnosis not present

## 2022-07-18 DIAGNOSIS — R110 Nausea: Secondary | ICD-10-CM

## 2022-07-18 MED ORDER — TRIAMCINOLONE ACETONIDE 0.1 % EX CREA: 1.0000 | TOPICAL_CREAM | Freq: Two times a day (BID) | CUTANEOUS | 0 refills | Status: DC

## 2022-07-18 MED ORDER — BACLOFEN 10 MG PO TABS: ORAL_TABLET | ORAL | 5 refills | Status: DC

## 2022-07-18 NOTE — Addendum Note (Signed)
Addended by: Genia Harold on: 07/18/2022 02:49 PM   Modules accepted: Orders

## 2022-07-18 NOTE — Patient Instructions (Signed)
We will arrange to have a HIDA scan at [facility].  Stop stool softener and stool softener laxative.  Start Linzess 72 mcg daily 30 minutes before breakfast.  We are providing you with samples.  Please call next week with a progress report. As we discussed, Linzess can cause diarrhea, but this should taper off.  Limit toilet time to 2-3 minutes.  Avoid straining.  Continue to use Anusol rectal cream as needed for hemorrhoids.  Continue Nexium 40 mg twice daily 30 minutes before breakfast and dinner.  We will arrange you to have a follow-up with Roseanne Kaufman, NP for consideration of hemorrhoid banding.  As we discussed, we cannot band external hemorrhoids, but can band internal hemorrhoids.  Aliene Altes, PA-C [facility]

## 2022-07-18 NOTE — Telephone Encounter (Signed)

## 2022-07-18 NOTE — ED Triage Notes (Signed)
Pt presents with redness to left lower leg that may be insect bite

## 2022-07-18 NOTE — ED Provider Notes (Signed)
[facility]    CSN: [id number] Arrival date & time: 07/18/22  1659      History   Chief Complaint Chief Complaint  Patient presents with   Insect Bite    HPI Cindy Stevenson is a 46 y.o. female.   Presenting today with red, itchy area to the left medial lower leg that has been increasing in size since she first noticed it this morning.  She now has a tiny itchy spot as well to the right ankle.  She is not sure if it was an insect bite or what it might have been.  She states she does take care of a stray cat daily who rubs on her ankles and not sure if she had something on her that she passed.  Has been trying hydrocortisone, Neosporin, Benadryl with no relief.    Past Medical History:  Diagnosis Date   Anxiety    Breast nodule 08/06/2015   Breast pain, left 08/06/2015   Burning with urination 02/27/2014   Common migraine with intractable migraine 06/11/2018   Depression    Diabetes mellitus without complication ([place])    Dysmenorrhea 02/27/2014   Fibroadenoma of right breast 08/26/2015   GERD (gastroesophageal reflux disease)    Headache    Hematuria 02/27/2014   Hypertension    PTSD (post-traumatic stress disorder)    Screening for STD (sexually transmitted disease) 03/27/2014   Seasonal allergies    SUI (stress urinary incontinence, female) 02/27/2014   Vaginal discharge 08/06/2015   Warts, genital 01/23/2015   Yeast infection 08/06/2015    Patient Active Problem List   Diagnosis Date Noted   Constipation 07/18/2022   Abdominal pain, epigastric 07/18/2022   LLQ abdominal pain 07/18/2022   Hemorrhoids 03/11/2022   Vaginal irritation 12/09/2020   Screening examination for STD (sexually transmitted disease) 12/09/2020   Anxiety and depression 12/09/2020   Mixed stress and urge urinary incontinence 12/09/2020   Low back pain 08/24/2020   Routine medical exam 05/14/2020   Encounter for screening fecal occult blood testing 05/14/2020   Current use of estrogen  therapy 05/14/2020   Hot flashes 04/30/2020   Moody 04/30/2020   Common migraine with intractable migraine 06/11/2018   Status post abdominal supracervical subtotal hysterectomy 05/09/2017   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   Abnormal uterine bleeding (AUB) 03/09/2017   Depression 03/09/2017   Pelvic pain 03/09/2017   Irritable bowel syndrome 05/09/2016   GERD (gastroesophageal reflux disease) 05/09/2016   Fibroadenoma of right breast 08/26/2015   Vaginal discharge 08/06/2015   Yeast infection 08/06/2015   Breast nodule 08/06/2015   Breast pain, left 08/06/2015   Postop check 03/25/2015   Warts, genital 01/23/2015   Screening for STD (sexually transmitted disease) 03/27/2014   Burning with urination 02/27/2014   Hematuria 02/27/2014   SUI (stress urinary incontinence, female) 02/27/2014   Dysmenorrhea 02/27/2014   Abdominal pain, generalized 09/06/2013   Rectal bleeding 09/06/2013    Past Surgical History:  Procedure Laterality Date   BALLOON DILATION N/A 03/24/2022   Procedure: BALLOON DILATION;  Surgeon: Eloise Harman, DO;  [facility];  Service: Endoscopy;  Laterality: N/A;   BILATERAL SALPINGECTOMY Bilateral 05/09/2017   Procedure: BILATERAL SALPINGECTOMY;  Surgeon: Jonnie Kind, MD;  Location: [facility];  Service: Gynecology;  Laterality: Bilateral;   COLONOSCOPY N/A 09/10/2013   MHD:QQIWL;/NLGXQ internal hemorrhoids   COLONOSCOPY WITH PROPOFOL N/A 03/24/2022   Surgeon: Eloise Harman, DO;   Hemorrhoids on perianal exam, internal hemorrhoids,  otherwise normal exam.  Repeat in 10 years.   ESOPHAGOGASTRODUODENOSCOPY N/A 09/10/2013   SJG:GEZMOQHU ring at the gastroesophagral juctions/mild non-erosive gastritis   ESOPHAGOGASTRODUODENOSCOPY  2017   Dr. Britta Mccreedy; Schatzki's ring in the distal esophagus, otherwise normal exam.   ESOPHAGOGASTRODUODENOSCOPY (EGD) WITH PROPOFOL N/A 03/24/2022   Surgeon: Eloise Harman, DO;   Mild Schatzki's ring  dilated, 2 gastric polyps removed, otherwise normal exam. Polyps were hyperplastic.   LABIOPLASTY  02/14/2012   Procedure: LABIAPLASTY;  Surgeon: Jonnie Kind, MD;  Location: [facility];  Service: Gynecology;  Laterality: Right;  of the right labia minora   POLYPECTOMY  03/24/2022   Procedure: POLYPECTOMY;  Surgeon: Eloise Harman, DO;  Location: [facility];  Service: Endoscopy;;   SUPRACERVICAL ABDOMINAL HYSTERECTOMY N/A 05/09/2017   Procedure: HYSTERECTOMY SUPRACERVICAL ABDOMINAL;  Surgeon: Jonnie Kind, MD;  Location: [facility];  Service: Gynecology;  Laterality: N/A;   WISDOM TOOTH EXTRACTION  10/18/1995   Dr. Owens Shark    OB History     Gravida  1   Para      Term      Preterm      AB  1   Living         SAB      IAB  1   Ectopic      Multiple      Live Births               Home Medications    Prior to Admission medications   Medication Sig Start Date End Date Taking? Authorizing Provider  triamcinolone cream (KENALOG) 0.1 % Apply 1 Application topically 2 (two) times daily. 07/18/22  Yes Volney American, PA-C  ACCU-CHEK GUIDE test strip  04/23/21   [provider]  Accu-Chek Softclix Lancets lancets SMARTSIG:Topical 04/22/21   [provider]  acetaminophen (TYLENOL) 500 MG tablet Take 1,000 mg by mouth every 6 (six) hours as needed for moderate pain.    [provider]  albuterol (VENTOLIN HFA) 108 (90 Base) MCG/ACT inhaler Inhale 2 puffs into the lungs every 6 (six) hours as needed for shortness of breath or wheezing. 12/10/20   [provider]  ALPRAZolam Duanne Moron) 1 MG tablet Take 1 mg by mouth 3 (three) times daily.    [provider]  baclofen (LIORESAL) 10 MG tablet Take 1/2-1 pill up to 3 times a day as needed for muscle spasm 07/18/22   Genia Harold, MD  cetirizine (ZYRTEC) 10 MG tablet Take 10 mg by mouth daily. 12/06/20   [provider]  Cholecalciferol (VITAMIN D) 50 MCG (2000 UT) tablet  Take 2,000 Units by mouth daily. Patient not taking: Reported on 06/23/2022    [provider]  Eptinezumab-jjmr (VYEPTI) 100 MG/ML injection Inject 100 mg into the vein. 4  times a year    [provider]  esomeprazole (NEXIUM) 40 MG capsule Take 40 mg by mouth 2 (two) times daily.     [provider]  gabapentin (NEURONTIN) 800 MG tablet Take 400 mg by mouth 3 (three) times daily. 12/03/20   [provider]  hydrochlorothiazide (HYDRODIURIL) 12.5 MG tablet Take 25 mg by mouth daily. 03/09/21   [provider]  ibuprofen (ADVIL) 600 MG tablet Take 600 mg by mouth 3 (three) times daily. 05/10/21   [provider]  ketoconazole (NIZORAL) 2 % shampoo Apply 1 application. topically daily as needed (scalp irritation). 07/30/20   [provider]  naratriptan (AMERGE) 2.5 MG  tablet Take 1 tablet (2.5 mg total) by mouth as needed for migraine. Take one (1) tablet at onset of headache; if returns or does not resolve, may repeat after 4 hours; do not exceed five (5) mg in 24 hours. 06/23/22 09/21/22  Genia Harold, MD  nystatin ointment (MYCOSTATIN) Apply 1 application. topically daily as needed (irritation under breasts).    [provider]  ondansetron (ZOFRAN) 8 MG tablet Take 8 mg by mouth every 8 (eight) hours as needed for nausea. 02/14/22   [provider]  Polyethyl Glycol-Propyl Glycol (SYSTANE OP) Place 1 drop into both eyes daily as needed (dry eyes).    [provider]  PROCTO-MED HC 2.5 % rectal cream APPLY 1 GRAM RECTALLY TWICE DAILY 06/21/22   Erenest Rasher, PA-C  SYMBICORT 160-4.5 MCG/ACT inhaler Inhale 2 puffs into the lungs 2 (two) times daily. 02/27/21   [provider]  Zavegepant HCl (ZAVZPRET) 10 MG/ACT SOLN Place 1 spray into the nose as needed (for migraine). Max dose 1 spray in 24 hours 07/07/22   Genia Harold, MD    Family History Family History  Problem Relation Age of Onset   Lung  cancer Mother    Asthma Sister    Allergies Sister    Mental illness Maternal Aunt        depression, PTSD   Diabetes Maternal Grandmother    Cancer Maternal Grandfather        skin   Colon cancer Neg Hx     Social History Social History   Tobacco Use   Smoking status: Never   Smokeless tobacco: Never  Vaping Use   Vaping Use: Never used  Substance Use Topics   Alcohol use: No   Drug use: No     Allergies   Effexor [venlafaxine], Latex, Codeine, Shellfish allergy, Sulfa antibiotics, Amoxicillin, and Ceftin [cefuroxime]   Review of Systems Review of Systems Per HPI  Physical Exam Triage Vital Signs ED Triage Vitals  Enc Vitals Group     BP 07/18/22 1736 129/87     Pulse --      Resp 07/18/22 1736 20     Temp 07/18/22 1736 98.5 F (36.9 C)     Temp src --      SpO2 07/18/22 1736 95 %     Weight --      Height --      Head Circumference --      Peak Flow --      Pain Score 07/18/22 1738 2     Pain Loc --      Pain Edu? --      Excl. in [place]? --    No data found.  Updated Vital Signs BP 129/87   Temp 98.5 F (36.9 C)   Resp 20   LMP 04/21/2017 Comment: [place]  SpO2 95%   Visual Acuity Right Eye Distance:   Left Eye Distance:   Bilateral Distance:    Right Eye Near:   Left Eye Near:    Bilateral Near:     Physical Exam Vitals and nursing note reviewed.  Constitutional:      Appearance: Normal appearance. She is not ill-appearing.  HENT:     Head: Atraumatic.  Eyes:     Extraocular Movements: Extraocular movements intact.     Conjunctiva/sclera: Conjunctivae normal.  Cardiovascular:     Rate and Rhythm: Normal rate and regular rhythm.     Heart sounds: Normal heart sounds.  Pulmonary:  Effort: Pulmonary effort is normal.     Breath sounds: Normal breath sounds.  Musculoskeletal:        General: Normal range of motion.     Cervical back: Normal range of motion and neck supple.  Skin:    General: Skin is warm and dry.     Findings:  Rash present.     Comments: Erythematous linear lesion starting to blister in certain areas to the left lower medial leg and now similar spot appearing on the right ankle medially  Neurological:     Mental Status: She is alert and oriented to person, place, and time.  Psychiatric:        Mood and Affect: Mood normal.        Thought Content: Thought content normal.        Judgment: Judgment normal.      UC Treatments / Results  Labs (all labs ordered are listed, but only abnormal results are displayed) Labs Reviewed - No data to display  EKG   Radiology No results found.  Procedures Procedures (including critical care time)  Medications Ordered in UC Medications - No data to display  Initial Impression / Assessment and Plan / UC Course  I have reviewed the triage vital signs and the nursing notes.  Pertinent labs & imaging results that were available during my care of the patient were reviewed by me and considered in my medical decision making (see chart for details).     Suspect early poison ivy dermatitis.  Treat with triamcinolone, antihistamines, avoid scratching.  Return for worsening symptoms.  Final Clinical Impressions(s) / UC Diagnoses   Final diagnoses:  Rash   Discharge Instructions   None    ED Prescriptions     Medication Sig Dispense Auth. Provider   triamcinolone cream (KENALOG) 0.1 % Apply 1 Application topically 2 (two) times daily. 80 g Volney American, [place]      PDMP not reviewed this encounter.   Volney American, [place] 07/18/22 1829

## 2022-07-19 ENCOUNTER — Telehealth: Payer: Self-pay | Admitting: *Deleted

## 2022-07-19 MED ORDER — KETOROLAC TROMETHAMINE 10 MG PO TABS: 10.0000 mg | ORAL_TABLET | Freq: Three times a day (TID) | ORAL | 5 refills | Status: DC

## 2022-07-19 NOTE — Telephone Encounter (Signed)
Pt informed of HIDA appointment at [facility] on 07/22/22 at 10:00 am arrive at 9:45 am, nothing to eat or drink after midnight, and no pain medication prior to procedure. Verbalized understanding.

## 2022-07-19 NOTE — Telephone Encounter (Signed)
I sent an rx for Toradol to her pharmacy

## 2022-07-19 NOTE — Addendum Note (Signed)
Addended by: Genia Harold on: 07/19/2022 09:36 AM   Modules accepted: Orders

## 2022-07-22 ENCOUNTER — Encounter (HOSPITAL_COMMUNITY)
Admission: RE | Admit: 2022-07-22 | Discharge: 2022-07-22 | Disposition: A | Discharge status: Home / Self Care | Payer: Medicaid Other | Source: Ambulatory Visit | Attending: Gastroenterology | Admitting: Gastroenterology

## 2022-07-22 DIAGNOSIS — R1013 Epigastric pain: Secondary | ICD-10-CM | POA: Diagnosis present

## 2022-07-22 DIAGNOSIS — R110 Nausea: Secondary | ICD-10-CM | POA: Diagnosis present

## 2022-07-22 MED ORDER — TECHNETIUM TC 99M MEBROFENIN IV KIT
5.0000 | PACK | Freq: Once | INTRAVENOUS | Status: AC | PRN
  Administered 2022-07-22: 5 via INTRAVENOUS

## 2022-07-22 MED ORDER — SINCALIDE 5 MCG IJ SOLR
INTRAMUSCULAR | Status: AC
  Administered 2022-07-22: 2.23 ug
  Filled 2022-07-22: qty 5

## 2022-07-22 MED ORDER — STERILE WATER FOR INJECTION IJ SOLN
INTRAMUSCULAR | Status: AC
  Administered 2022-07-22: 2.23 mL
  Filled 2022-07-22: qty 10

## 2022-07-26 ENCOUNTER — Telehealth: Payer: Self-pay | Admitting: *Deleted

## 2022-07-26 NOTE — Telephone Encounter (Signed)
I sent a refill for 30 tablets, but that is a 90 day supply so she shouldn't need a refill for 3 months. We can go back to 12 tablets every month if she prefers, but typically insurance won't let pharmacies refill those before 30 days.

## 2022-07-26 NOTE — Telephone Encounter (Signed)
Patient requesting naratriptan be prescribed for 12 tabs every 23 days per what her insurance will allow.  I called [facility], spoke with Juliann Pulse. She stated they received Rx on  06/23/22 for #30 tabs and her insurance allowed 12 tabs to be filled that day. She then got refill on 10/1 for #12,  6 tabs remaining on this Rx which was supposed to last for 3 months. In past month patient has received 24 Naratriptan tabs. Routed to MD.

## 2022-07-27 ENCOUNTER — Encounter: Payer: Self-pay | Admitting: Psychiatry

## 2022-07-27 ENCOUNTER — Other Ambulatory Visit: Payer: Self-pay

## 2022-07-27 MED ORDER — DICLOFENAC POTASSIUM(MIGRAINE) 50 MG PO PACK: PACK | ORAL | 6 refills | Status: DC

## 2022-07-28 ENCOUNTER — Ambulatory Visit (INDEPENDENT_AMBULATORY_CARE_PROVIDER_SITE_OTHER): Payer: Medicaid Other

## 2022-07-28 VITALS — BP 121/80 | HR 96 | Temp 98.3°F | Resp 16 | Ht 61.0 in | Wt 241.4 lb

## 2022-07-28 DIAGNOSIS — G43019 Migraine without aura, intractable, without status migrainosus: Secondary | ICD-10-CM | POA: Diagnosis not present

## 2022-07-28 MED ORDER — SODIUM CHLORIDE 0.9 % IV SOLN
300.0000 mg | Freq: Once | INTRAVENOUS | Status: AC
  Administered 2022-07-28: 300 mg via INTRAVENOUS
  Filled 2022-07-28: qty 3

## 2022-07-28 NOTE — Progress Notes (Signed)
Diagnosis: Migraines  Provider:  Marshell Garfinkel MD  Procedure: Infusion  IV Type: Peripheral, IV Location: L Forearm  Vyepti (Eptinezumab-jjmr), Dose: 300 mg  Infusion Start Time: 1350  Infusion Stop Time: 1308  Post Infusion IV Care: Peripheral IV Discontinued  Discharge: Condition: Good, Destination: Home . AVS provided to patient.   Performed by:  Koren Shiver, RN

## 2022-07-30 ENCOUNTER — Other Ambulatory Visit: Payer: Self-pay | Admitting: Psychiatry

## 2022-08-01 ENCOUNTER — Telehealth: Payer: Self-pay | Admitting: *Deleted

## 2022-08-01 ENCOUNTER — Telehealth: Payer: Self-pay | Admitting: Pharmacy Technician

## 2022-08-01 ENCOUNTER — Encounter: Payer: Self-pay | Admitting: *Deleted

## 2022-08-01 NOTE — Telephone Encounter (Signed)
Can I get a script to fill out for her Vyepti? I should be on the infusion list now

## 2022-08-01 NOTE — Telephone Encounter (Signed)
Script placed on MD desk for review and signature

## 2022-08-01 NOTE — Telephone Encounter (Signed)
This drug has been approved. Approved quantity: 15 packets per 30 day(s). You may fill up to a 34 day supply at a retail pharmacy. 08/01/22 End Date 08/01/2023.

## 2022-08-01 NOTE — Telephone Encounter (Signed)
Diclofenac packet PA, Key: [id number]  Your information has been sent to [facility].

## 2022-08-01 NOTE — Telephone Encounter (Signed)
Dr. Billey Gosling,  Patient is enrolled in the [facility] Patient Assistance Prgram (free drug). We will need a new script. Please sign script and return to me.  Script has been faxed to: [phone]  Thanks Maudie Mercury

## 2022-08-02 ENCOUNTER — Telehealth: Payer: Self-pay | Admitting: Psychiatry

## 2022-08-02 ENCOUNTER — Encounter: Payer: Self-pay | Admitting: Psychiatry

## 2022-08-02 MED ORDER — NARATRIPTAN HCL 2.5 MG PO TABS: 2.5000 mg | ORAL_TABLET | ORAL | 6 refills | Status: DC | PRN

## 2022-08-02 NOTE — Telephone Encounter (Signed)
[facility] [place]) requesting a new prescription due to increase in dosage for Eptinezumab-jjmr (VYEPTI) 100 MG/ML injection. Would like a call from the nurse.

## 2022-08-02 NOTE — Telephone Encounter (Signed)
From placed on MD desk to sign yesterday

## 2022-08-02 NOTE — Telephone Encounter (Signed)
Contacted claudia back, she verified Rx was received. Will get it process.

## 2022-08-02 NOTE — Telephone Encounter (Signed)
Received fax from Obie Dredge, pharmacy tech, re: Corinne Ports PAP (free drug);  placed on MD desk for signature.

## 2022-08-02 NOTE — Telephone Encounter (Signed)
Rx was faxed back to [phone] as rq. Forms given to Kim in [place].

## 2022-08-04 ENCOUNTER — Ambulatory Visit: Payer: Medicaid Other | Admitting: Cardiology

## 2022-08-05 NOTE — Telephone Encounter (Signed)
Cindy Stevenson, Good Morning,  just wanted to f/u on the vyepti script.  If not already done,  please fax back to me. Thanks Maudie Mercury

## 2022-08-08 NOTE — Telephone Encounter (Signed)
Marcille Blanco, Thanks for the update.

## 2022-08-08 NOTE — Telephone Encounter (Signed)
Cindy Stevenson,  I faxed those forms back to [facility] as requested [phone], I spoke to her last week on the phone and she confirmed she received it as we were speaking.  What is your fax?

## 2022-08-08 NOTE — Telephone Encounter (Signed)
I just got off the phone with the patient intake team and they said they have it.

## 2022-08-09 ENCOUNTER — Telehealth: Payer: Self-pay | Admitting: Pharmacy Technician

## 2022-08-09 NOTE — Telephone Encounter (Signed)
Noted, thank you

## 2022-08-09 NOTE — Telephone Encounter (Signed)
Algis Liming f/u:  Medication has been received and patient has been scheduled.  Thanks for your help. Maudie Mercury

## 2022-08-10 ENCOUNTER — Telehealth: Payer: Self-pay | Admitting: *Deleted

## 2022-08-10 ENCOUNTER — Encounter: Payer: Self-pay | Admitting: *Deleted

## 2022-08-10 NOTE — Telephone Encounter (Signed)
Noted  

## 2022-08-10 NOTE — Telephone Encounter (Signed)
Pt called and states she was sick last week that is why she didn't come by and get samples of Linzess. She states she will be here later today.

## 2022-08-12 ENCOUNTER — Other Ambulatory Visit: Payer: Self-pay | Admitting: Gastroenterology

## 2022-08-12 DIAGNOSIS — K625 Hemorrhage of anus and rectum: Secondary | ICD-10-CM

## 2022-08-12 DIAGNOSIS — K649 Unspecified hemorrhoids: Secondary | ICD-10-CM

## 2022-08-15 ENCOUNTER — Telehealth: Payer: Self-pay | Admitting: Psychiatry

## 2022-08-15 ENCOUNTER — Telehealth: Payer: Self-pay | Admitting: Pharmacy Technician

## 2022-08-15 ENCOUNTER — Encounter: Payer: Self-pay | Admitting: Psychiatry

## 2022-08-15 ENCOUNTER — Ambulatory Visit (INDEPENDENT_AMBULATORY_CARE_PROVIDER_SITE_OTHER): Payer: Medicaid Other | Admitting: Psychiatry

## 2022-08-15 VITALS — BP 154/99 | HR 92 | Ht 61.0 in | Wt 241.0 lb

## 2022-08-15 DIAGNOSIS — H9212 Otorrhea, left ear: Secondary | ICD-10-CM

## 2022-08-15 DIAGNOSIS — M542 Cervicalgia: Secondary | ICD-10-CM | POA: Diagnosis not present

## 2022-08-15 NOTE — Progress Notes (Signed)
   CC:  headaches  Follow-up Visit  Last visit: 06/23/22  Brief HPI: 46 year old female with a history of DM who follows in clinic for chronic migraines.  At her last visit, Vyepti was increased to 300 mg every 3 months.  Interval History: Since her last visit she has continued to have headaches. Had her first 300 mg dose of Vyepti 2 weeks ago. She does feel like the higher dose was more helpful.  She has sent multiple messages requesting early refills on naratriptan and there is concern for her pharmacy that she has been overusing triptans. Diclofenac pills caused GI upset so she was prescribed dissolvable packets. This has been helping. Baclofen has also been helping.  She also reports clear fluid draining out of her left ear night.   Current Headache Regimen: Preventative: Vyepti 300 mg every 3 months Abortive: naratriptan 2.5 mg PRN   Prior Therapies                                  Preventive: Prozac Paxil Cymbalta Amitriptyline Topamax Gabapentin 400 mg TID Metoprolol Lisinopril losartan Ajovy Aimovig Emgality Vyepti 300 mg  Botox - worsened neck pain Occipital nerve block Neck PT   Rescue: Naratriptan 2.5 mg PRN Maxalt - lack of efficacy Imitrex - lack of efficacy Nurtec - lack of efficacy Ubrelvy - lack of efficacy Zavzpret - lack of efficacy Diclofenac  - stomach upset baclofen  Physical Exam:   Vital Signs: LMP 04/21/2017 Comment: [facility] GENERAL:  well appearing, in no acute distress, alert  SKIN:  Color, texture, turgor normal. No rashes or lesions HEAD:  Normocephalic/atraumatic. RESP: normal respiratory effort MSK:  No gross joint deformities.   NEUROLOGICAL: Mental Status: Alert, oriented to person, place and time, Follows commands, and Speech fluent and appropriate. Cranial Nerves: PERRL, face symmetric, no dysarthria, hearing grossly intact Motor: moves all extremities equally Gait: normal-based.  IMPRESSION: 46 year old female with a  history of DM who presents for follow up of chronic migraines. She has noticed some improvement with Vyepti 300 mg every 3 months. States she has been able to cut down on her naratriptan use with PRN diclofenac and baclofen. Reiterated importance of limiting triptan use. Declined request for more frequent triptan refills.  PLAN: -Preventive: Continue Vyepti 300 mg every 3 months -Rescue: Continue naratriptan 2.5 PRN, baclofen 5-10 mg PRN, Cambia 50-100 mg PRN -Return for occipital nerve block -Next steps: Consider Qulipta  Follow-up: scheduled for 01/03/22  I spent a total of 30 minutes on the date of the service. Headache education was done. Discussed treatment options including preventive and acute medications. Discussed medication overuse headache and to limit use of acute treatments to no more than 2 days/week or 10 days/month. Discussed medication side effects, adverse reactions and drug interactions. Written educational materials and patient instructions outlining all of the above were given.  Genia Harold, MD 08/15/22 10:21 AM

## 2022-08-15 NOTE — Telephone Encounter (Signed)
PT referral sent to [facility] in [place], phone # [phone].  ENT referral sent to [facility], phone # [phone].

## 2022-08-15 NOTE — Patient Instructions (Signed)
Try to limit rescue medication to 2 days per week to avoid rebound headaches

## 2022-08-15 NOTE — Telephone Encounter (Addendum)
F/U: Vyepti,  Patient now has current insurance, [place] [employer] and will no longer qualify for Vyepti (free drug). Patient will be buy/bill.  Auth Submission: NO AUTH NEEDED Payer: [place] Medicaid [employer] Medication & CPT/J Code(s) submitted: Vyepti (Eptinezumab) [id number] Route of submission (phone, fax, portal):  Phone # [phone] Fax # Auth type: Buy/Bill Units/visits requested: '300mg'$  q38moReference number: [id number] from: 08/15/22 to 08/16/23

## 2022-08-18 ENCOUNTER — Ambulatory Visit: Payer: Medicaid Other | Attending: Internal Medicine | Admitting: Internal Medicine

## 2022-08-18 ENCOUNTER — Telehealth: Payer: Self-pay | Admitting: Internal Medicine

## 2022-08-18 ENCOUNTER — Encounter: Payer: Self-pay | Admitting: Internal Medicine

## 2022-08-18 ENCOUNTER — Encounter: Payer: Self-pay | Admitting: *Deleted

## 2022-08-18 VITALS — BP 132/90 | HR 88 | Ht 61.0 in | Wt 236.4 lb

## 2022-08-18 DIAGNOSIS — E782 Mixed hyperlipidemia: Secondary | ICD-10-CM | POA: Diagnosis not present

## 2022-08-18 DIAGNOSIS — R0609 Other forms of dyspnea: Secondary | ICD-10-CM | POA: Diagnosis not present

## 2022-08-18 DIAGNOSIS — Z79899 Other long term (current) drug therapy: Secondary | ICD-10-CM

## 2022-08-18 DIAGNOSIS — E785 Hyperlipidemia, unspecified: Secondary | ICD-10-CM | POA: Insufficient documentation

## 2022-08-18 NOTE — Telephone Encounter (Signed)
Checking percert on the following patient for testing scheduled at [facility].   [facility]

## 2022-08-18 NOTE — Progress Notes (Signed)
Cardiology Office Note  Date: 08/18/2022   ID: Cindy Stevenson, DOB November 03, 1975, MRN [id number]  PCP:  Rosalee Kaufman, PA-C  Cardiologist:  Chalmers Guest, MD Electrophysiologist:  None   Reason for Office Visit: Evaluation of SOB at the request of Saunders Glance, PA-C   History of Present Illness: Cindy Stevenson is a 46 y.o. female known to have HLD was referred to cardiology clinic for evaluation of SOB at the request of [place], PA-C.  Patient stated that she gets short of breath and experiences palpitations whenever she exerts herself. She even had to go to the ER a few months ago for the similar symptoms. CT PE was performed which showed no evidence of PE and she was discharged to follow-up with PCP/cardiology for stress test. Otherwise patient denied any chest pain/angina, dizziness/lightheadedness, syncope, LE swelling. Patient is not physically active at baseline and stated she gets short of breath with household chores sometimes. Denied any orthopnea/PND or LE swelling. Denies smoking cigarettes, alcohol use or illicit drug abuse.  Patient's grandmother of 57 years old had CABG recently and patient is extremely worried if she also has blockages in her heart vessels.  Due to distress, she feels SOB.  She had taken rosuvastatin 5 mg in the past for HLD but had to discontinue it due to myalgias. I do not have a lipid panel report to review.  Past Medical History:  Diagnosis Date   Anxiety    Breast nodule 08/06/2015   Breast pain, left 08/06/2015   Burning with urination 02/27/2014   Common migraine with intractable migraine 06/11/2018   Depression    Diabetes mellitus without complication ([place])    Dysmenorrhea 02/27/2014   Fibroadenoma of right breast 08/26/2015   GERD (gastroesophageal reflux disease)    Headache    Hematuria 02/27/2014   Hypertension    PTSD (post-traumatic stress disorder)    Screening for STD (sexually transmitted disease) 03/27/2014   Seasonal  allergies    SUI (stress urinary incontinence, female) 02/27/2014   Vaginal discharge 08/06/2015   Warts, genital 01/23/2015   Yeast infection 08/06/2015    Past Surgical History:  Procedure Laterality Date   BALLOON DILATION N/A 03/24/2022   Procedure: BALLOON DILATION;  Surgeon: Eloise Harman, DO;  [facility];  Service: Endoscopy;  Laterality: N/A;   BILATERAL SALPINGECTOMY Bilateral 05/09/2017   Procedure: BILATERAL SALPINGECTOMY;  Surgeon: Jonnie Kind, MD;  Location: [facility];  Service: Gynecology;  Laterality: Bilateral;   COLONOSCOPY N/A 09/10/2013   NAT:FTDDU;/KGURK internal hemorrhoids   COLONOSCOPY WITH PROPOFOL N/A 03/24/2022   Surgeon: Eloise Harman, DO;   Hemorrhoids on perianal exam, internal hemorrhoids, otherwise normal exam.  Repeat in 10 years.   ESOPHAGOGASTRODUODENOSCOPY N/A 09/10/2013   YHC:WCBJSEGB ring at the gastroesophagral juctions/mild non-erosive gastritis   ESOPHAGOGASTRODUODENOSCOPY  2017   Dr. Britta Mccreedy; Schatzki's ring in the distal esophagus, otherwise normal exam.   ESOPHAGOGASTRODUODENOSCOPY (EGD) WITH PROPOFOL N/A 03/24/2022   Surgeon: Eloise Harman, DO;   Mild Schatzki's ring dilated, 2 gastric polyps removed, otherwise normal exam. Polyps were hyperplastic.   LABIOPLASTY  02/14/2012   Procedure: LABIAPLASTY;  Surgeon: Jonnie Kind, MD;  Location: [facility];  Service: Gynecology;  Laterality: Right;  of the right labia minora   POLYPECTOMY  03/24/2022   Procedure: POLYPECTOMY;  Surgeon: Eloise Harman, DO;  [facility];  Service: Endoscopy;;   SUPRACERVICAL ABDOMINAL HYSTERECTOMY N/A 05/09/2017   Procedure: HYSTERECTOMY SUPRACERVICAL ABDOMINAL;  Surgeon: Jonnie Kind, MD;  Location: [facility];  Service: Gynecology;  Laterality: N/A;   WISDOM TOOTH EXTRACTION  10/18/1995   Dr. Owens Shark    Current Outpatient Medications  Medication Sig Dispense Refill   ACCU-CHEK GUIDE test strip      Accu-Chek Softclix Lancets  lancets SMARTSIG:Topical     acetaminophen (TYLENOL) 500 MG tablet Take 1,000 mg by mouth every 6 (six) hours as needed for moderate pain.     albuterol (VENTOLIN HFA) 108 (90 Base) MCG/ACT inhaler Inhale 2 puffs into the lungs every 6 (six) hours as needed for shortness of breath or wheezing.     ALPRAZolam (XANAX) 1 MG tablet Take 1 mg by mouth 3 (three) times daily.     baclofen (LIORESAL) 10 MG tablet Take 1/2-1 pill up to 3 times a day as needed for muscle spasm 90 each 5   Betamethasone Valerate 0.12 % foam as needed (bumps on head).     cetirizine (ZYRTEC) 10 MG tablet Take 10 mg by mouth daily.     Diclofenac Potassium,Migraine, (CAMBIA) 50 MG PACK Take 50-100 mg as needed for migraine. Max dose 100 mg in 24 hours. 15 each 6   Eptinezumab-jjmr (VYEPTI) 100 MG/ML injection Inject 100 mg into the vein. 4  times a year     esomeprazole (NEXIUM) 40 MG capsule Take 40 mg by mouth 2 (two) times daily.      gabapentin (NEURONTIN) 800 MG tablet Take 400 mg by mouth 3 (three) times daily.     gentamicin (GARAMYCIN) 0.3 % ophthalmic solution Place 1 drop into both eyes every 4 (four) hours.     hydrochlorothiazide (HYDRODIURIL) 25 MG tablet Take 25 mg by mouth daily.     ibuprofen (ADVIL) 600 MG tablet Take 600 mg by mouth 3 (three) times daily.     ketoconazole (NIZORAL) 2 % shampoo Apply 1 application. topically daily as needed (scalp irritation).     ketorolac (TORADOL) 10 MG tablet Take 10 mg by mouth as needed (pain).     labetalol (NORMODYNE) 100 MG tablet Take 50 mg by mouth 2 (two) times daily.     naratriptan (AMERGE) 2.5 MG tablet Take 1 tablet (2.5 mg total) by mouth as needed for migraine. Take one (1) tablet at onset of headache; if returns or does not resolve, may repeat after 4 hours; do not exceed five (5) mg in 24 hours. 12 tablet 6   nystatin ointment (MYCOSTATIN) Apply 1 application. topically daily as needed (irritation under breasts).     ondansetron (ZOFRAN) 8 MG tablet Take 8  mg by mouth every 8 (eight) hours as needed for nausea.     Polyethyl Glycol-Propyl Glycol (SYSTANE OP) Place 1 drop into both eyes daily as needed (dry eyes).     PROCTO-MED HC 2.5 % rectal cream APPLY 1 GRAM RECTALLY TWICE DAILY 30 g 1   SYMBICORT 160-4.5 MCG/ACT inhaler Inhale 2 puffs into the lungs 2 (two) times daily.     tirzepatide [facility]) 2.5 MG/0.5ML Pen Inject 2.5 mg into the skin once a week.     triamcinolone cream (KENALOG) 0.1 % Apply 1 Application topically 2 (two) times daily. 80 g 0   No current facility-administered medications for this visit.   Allergies:  Effexor [venlafaxine], Latex, Codeine, Shellfish allergy, Sulfa antibiotics, Amoxicillin, and Ceftin [cefuroxime]   Social History: The patient  reports that she has never smoked. She has never used smokeless tobacco. She reports that she does not drink  alcohol and does not use drugs.   Family History: The patient's family history includes Allergies in her sister; Asthma in her sister; Cancer in her maternal grandfather; Diabetes in her maternal grandmother; Lung cancer in her mother; Mental illness in her maternal aunt.   ROS:  Please see the history of present illness. Otherwise, complete review of systems is positive for none.  All other systems are reviewed and negative.   Physical Exam: VS:  BP (!) 132/90 (BP Location: Right Arm, Cuff Size: Large)   Pulse 88   Ht '5\' 1"'$  (1.549 m)   Wt 236 lb 6.4 oz (107.2 kg)   LMP 04/21/2017 Comment: [place]  SpO2 98%   BMI 44.67 kg/m , BMI Body mass index is 44.67 kg/m.  Wt Readings from Last 3 Encounters:  08/18/22 236 lb 6.4 oz (107.2 kg)  08/15/22 241 lb (109.3 kg)  07/28/22 241 lb 6.4 oz (109.5 kg)    General: Patient appears comfortable at rest. HEENT: Conjunctiva and lids normal, oropharynx clear with moist mucosa. Neck: Supple, no elevated JVP or carotid bruits, no thyromegaly. Lungs: Clear to auscultation, nonlabored breathing at rest. Cardiac: Regular rate and  rhythm, no S3 or significant systolic murmur, no pericardial rub. Abdomen: Soft, nontender, no hepatomegaly, bowel sounds present, no guarding or rebound. Extremities: No pitting edema, distal pulses 2+. Skin: Warm and dry. Musculoskeletal: No kyphosis. Neuropsychiatric: Alert and oriented x3, affect grossly appropriate.  ECG:  An ECG dated 08/18/2022 was personally reviewed today and demonstrated:  NSR  Recent Labwork: 04/22/2022: BUN 11; Creatinine, Ser 0.83; Hemoglobin 13.1; Platelets 518; Potassium 3.3; Sodium 137  No results found for: "CHOL", "TRIG", "HDL", "CHOLHDL", "VLDL", "LDLCALC", "LDLDIRECT"  Other Studies Reviewed Today: None  Assessment and Plan: Patient is a 46 year old F known to have HLD was referred to cardiology clinic for evaluation of SOB at the request of [place], PA-C.  #Screening for CAD Plan -Patient's grandmother of 43 years old recently had CABG and patient is extremely worried if she has any blockages in her heart vessels.  Obtain treadmill exercise EKG.  #HLD, unknown values Plan -Obtain lipid panel today. Patient had taken rosuvastatin 5 mg once daily in the past with myalgias. Instructed patient to take rosuvastatin 5 mg every other day and to hold if any myalgias recur.  I have spent a total of 45 minutes with patient reviewing chart, EKGs, labs and examining patient as well as establishing an assessment and plan that was discussed with the patient.  > 50% of time was spent in direct patient care.      Medication Adjustments/Labs and Tests Ordered: Current medicines are reviewed at length with the patient today.  Concerns regarding medicines are outlined above.   Tests Ordered: Orders Placed This Encounter  Procedures   Lipid panel   EXERCISE TOLERANCE TEST (ETT)   EKG 12-Lead    Medication Changes: No orders of the defined types were placed in this encounter.   Disposition:  Follow up  1 month  Signed Eric Morganti Fidel Levy,  MD, 08/18/2022 8:15 PM    [facility] at [place], [place],  [address]

## 2022-08-18 NOTE — Patient Instructions (Addendum)
Medication Instructions:  Your physician recommends that you continue on your current medications as directed. Please refer to the Current Medication list given to you today.  Labwork: Lipid Panel-[facility] Non-fasting  Testing/Procedures: Your physician has requested that you have an exercise tolerance test. For further information please visit [email or web address]. Please also follow instruction sheet, as given.  Follow-Up: Your physician recommends that you schedule a follow-up appointment in: 1 month  Any Other Special Instructions Will Be Listed Below (If Applicable).  If you need a refill on your cardiac medications before your next appointment, please call your pharmacy.

## 2022-08-22 ENCOUNTER — Telehealth: Payer: Self-pay | Admitting: *Deleted

## 2022-08-22 NOTE — Telephone Encounter (Signed)
Patient informed and says she did retry rosuvastatin 5 mg every other day and experienced muscle pain after 1st dose and stopped it. Copy sent to PCP

## 2022-08-22 NOTE — Telephone Encounter (Signed)
-----   Message from Chalmers Guest, MD sent at 08/22/2022  1:24 PM EST ----- LDL is high at 202. Patient is already on Rosuvastatin low dose. Instructed to take Rosuvastatin every other day during clinic visit and please ensure compliance.

## 2022-08-24 MED ORDER — ATORVASTATIN CALCIUM 10 MG PO TABS: 10.0000 mg | ORAL_TABLET | Freq: Every day | ORAL | 1 refills | Status: DC

## 2022-08-24 NOTE — Telephone Encounter (Signed)
Patient informed and verbalized understanding of plan. 

## 2022-08-25 ENCOUNTER — Ambulatory Visit (HOSPITAL_COMMUNITY)
Admission: RE | Admit: 2022-08-25 | Discharge: 2022-08-25 | Disposition: A | Discharge status: Home / Self Care | Payer: Medicaid Other | Source: Ambulatory Visit | Attending: Internal Medicine | Admitting: Internal Medicine

## 2022-08-25 DIAGNOSIS — R0609 Other forms of dyspnea: Secondary | ICD-10-CM | POA: Insufficient documentation

## 2022-08-25 LAB — EXERCISE TOLERANCE TEST
Angina Index: 0
Estimated workload: 5.3
Exercise duration (min): 3 min
Exercise duration (sec): 29 s
MPHR: 176 {beats}/min
Peak HR: 162 {beats}/min
Percent HR: 92 %
RPE: 17
Rest HR: 87 {beats}/min

## 2022-08-28 ENCOUNTER — Other Ambulatory Visit: Payer: Self-pay | Admitting: Psychiatry

## 2022-09-01 ENCOUNTER — Ambulatory Visit: Payer: Medicaid Other | Admitting: Gastroenterology

## 2022-09-13 ENCOUNTER — Other Ambulatory Visit: Payer: Self-pay | Admitting: Gastroenterology

## 2022-09-13 ENCOUNTER — Telehealth: Payer: Self-pay | Admitting: *Deleted

## 2022-09-13 MED ORDER — LINACLOTIDE 145 MCG PO CAPS: 145.0000 ug | ORAL_CAPSULE | Freq: Every day | ORAL | 2 refills | Status: DC

## 2022-09-13 NOTE — Telephone Encounter (Signed)
Spoke to pt,informed her to call and let me know if she can't get Linzess and we would try something else.

## 2022-09-13 NOTE — Telephone Encounter (Signed)
FYI routing to you in absence of [place], [place]. Pt called and states she is out of the samples of Linzess 61mg. She would like a prescription for Linzess 1454m sent to the pharmacy, due to the fact that she has started Ozempic and has more constipation.

## 2022-09-15 ENCOUNTER — Ambulatory Visit: Payer: Medicaid Other | Admitting: Gastroenterology

## 2022-09-16 ENCOUNTER — Ambulatory Visit: Payer: Medicaid Other | Admitting: Internal Medicine

## 2022-09-20 ENCOUNTER — Telehealth: Payer: Self-pay | Admitting: Pharmacy Technician

## 2022-09-20 NOTE — Telephone Encounter (Signed)
Dr. Billey Gosling,  Please sign re-enrollment forms for Vyepti PAP (Free drug). Forms has been faxed to [phone] Please return to me as soon as possible.  Thanks Maudie Mercury

## 2022-09-22 ENCOUNTER — Ambulatory Visit: Payer: Medicaid Other | Admitting: Internal Medicine

## 2022-09-22 NOTE — Telephone Encounter (Signed)
Thanks, I have received it.

## 2022-09-22 NOTE — Telephone Encounter (Signed)
Form signed and faxed back to [phone], confirmation received.

## 2022-09-22 NOTE — Telephone Encounter (Signed)
Form received, placed on MD desk for signature

## 2022-09-28 ENCOUNTER — Other Ambulatory Visit: Payer: Self-pay | Admitting: Psychiatry

## 2022-09-28 ENCOUNTER — Ambulatory Visit: Payer: Medicaid Other | Attending: Internal Medicine | Admitting: Internal Medicine

## 2022-09-28 ENCOUNTER — Encounter: Payer: Self-pay | Admitting: Internal Medicine

## 2022-09-28 VITALS — BP 132/80 | HR 92 | Ht 61.0 in | Wt 238.0 lb

## 2022-09-28 DIAGNOSIS — Z136 Encounter for screening for cardiovascular disorders: Secondary | ICD-10-CM | POA: Diagnosis not present

## 2022-09-28 NOTE — Patient Instructions (Addendum)
Medication Instructions:  Your physician has recommended you make the following change in your medication:  Stop rosuvastatin Continue other medications the same  Labwork: none  Testing/Procedures: none  Follow-Up: Your physician recommends that you schedule a follow-up appointment in: as needed  Any Other Special Instructions Will Be Listed Below (If Applicable).  If you need a refill on your cardiac medications before your next appointment, please call your pharmacy.

## 2022-09-28 NOTE — Progress Notes (Unsigned)
Cardiology Office Note  Date: 09/28/2022   ID: Cindy Stevenson, DOB Jun 03, 1976, MRN [id number]  PCP:  Rosalee Kaufman, PA-C  Cardiologist:  Chalmers Guest, MD Electrophysiologist:  None   Reason for Office Visit: Evaluation of SOB at the request of Cindy Glance, PA-C   History of Present Illness: Cindy Stevenson is a 46 y.o. female known to have HLD was referred to cardiology clinic for evaluation of SOB at the request of [place], PA-C.  Patient stated that she gets short of breath and experiences palpitations whenever she exerts herself. She even had to go to the ER a few months ago for the similar symptoms. CT PE was performed which showed no evidence of PE and she was discharged to follow-up with PCP/cardiology for stress test. Otherwise patient denied any chest pain/angina, dizziness/lightheadedness, syncope, LE swelling. Patient is not physically active at baseline and stated she gets short of breath with household chores sometimes. Denied any orthopnea/PND or LE swelling. Denies smoking cigarettes, alcohol use or illicit drug abuse.  Patient's grandmother of 84 years old had CABG recently and patient is extremely worried if she also has blockages in her heart vessels.  Due to distress, she feels SOB.  She had taken rosuvastatin 5 mg in the past for HLD but had to discontinue it due to myalgias. I do not have a lipid panel report to review.  Past Medical History:  Diagnosis Date   Anxiety    Breast nodule 08/06/2015   Breast pain, left 08/06/2015   Burning with urination 02/27/2014   Common migraine with intractable migraine 06/11/2018   Depression    Diabetes mellitus without complication ([place])    Dysmenorrhea 02/27/2014   Fibroadenoma of right breast 08/26/2015   GERD (gastroesophageal reflux disease)    Headache    Hematuria 02/27/2014   Hypertension    PTSD (post-traumatic stress disorder)    Screening for STD (sexually transmitted disease) 03/27/2014   Seasonal  allergies    SUI (stress urinary incontinence, female) 02/27/2014   Vaginal discharge 08/06/2015   Warts, genital 01/23/2015   Yeast infection 08/06/2015    Past Surgical History:  Procedure Laterality Date   BALLOON DILATION N/A 03/24/2022   Procedure: BALLOON DILATION;  Surgeon: Eloise Harman, DO;  [facility];  Service: Endoscopy;  Laterality: N/A;   BILATERAL SALPINGECTOMY Bilateral 05/09/2017   Procedure: BILATERAL SALPINGECTOMY;  Surgeon: Jonnie Kind, MD;  Location: [facility];  Service: Gynecology;  Laterality: Bilateral;   COLONOSCOPY N/A 09/10/2013   BOF:BPZWC;/HENID internal hemorrhoids   COLONOSCOPY WITH PROPOFOL N/A 03/24/2022   Surgeon: Eloise Harman, DO;   Hemorrhoids on perianal exam, internal hemorrhoids, otherwise normal exam.  Repeat in 10 years.   ESOPHAGOGASTRODUODENOSCOPY N/A 09/10/2013   POE:UMPNTIRW ring at the gastroesophagral juctions/mild non-erosive gastritis   ESOPHAGOGASTRODUODENOSCOPY  2017   Dr. Britta Mccreedy; Schatzki's ring in the distal esophagus, otherwise normal exam.   ESOPHAGOGASTRODUODENOSCOPY (EGD) WITH PROPOFOL N/A 03/24/2022   Surgeon: Eloise Harman, DO;   Mild Schatzki's ring dilated, 2 gastric polyps removed, otherwise normal exam. Polyps were hyperplastic.   LABIOPLASTY  02/14/2012   Procedure: LABIAPLASTY;  Surgeon: Jonnie Kind, MD;  Location: [facility];  Service: Gynecology;  Laterality: Right;  of the right labia minora   POLYPECTOMY  03/24/2022   Procedure: POLYPECTOMY;  Surgeon: Eloise Harman, DO;  [facility];  Service: Endoscopy;;   SUPRACERVICAL ABDOMINAL HYSTERECTOMY N/A 05/09/2017   Procedure: HYSTERECTOMY SUPRACERVICAL ABDOMINAL;  Surgeon: Jonnie Kind, MD;  Location: [facility];  Service: Gynecology;  Laterality: N/A;   WISDOM TOOTH EXTRACTION  10/18/1995   Dr. Owens Shark    Current Outpatient Medications  Medication Sig Dispense Refill   ACCU-CHEK GUIDE test strip      Accu-Chek Softclix Lancets  lancets SMARTSIG:Topical     acetaminophen (TYLENOL) 500 MG tablet Take 1,000 mg by mouth every 6 (six) hours as needed for moderate pain.     albuterol (VENTOLIN HFA) 108 (90 Base) MCG/ACT inhaler Inhale 2 puffs into the lungs every 6 (six) hours as needed for shortness of breath or wheezing.     ALPRAZolam (XANAX) 1 MG tablet Take 1 mg by mouth 3 (three) times daily.     atorvastatin (LIPITOR) 10 MG tablet Take 1 tablet (10 mg total) by mouth daily. 90 tablet 1   baclofen (LIORESAL) 10 MG tablet Take 1/2-1 pill up to 3 times a day as needed for muscle spasm 90 each 5   Betamethasone Valerate 0.12 % foam as needed (bumps on head).     cetirizine (ZYRTEC) 10 MG tablet Take 10 mg by mouth daily.     Diclofenac Potassium,Migraine, (CAMBIA) 50 MG PACK Take 50-100 mg as needed for migraine. Max dose 100 mg in 24 hours. 15 each 6   Eptinezumab-jjmr (VYEPTI) 100 MG/ML injection Inject 100 mg into the vein. 4  times a year     esomeprazole (NEXIUM) 40 MG capsule Take 40 mg by mouth 2 (two) times daily.      gabapentin (NEURONTIN) 800 MG tablet Take 400 mg by mouth 3 (three) times daily.     hydrochlorothiazide (HYDRODIURIL) 25 MG tablet Take 25 mg by mouth daily.     ibuprofen (ADVIL) 600 MG tablet Take 600 mg by mouth 3 (three) times daily.     ketoconazole (NIZORAL) 2 % shampoo Apply 1 application. topically daily as needed (scalp irritation).     ketorolac (TORADOL) 10 MG tablet Take 10 mg by mouth as needed (pain).     labetalol (NORMODYNE) 100 MG tablet Take 50 mg by mouth 2 (two) times daily.     linaclotide (LINZESS) 145 MCG CAPS capsule Take 1 capsule (145 mcg total) by mouth daily before breakfast. 30 capsule 2   naratriptan (AMERGE) 2.5 MG tablet Take 1 tablet (2.5 mg total) by mouth as needed for migraine. Take one (1) tablet at onset of headache; if returns or does not resolve, may repeat after 4 hours; do not exceed five (5) mg in 24 hours. 12 tablet 6   nystatin ointment (MYCOSTATIN) Apply  1 application. topically daily as needed (irritation under breasts).     ondansetron (ZOFRAN) 8 MG tablet Take 8 mg by mouth every 8 (eight) hours as needed for nausea.     Polyethyl Glycol-Propyl Glycol (SYSTANE OP) Place 1 drop into both eyes daily as needed (dry eyes).     PROCTO-MED HC 2.5 % rectal cream APPLY 1 GRAM RECTALLY TWICE DAILY 30 g 1   SYMBICORT 160-4.5 MCG/ACT inhaler Inhale 2 puffs into the lungs 2 (two) times daily.     tirzepatide [facility]) 2.5 MG/0.5ML Pen Inject 2.5 mg into the skin once a week.     triamcinolone cream (KENALOG) 0.1 % Apply 1 Application topically 2 (two) times daily. 80 g 0   No current facility-administered medications for this visit.   Allergies:  Effexor [venlafaxine], Latex, Codeine, Shellfish allergy, Sulfa antibiotics, Amoxicillin, and Ceftin [cefuroxime]   Social History: The  patient  reports that she has never smoked. She has never used smokeless tobacco. She reports that she does not drink alcohol and does not use drugs.   Family History: The patient's family history includes Allergies in her sister; Asthma in her sister; Cancer in her maternal grandfather; Diabetes in her maternal grandmother; Lung cancer in her mother; Mental illness in her maternal aunt.   ROS:  Please see the history of present illness. Otherwise, complete review of systems is positive for none.  All other systems are reviewed and negative.   Physical Exam: VS:  LMP 04/21/2017 Comment: [place], BMI There is no height or weight on file to calculate BMI.  Wt Readings from Last 3 Encounters:  08/18/22 236 lb 6.4 oz (107.2 kg)  08/15/22 241 lb (109.3 kg)  07/28/22 241 lb 6.4 oz (109.5 kg)    General: Patient appears comfortable at rest. HEENT: Conjunctiva and lids normal, oropharynx clear with moist mucosa. Neck: Supple, no elevated JVP or carotid bruits, no thyromegaly. Lungs: Clear to auscultation, nonlabored breathing at rest. Cardiac: Regular rate and rhythm, no S3 or  significant systolic murmur, no pericardial rub. Abdomen: Soft, nontender, no hepatomegaly, bowel sounds present, no guarding or rebound. Extremities: No pitting edema, distal pulses 2+. Skin: Warm and dry. Musculoskeletal: No kyphosis. Neuropsychiatric: Alert and oriented x3, affect grossly appropriate.  ECG:  An ECG dated 08/18/2022 was personally reviewed today and demonstrated:  NSR  Recent Labwork: 04/22/2022: BUN 11; Creatinine, Ser 0.83; Hemoglobin 13.1; Platelets 518; Potassium 3.3; Sodium 137  No results found for: "CHOL", "TRIG", "HDL", "CHOLHDL", "VLDL", "LDLCALC", "LDLDIRECT"  Other Studies Reviewed Today: None  Assessment and Plan: Patient is a 46 year old F known to have HLD was referred to cardiology clinic for evaluation of SOB at the request of [place], PA-C.  #Screening for CAD Plan -Patient's grandmother of 88 years old recently had CABG and patient is extremely worried if she has any blockages in her heart vessels.  Obtain treadmill exercise EKG.  #HLD, unknown values Plan -Obtain lipid panel today. Patient had taken rosuvastatin 5 mg once daily in the past with myalgias. Instructed patient to take rosuvastatin 5 mg every other day and to hold if any myalgias recur.  I have spent a total of 45 minutes with patient reviewing chart, EKGs, labs and examining patient as well as establishing an assessment and plan that was discussed with the patient.  > 50% of time was spent in direct patient care.      Medication Adjustments/Labs and Tests Ordered: Current medicines are reviewed at length with the patient today.  Concerns regarding medicines are outlined above.   Tests Ordered: No orders of the defined types were placed in this encounter.   Medication Changes: No orders of the defined types were placed in this encounter.   Disposition:  Follow up  1 month  Signed Anisia Leija Fidel Levy, MD, 09/28/2022 3:41 PM    [facility] at  [place], [place], [place] [address]

## 2022-09-29 ENCOUNTER — Ambulatory Visit (INDEPENDENT_AMBULATORY_CARE_PROVIDER_SITE_OTHER): Payer: Medicaid Other | Admitting: Gastroenterology

## 2022-09-29 ENCOUNTER — Encounter: Payer: Self-pay | Admitting: Gastroenterology

## 2022-09-29 VITALS — BP 122/82 | HR 103 | Temp 97.8°F | Ht 61.0 in | Wt 236.0 lb

## 2022-09-29 DIAGNOSIS — K641 Second degree hemorrhoids: Secondary | ICD-10-CM | POA: Diagnosis not present

## 2022-09-29 NOTE — Patient Instructions (Signed)
  Please avoid straining.  You should limit your toilet time to 2-3 minutes at the most.   I recommend Benefiber 2 teaspoons each morning in the beverage of your choice!  Please call me with any concerns or issues!  I will see you in follow-up for additional banding in several weeks.   Have a great Christmas!  It was a pleasure to see you today. I want to create trusting relationships with patients to provide genuine, compassionate, and quality care. I value your feedback. If you receive a survey regarding your visit,  I greatly appreciate you taking time to fill this out.   Annitta Needs, PhD, ANP-BC [facility]

## 2022-09-29 NOTE — Progress Notes (Signed)
      [place] BANDING PROCEDURE NOTE  Cindy Stevenson is a 46 y.o. female presenting today for consideration of hemorrhoid banding. Last colonoscopy 2023 with internal hemorrhoids. Notes bleeding, itching, pressure.   The patient presents with symptomatic grade 2 hemorrhoids, unresponsive to maximal medical therapy, requesting rubber band ligation of her hemorrhoidal disease. All risks, benefits, and alternative forms of therapy were described and informed consent was obtained.   The decision was made to band the left lateral internal hemorrhoid using latex-free bands, and the [place] was used to perform band ligation without complication. Digital anorectal examination was then performed to assure proper positioning of the band, and to adjust the banded tissue as required. The patient was discharged home without pain or other issues. Dietary and behavioral recommendations were given, along with follow-up instructions. The patient will return in several weeks for followup and possible additional banding as required.  No complications were encountered and the patient tolerated the procedure well.   Annitta Needs, PhD, ANP-BC [facility]

## 2022-10-13 ENCOUNTER — Ambulatory Visit: Payer: Medicaid Other | Admitting: Psychiatry

## 2022-10-13 ENCOUNTER — Ambulatory Visit: Payer: Medicaid Other | Admitting: Cardiology

## 2022-10-15 ENCOUNTER — Other Ambulatory Visit: Payer: Self-pay | Admitting: Psychiatry

## 2022-10-18 ENCOUNTER — Other Ambulatory Visit: Payer: Self-pay | Admitting: Psychiatry

## 2022-10-18 ENCOUNTER — Telehealth: Payer: Self-pay | Admitting: Psychiatry

## 2022-10-18 NOTE — Telephone Encounter (Signed)
LVM and sent mychart msg informing pt of r/s needed for 3/20 appt- MD out.

## 2022-10-19 NOTE — Telephone Encounter (Signed)
Ok, I got the order signed and faxed back to [phone], Got confirmation. Please let me know you have received.! Thanks!!!

## 2022-10-19 NOTE — Telephone Encounter (Signed)
Hi Kim, we received a fax stating Please have MD sign forms and return to me as soon as possible. 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 The forms  that were attached, MD signed. Did we not complete everything was needed for this patient ?  Thanks!

## 2022-10-19 NOTE — Telephone Encounter (Signed)
New PA submitted via CMM Key: [id number] Your information has been sent to [facility].

## 2022-10-19 NOTE — Telephone Encounter (Signed)
Got denial back, previous PA was approved through 08/01/2023. Contacted insurance and they stated a new PA shouldn't be needed. [place] attempted to call pharmacy, I was on call for 49 mins, Salome Arnt was unable to contact pharmacy but will note new PA isnt needed. I contacted pharmacy and spoke to [facility], she attempted to rerun claim and still got msg PA was needed. Advised to run 15 /30days .Marland Kitchen They had previously been giving 3 / 9days. Estill Bamberg will have her techs reach out to patient and see if there has been a change in insurance, although I was told she was still active and go from there.

## 2022-10-20 ENCOUNTER — Other Ambulatory Visit: Payer: Self-pay

## 2022-10-21 ENCOUNTER — Encounter: Payer: Self-pay | Admitting: Psychiatry

## 2022-10-24 ENCOUNTER — Encounter: Payer: Self-pay | Admitting: Psychiatry

## 2022-10-24 NOTE — Telephone Encounter (Signed)
Addressed in [facility] 10/21/22

## 2022-10-25 MED ORDER — KETOROLAC TROMETHAMINE 10 MG PO TABS: 10.0000 mg | ORAL_TABLET | Freq: Three times a day (TID) | ORAL | 6 refills | Status: DC | PRN

## 2022-10-25 NOTE — Telephone Encounter (Signed)
Yes we can switch the appointments so she can have the nerve block sooner. I sent a new rx for Toradol for her

## 2022-10-25 NOTE — Addendum Note (Signed)
Addended by: Genia Harold on: 10/25/2022 09:05 AM   Modules accepted: Orders

## 2022-10-25 NOTE — Telephone Encounter (Addendum)
Looks like Toradol was d/c, do you want her to continue it?  She also got rescheduled for her nerve block because you will be out in March, she got rescheduled to July. She already had a office visit scheduled 4/2, can I concert that one for the injection and the July appt to regular OV ?

## 2022-10-28 ENCOUNTER — Ambulatory Visit (INDEPENDENT_AMBULATORY_CARE_PROVIDER_SITE_OTHER): Payer: Medicaid Other

## 2022-10-28 VITALS — BP 114/80 | HR 88 | Temp 98.2°F | Resp 18 | Ht 61.0 in | Wt 232.2 lb

## 2022-10-28 DIAGNOSIS — G43019 Migraine without aura, intractable, without status migrainosus: Secondary | ICD-10-CM

## 2022-10-28 MED ORDER — SODIUM CHLORIDE 0.9 % IV SOLN
300.0000 mg | Freq: Once | INTRAVENOUS | Status: AC
  Administered 2022-10-28: 300 mg via INTRAVENOUS
  Filled 2022-10-28: qty 3

## 2022-10-28 NOTE — Progress Notes (Signed)
Diagnosis: Migraines  Provider:  Marshell Garfinkel MD  Procedure: Infusion  IV Type: Peripheral, IV Location: R Antecubital  Vyepti (Eptinezumab-jjmr), Dose: 300 mg  Infusion Start Time: 7373  Infusion Stop Time: 6681  Post Infusion IV Care: Peripheral IV Discontinued  Discharge: Condition: Good, Destination: Home . AVS provided to patient.   Performed by:  Koren Shiver, RN

## 2022-11-01 ENCOUNTER — Ambulatory Visit: Payer: Medicaid Other | Admitting: Psychiatry

## 2022-11-03 ENCOUNTER — Encounter: Payer: Medicaid Other | Admitting: Gastroenterology

## 2022-11-04 ENCOUNTER — Other Ambulatory Visit: Payer: Self-pay | Admitting: Psychiatry

## 2022-11-10 ENCOUNTER — Encounter: Payer: Medicaid Other | Admitting: Gastroenterology

## 2022-11-16 ENCOUNTER — Ambulatory Visit (INDEPENDENT_AMBULATORY_CARE_PROVIDER_SITE_OTHER): Payer: Medicaid Other | Admitting: Psychiatry

## 2022-11-16 VITALS — BP 139/89 | HR 104 | Ht 61.0 in | Wt 230.8 lb

## 2022-11-16 DIAGNOSIS — M542 Cervicalgia: Secondary | ICD-10-CM | POA: Diagnosis not present

## 2022-11-16 DIAGNOSIS — M5481 Occipital neuralgia: Secondary | ICD-10-CM

## 2022-11-16 NOTE — Progress Notes (Signed)
Procedure: Occipital Nerve injection/Trigger point injection  Location: bilateral occiput  The risks, benefits and anticipated outcomes of the procedure, the risks and benefits of the alternatives to the procedure, and the roles and tasks of the personnel to be involved, were discussed with the patient, and the patient consents to the procedure and agrees to proceed.     A combination of 1 cc betamethasone 6 mg and 4 cc of 0.25% bupivacaine were prepared in 1 syringe (5 cc).  2 trigger points on the splenius capitus were identified and injected. The left and right greater occipital nerves were injected 3cm caudal and 1.5 cm lateral to the inion where the main trunk of the occipital nerve penetrates the semispinalis muscle.  The needle was placed perpendicular and the needle advanced 1.5 cm. After aspiration to ensure no obstruction or presence of blood, the area was injected.  The needle was repositioned in a fan-like manner and the entire area was injected. Pressure was held and no hematoma was noted.   Genia Harold, MD 11/16/22 3:54 PM

## 2022-11-28 ENCOUNTER — Other Ambulatory Visit: Payer: Self-pay | Admitting: Gastroenterology

## 2022-12-01 ENCOUNTER — Ambulatory Visit (INDEPENDENT_AMBULATORY_CARE_PROVIDER_SITE_OTHER): Payer: Medicaid Other | Admitting: Gastroenterology

## 2022-12-01 ENCOUNTER — Encounter: Payer: Self-pay | Admitting: Gastroenterology

## 2022-12-01 VITALS — BP 123/86 | HR 90 | Temp 98.2°F | Ht 61.0 in | Wt 234.2 lb

## 2022-12-01 DIAGNOSIS — K642 Third degree hemorrhoids: Secondary | ICD-10-CM

## 2022-12-01 NOTE — Patient Instructions (Signed)
  Please avoid straining.  You should limit your toilet time to 2-3 minutes at the most.   I recommend Benefiber 2 teaspoons each morning in the beverage of your choice!  Please call me with any concerns or issues!  I will see you in follow-up for additional banding in several weeks.  Let's stop Linzess. Let's try Trulance once daily with or without food. Let me know how this works for you!!  I enjoyed seeing you again today! At our first visit, I mentioned how I value our relationship and want to provide genuine, compassionate, and quality care. You may receive a survey regarding your visit with me, and I welcome your feedback! Thanks so much for taking the time to complete this. I look forward to seeing you again.   Annitta Needs, PhD, ANP-BC [facility]

## 2022-12-01 NOTE — Progress Notes (Signed)
    [place] BANDING PROCEDURE NOTE  Cindy Stevenson is a 47 y.o. female presenting today for consideration of hemorrhoid banding. Last colonoscopy 2023 with internal hemorrhoids. Has had bleeding, itching, pressure in the past. Thus far has had left lateral banding. Linzess 145 mcg causing watery stool at times. Sometimes straining.    The patient presents with symptomatic grade 3 hemorrhoids, unresponsive to maximal medical therapy, requesting rubber band ligation of her hemorrhoidal disease. All risks, benefits, and alternative forms of therapy were described and informed consent was obtained.   The decision was made to band the right posterior internal hemorrhoid using latex-free bands, and the [place] was used to perform band ligation without complication. Digital anorectal examination was then performed to assure proper positioning of the band, and to adjust the banded tissue as required. The patient was discharged home without pain or other issues. Dietary and behavioral recommendations were given, along with follow-up instructions. The patient will return in several weeks for followup and possible additional banding as required. Will stop Linzess and trial Trulance. May need to consider Amitiza.   No complications were encountered and the patient tolerated the procedure well.   Annitta Needs, PhD, ANP-BC [facility]

## 2022-12-06 ENCOUNTER — Other Ambulatory Visit: Payer: Self-pay | Admitting: Psychiatry

## 2022-12-08 ENCOUNTER — Telehealth: Payer: Self-pay

## 2022-12-08 MED ORDER — LUBIPROSTONE 24 MCG PO CAPS: 24.0000 ug | ORAL_CAPSULE | Freq: Two times a day (BID) | ORAL | 3 refills | Status: DC

## 2022-12-08 NOTE — Telephone Encounter (Signed)
Documentation from [facility] approved . It is listed on the drug formulary preferred drug list. Pt advised

## 2022-12-08 NOTE — Telephone Encounter (Signed)
PA done for the pt's Lubiprostone 24 mcg cap. Pt has tried/failed: Linzess 145 mcg, and Trulance 3 mg. Dx used: K59.03 and K59.04. waiting on a response from Cover My Meds

## 2022-12-19 ENCOUNTER — Other Ambulatory Visit: Payer: Self-pay

## 2022-12-19 ENCOUNTER — Encounter: Payer: Self-pay | Admitting: Psychiatry

## 2022-12-19 DIAGNOSIS — M542 Cervicalgia: Secondary | ICD-10-CM

## 2022-12-20 ENCOUNTER — Other Ambulatory Visit: Payer: Self-pay | Admitting: Psychiatry

## 2022-12-20 ENCOUNTER — Telehealth: Payer: Self-pay | Admitting: Psychiatry

## 2022-12-20 DIAGNOSIS — G43719 Chronic migraine without aura, intractable, without status migrainosus: Secondary | ICD-10-CM

## 2022-12-20 NOTE — Telephone Encounter (Signed)
I can still see her as a patient, but I still want her to see [facility] at least once to get their opinion on if there's anything additional to to be done for her headaches.

## 2022-12-20 NOTE — Telephone Encounter (Signed)
Referral for Neurology fax to [facility]. Phone:L [phone], Fax: [phone].

## 2022-12-20 NOTE — Telephone Encounter (Signed)
She should try to limit her use of the powders to 2 days per week, otherwise she risks getting rebound headaches. I'm not sure why her pharmacy is only giving 9 packs but it's possible that's all her insurance would cover.  It sounds like her preventive medication is not working that well if she's still needing rescue meds every day. I think at this point she'd benefit from a referral to an academic center because I don't have any other options to offer at this time. I've placed a referral to [facility] neurology for her

## 2022-12-26 ENCOUNTER — Other Ambulatory Visit: Payer: Self-pay | Admitting: Psychiatry

## 2022-12-26 NOTE — Telephone Encounter (Signed)
Pt last seen on 11/16/22 Follow up scheduled on 01/17/23

## 2022-12-27 ENCOUNTER — Other Ambulatory Visit: Payer: Self-pay | Admitting: Psychiatry

## 2023-01-03 ENCOUNTER — Other Ambulatory Visit: Payer: Self-pay | Admitting: Psychiatry

## 2023-01-03 ENCOUNTER — Encounter: Payer: Medicaid Other | Admitting: Gastroenterology

## 2023-01-04 ENCOUNTER — Other Ambulatory Visit: Payer: Self-pay | Admitting: Psychiatry

## 2023-01-04 ENCOUNTER — Ambulatory Visit: Payer: Medicaid Other | Admitting: Psychiatry

## 2023-01-05 ENCOUNTER — Encounter: Payer: Medicaid Other | Admitting: Gastroenterology

## 2023-01-05 NOTE — Telephone Encounter (Signed)
[name]:  Please put in any appt that's available for banding. If issues finding something in an afternoon slot, please let me know.

## 2023-01-09 NOTE — Telephone Encounter (Signed)
Cindy Stevenson:  Patient would prefer to stick with me. Can you cancel the appt with Loma Sousa and put with me? Thanks!

## 2023-01-17 ENCOUNTER — Ambulatory Visit (INDEPENDENT_AMBULATORY_CARE_PROVIDER_SITE_OTHER): Payer: Medicaid Other | Admitting: Psychiatry

## 2023-01-17 ENCOUNTER — Telehealth: Payer: Self-pay | Admitting: *Deleted

## 2023-01-17 ENCOUNTER — Encounter: Payer: Self-pay | Admitting: Psychiatry

## 2023-01-17 VITALS — Ht 61.0 in | Wt 224.5 lb

## 2023-01-17 DIAGNOSIS — M542 Cervicalgia: Secondary | ICD-10-CM

## 2023-01-17 MED ORDER — BETAMETHASONE SOD PHOS & ACET 6 (3-3) MG/ML IJ SUSP
12.0000 mg | Freq: Once | INTRAMUSCULAR | Status: AC
  Administered 2023-01-17: 12 mg

## 2023-01-17 MED ORDER — DICLOFENAC POTASSIUM(MIGRAINE) 50 MG PO PACK: PACK | ORAL | 6 refills | Status: DC

## 2023-01-17 MED ORDER — BUPIVACAINE HCL (PF) 0.25 % IJ SOLN
10.0000 mL | Freq: Once | INTRAMUSCULAR | Status: AC
  Administered 2023-01-17: 10 mL

## 2023-01-17 MED ORDER — NARATRIPTAN HCL 2.5 MG PO TABS: 2.5000 mg | ORAL_TABLET | ORAL | 6 refills | Status: DC | PRN

## 2023-01-17 NOTE — Progress Notes (Signed)
Procedure: Occipital Nerve injection/Trigger point injection  Location: bilateral occiput  The risks, benefits and anticipated outcomes of the procedure, the risks and benefits of the alternatives to the procedure, and the roles and tasks of the personnel to be involved, were discussed with the patient, and the patient consents to the procedure and agrees to proceed.     A combination of 1 cc betamethasone 6 mg and 4 cc of 0.25% bupivacaine were prepared in 2 syringes (5 cc).  2 trigger points on the splenius capitus were identified and injected. The bilateral greater occipital nerves were injected 3cm caudal and 1.5 cm lateral to the inion where the main trunk of the occipital nerve penetrates the semispinalis muscle.  The needle was placed perpendicular and the needle advanced 1.5 cm. After aspiration to ensure no obstruction or presence of blood, the area was injected.  The needle was repositioned in a fan-like manner and the entire area was injected. Pressure was held and no hematoma was noted.   Prior Therapies                                  Preventive: Prozac Paxil Cymbalta Amitriptyline Topamax Gabapentin 400 mg TID Metoprolol Lisinopril losartan Ajovy Aimovig Emgality Vyepti 300 mg  Botox - worsened neck pain Occipital nerve block Neck PT   Rescue: Naratriptan 2.5 mg PRN Maxalt - lack of efficacy Imitrex - lack of efficacy Nurtec - lack of efficacy Ubrelvy - lack of efficacy Zavzpret - lack of efficacy Diclofenac  - stomach upset Baclofen  Genia Harold, MD 01/17/23 11:16 AM

## 2023-01-17 NOTE — Patient Instructions (Signed)
Try taking magnesium oxide 500 mg at bedtime to help prevent cramping and headaches

## 2023-01-17 NOTE — Telephone Encounter (Signed)
[facility] called regarding patient Diclofenac Potassium Migraine 50 pack. They asked if #18 can be given verses #15 due to package size. I sent a teams message asking if this okay with Dr.Chima and she replied that would be fine. [facility] informed okay to change.

## 2023-01-18 ENCOUNTER — Encounter: Payer: Self-pay | Admitting: Psychiatry

## 2023-01-18 ENCOUNTER — Other Ambulatory Visit: Payer: Self-pay

## 2023-01-18 MED ORDER — BACLOFEN 10 MG PO TABS: ORAL_TABLET | ORAL | 3 refills | Status: DC

## 2023-01-23 ENCOUNTER — Encounter: Payer: Medicaid Other | Admitting: Gastroenterology

## 2023-01-25 ENCOUNTER — Encounter: Payer: Self-pay | Admitting: Psychiatry

## 2023-01-27 ENCOUNTER — Ambulatory Visit (INDEPENDENT_AMBULATORY_CARE_PROVIDER_SITE_OTHER): Payer: Medicaid Other | Admitting: *Deleted

## 2023-01-27 VITALS — BP 124/86 | HR 89 | Temp 98.3°F | Resp 18 | Ht 61.0 in | Wt 227.0 lb

## 2023-01-27 DIAGNOSIS — G43019 Migraine without aura, intractable, without status migrainosus: Secondary | ICD-10-CM | POA: Diagnosis not present

## 2023-01-27 MED ORDER — SODIUM CHLORIDE 0.9 % IV SOLN
300.0000 mg | Freq: Once | INTRAVENOUS | Status: AC
  Administered 2023-01-27: 300 mg via INTRAVENOUS
  Filled 2023-01-27: qty 3

## 2023-01-27 NOTE — Progress Notes (Signed)
Diagnosis: Migraine  Provider:  Chilton Greathouse MD  Procedure: Infusion  IV Type: Peripheral, IV Location: R Hand  Vyepti (Eptinezumab-jjmr), Dose: 300 mg  Infusion Start Time: 1514 pm  Infusion Stop Time: 1543 pm  Post Infusion IV Care: Observation period completed and Peripheral IV Discontinued  Discharge: Condition: Good, Destination: Home . AVS Declined  Performed by:  Forrest Moron, RN

## 2023-01-30 ENCOUNTER — Telehealth: Payer: Self-pay | Admitting: Pharmacy Technician

## 2023-01-30 NOTE — Telephone Encounter (Addendum)
Change in site:  patient will transfer to [facility].  Patient has been approved for Free Drug 10/27/22 - 10/17/23 Phone: [phone] Fax: [phone]  Shipping info has been updated to: [facility] ATTN: Serafina Royals (pharmacy dept) Excell sheet and FYI flag has been updated Rep: Linette-C 12:57p

## 2023-01-30 NOTE — Telephone Encounter (Signed)
[name]: please arrange a visit for patient with me this week. I should have some openings. Thanks!

## 2023-02-02 ENCOUNTER — Ambulatory Visit (INDEPENDENT_AMBULATORY_CARE_PROVIDER_SITE_OTHER): Payer: Medicaid Other | Admitting: Gastroenterology

## 2023-02-02 ENCOUNTER — Encounter: Payer: Self-pay | Admitting: Gastroenterology

## 2023-02-02 VITALS — BP 128/81 | HR 97 | Temp 98.0°F | Ht 61.0 in | Wt 231.2 lb

## 2023-02-02 DIAGNOSIS — R1011 Right upper quadrant pain: Secondary | ICD-10-CM | POA: Diagnosis not present

## 2023-02-02 DIAGNOSIS — K828 Other specified diseases of gallbladder: Secondary | ICD-10-CM | POA: Diagnosis not present

## 2023-02-02 NOTE — Patient Instructions (Signed)
I am ordering an ultrasound and referring you to a surgeon.  Further recommendations to follow!  I enjoyed seeing you again today! At our first visit, I mentioned how I value our relationship and want to provide genuine, compassionate, and quality care. You may receive a survey regarding your visit with me, and I welcome your feedback! Thanks so much for taking the time to complete this. I look forward to seeing you again.   Gelene Mink, PhD, ANP-BC [facility]
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
## 2023-02-03 ENCOUNTER — Ambulatory Visit (HOSPITAL_COMMUNITY)
Admission: RE | Admit: 2023-02-03 | Discharge: 2023-02-03 | Disposition: A | Discharge status: Home / Self Care | Payer: Medicaid Other | Source: Ambulatory Visit | Attending: Gastroenterology | Admitting: Gastroenterology

## 2023-02-03 DIAGNOSIS — R1011 Right upper quadrant pain: Secondary | ICD-10-CM

## 2023-02-09 ENCOUNTER — Ambulatory Visit (INDEPENDENT_AMBULATORY_CARE_PROVIDER_SITE_OTHER): Payer: Medicaid Other | Admitting: Gastroenterology

## 2023-02-09 ENCOUNTER — Telehealth: Payer: Self-pay | Admitting: Gastroenterology

## 2023-02-09 ENCOUNTER — Encounter: Payer: Self-pay | Admitting: Gastroenterology

## 2023-02-09 VITALS — BP 133/85 | HR 99 | Temp 97.5°F | Ht 61.0 in | Wt 233.4 lb

## 2023-02-09 DIAGNOSIS — K642 Third degree hemorrhoids: Secondary | ICD-10-CM

## 2023-02-09 NOTE — Telephone Encounter (Signed)
[name]:  Patient can be seen in 2 weeks in any slot (doesn't have to be banding) thanks!  Cindy Stevenson

## 2023-02-09 NOTE — Patient Instructions (Signed)
  Please avoid straining.  You should limit your toilet time to 2-3 minutes at the most.   I recommend Benefiber 2 teaspoons each morning in the beverage of your choice!  Please call me with any concerns or issues!  I will see you in follow-up for additional banding in several weeks.        

## 2023-02-09 NOTE — Progress Notes (Signed)
    [facility] BANDING PROCEDURE NOTE  Cindy Stevenson is a 47 y.o. female presenting today for consideration of hemorrhoid banding. Last colonoscopy  2023 with internal hemorrhoids. Has had bleeding, itching, pressure in the past . She has had left lateral banding and right posterior banding.    The patient presents with symptomatic grade 3 hemorrhoids, unresponsive to maximal medical therapy, requesting rubber band ligation of her hemorrhoidal disease. All risks, benefits, and alternative forms of therapy were described and informed consent was obtained.   The decision was made to band the right anterior internal hemorrhoid using latex-free bands, and the [facility]'Regan System was used to perform band ligation without complication. Digital anorectal examination was then performed to assure proper positioning of the band, and to adjust the banded tissue as required. The patient was discharged home without pain or other issues. Dietary and behavioral recommendations were given, along with follow-up instructions. The patient will return in several weeks for followup and possible additional banding as required.  No complications were encountered and the patient tolerated the procedure well.   Gelene Mink, PhD, ANP-BC [facility]

## 2023-02-23 ENCOUNTER — Encounter: Payer: Self-pay | Admitting: Surgery

## 2023-02-23 ENCOUNTER — Ambulatory Visit (INDEPENDENT_AMBULATORY_CARE_PROVIDER_SITE_OTHER): Payer: Medicaid Other | Admitting: Surgery

## 2023-02-23 VITALS — BP 135/78 | HR 104 | Temp 98.4°F | Resp 14 | Ht 61.0 in | Wt 234.0 lb

## 2023-02-23 DIAGNOSIS — K8050 Calculus of bile duct without cholangitis or cholecystitis without obstruction: Secondary | ICD-10-CM | POA: Diagnosis not present

## 2023-02-23 DIAGNOSIS — R1011 Right upper quadrant pain: Secondary | ICD-10-CM | POA: Diagnosis not present

## 2023-02-23 NOTE — Progress Notes (Signed)
[facility] History and Physical   Reason for Referral: Right upper quadrant abdominal pain,?  Biliary dyskinesia Referring Physician: Lewie Loron, NP   Chief Complaint   New Patient (Initial Visit)        Cindy Stevenson is a 47 y.o. female.  HPI: Patient presents for evaluation of persistent right upper quadrant abdominal pain.  Starting about 5 years ago, she was having monthly episodes of diarrhea, cold sweats, and foul-smelling burping.  Recently, associated with these episodes, she has had right upper quadrant abdominal pain and pain with sleeping on her right side.  The pain is almost always present.  The pain will radiate along the right side of her abdomen and into the epigastrium.  She had a colonoscopy and EGD last summer, and the colonoscopy demonstrated some internal hemorrhoids, while the EGD demonstrated a mild Schatzki's ring and gastric polyps that were hyperplastic.  She has undergone numerous abdominal imaging studies which do not demonstrate any cholelithiasis.  In October, she underwent a HIDA scan which demonstrated normal gallbladder function.  Patient states that when she underwent the study, she had right upper quadrant abdominal pain similar to the pain that she has been having with her previous episodes.  Her past medical history significant for diabetes, hypertension, depression, PTSD, and migraines.  She denies use of blood thinning medications.  She underwent an open partial hysterectomy.  She denies use of tobacco products, alcohol, and illicit drugs.       Past Medical History:  Diagnosis Date   Anxiety     Breast nodule 08/06/2015   Breast pain, left 08/06/2015   Burning with urination 02/27/2014   Common migraine with intractable migraine 06/11/2018   Depression     Diabetes mellitus without complication (HCC)     Dysmenorrhea 02/27/2014   Fibroadenoma of right breast 08/26/2015   GERD (gastroesophageal reflux disease)     Headache      Hematuria 02/27/2014   Hypertension     PTSD (post-traumatic stress disorder)     Screening for STD (sexually transmitted disease) 03/27/2014   Seasonal allergies     SUI (stress urinary incontinence, female) 02/27/2014   Vaginal discharge 08/06/2015   Warts, genital 01/23/2015   Yeast infection 08/06/2015           Past Surgical History:  Procedure Laterality Date   BALLOON DILATION N/A 03/24/2022    Procedure: BALLOON DILATION;  Surgeon: Lanelle Bal, DO;  Location: [facility];  Service: Endoscopy;  Laterality: N/A;   BILATERAL SALPINGECTOMY Bilateral 05/09/2017    Procedure: BILATERAL SALPINGECTOMY;  Surgeon: [name], MD;  Location: [facility];  Service: Gynecology;  Laterality: Bilateral;   COLONOSCOPY N/A 09/10/2013    XBJ:YNWGN;/FAOZH internal hemorrhoids   COLONOSCOPY WITH PROPOFOL N/A 03/24/2022    Surgeon: Lanelle Bal, DO;   Hemorrhoids on perianal exam, internal hemorrhoids, otherwise normal exam.  Repeat in 10 years.   ESOPHAGOGASTRODUODENOSCOPY N/A 09/10/2013    YQM:VHQIONGE ring at the gastroesophagral juctions/mild non-erosive gastritis   ESOPHAGOGASTRODUODENOSCOPY   2017    Dr. Teena Dunk; Schatzki's ring in the distal esophagus, otherwise normal exam.   ESOPHAGOGASTRODUODENOSCOPY (EGD) WITH PROPOFOL N/A 03/24/2022    Surgeon: Lanelle Bal, DO;   Mild Schatzki's ring dilated, 2 gastric polyps removed, otherwise normal exam. Polyps were hyperplastic.   LABIOPLASTY   02/14/2012    Procedure: LABIAPLASTY;  Surgeon: [name], MD;  Location: [facility];  Service: Gynecology;  Laterality: Right;  of the  right labia minora   POLYPECTOMY   03/24/2022    Procedure: POLYPECTOMY;  Surgeon: Lanelle Bal, DO;  Location: [facility];  Service: Endoscopy;;   SUPRACERVICAL ABDOMINAL HYSTERECTOMY N/A 05/09/2017    Procedure: HYSTERECTOMY SUPRACERVICAL ABDOMINAL;  Surgeon: [name], MD;  Location: [facility];  Service: Gynecology;  Laterality: N/A;   WISDOM  TOOTH EXTRACTION   10/18/1995    Dr. Manson Passey           Family History  Problem Relation Age of Onset   Lung cancer Mother     Asthma Sister     Allergies Sister     Mental illness Maternal Aunt          depression, PTSD   Diabetes Maternal Grandmother     Cancer Maternal Grandfather          skin   Colon cancer Neg Hx        Social History        Tobacco Use   Smoking status: Never   Smokeless tobacco: Never  Vaping Use   Vaping Use: Never used  Substance Use Topics   Alcohol use: No   Drug use: No      Medications: I have reviewed the patient's current medications. Allergies as of 02/23/2023         Reactions    Effexor [venlafaxine] Rash, Hypertension    SSRI syndrome    Latex Dermatitis    Tears skin     Codeine Nausea And Vomiting    Can take with nausea meds    Shellfish Allergy Hives    Breaks out in Hives    Sulfa Antibiotics Swelling    Amoxicillin Rash    Ceftin [cefuroxime] Rash            Medication List           Accurate as of Feb 23, 2023  3:43 PM. If you have any questions, ask your nurse or doctor.              Accu-Chek Guide test strip Generic drug: glucose blood    Accu-Chek Softclix Lancets lancets SMARTSIG:Topical    acetaminophen 500 MG tablet Commonly known as: TYLENOL Take 1,000 mg by mouth every 6 (six) hours as needed for moderate pain.    albuterol 108 (90 Base) MCG/ACT inhaler Commonly known as: VENTOLIN HFA Inhale 2 puffs into the lungs every 6 (six) hours as needed for shortness of breath or wheezing.    ALPRAZolam 1 MG tablet Commonly known as: XANAX Take 1 mg by mouth 3 (three) times daily.    Azelaic Acid 15 % gel Apply 1 Application topically 2 (two) times daily.    baclofen 10 MG tablet Commonly known as: LIORESAL TAKE 1/2 TO 1 TABLET BY MOUTH THREE TIMES DAILY AS NEEDED FOR muscle spasm    cetirizine 10 MG tablet Commonly known as: ZYRTEC Take 10 mg by mouth daily.    Diclofenac Potassium(Migraine)  50 MG Pack take 50-100 MG AS NEEDED FOR migraine. max DOSE of 100mg  in 24 hours    doxycycline 100 MG capsule Commonly known as: VIBRAMYCIN Take 100 mg by mouth 2 (two) times daily.    esomeprazole 40 MG capsule Commonly known as: NEXIUM Take 40 mg by mouth 2 (two) times daily.    gabapentin 400 MG capsule Commonly known as: NEURONTIN Take 400 mg by mouth in the morning, at noon, in the evening, and at bedtime.    hydrochlorothiazide  25 MG tablet Commonly known as: HYDRODIURIL Take 25 mg by mouth daily.    ibuprofen 600 MG tablet Commonly known as: ADVIL Take 600 mg by mouth 3 (three) times daily.    ketoconazole 2 % shampoo Commonly known as: NIZORAL Apply 1 application. topically daily as needed (scalp irritation).    ketorolac 10 MG tablet Commonly known as: TORADOL Take 1 tablet (10 mg total) by mouth every 8 (eight) hours as needed (for migraine). Limit use to 5 days per month    labetalol 100 MG tablet Commonly known as: NORMODYNE Take 50 mg by mouth 2 (two) times daily.    Linzess 145 MCG Caps capsule Generic drug: linaclotide TAKE ONE CAPSULE BY MOUTH EVERY MORNING BEFORE breakfast    metroNIDAZOLE 1 % gel Commonly known as: METROGEL Apply 1 Application topically daily.    naratriptan 2.5 MG tablet Commonly known as: AMERGE Take 1 tablet (2.5 mg total) by mouth as needed for migraine. Take one (1) tablet at onset of headache; if returns or does not resolve, may repeat after 4 hours; do not exceed five (5) mg in 24 hours.    nystatin ointment Commonly known as: MYCOSTATIN Apply 1 application. topically daily as needed (irritation under breasts).    ondansetron 8 MG tablet Commonly known as: ZOFRAN Take 8 mg by mouth every 8 (eight) hours as needed for nausea.    Procto-Med HC 2.5 % rectal cream Generic drug: hydrocortisone APPLY 1 GRAM RECTALLY TWICE DAILY    Symbicort 160-4.5 MCG/ACT inhaler Generic drug: budesonide-formoterol Inhale 2 puffs into  the lungs 2 (two) times daily.    SYSTANE OP Place 1 drop into both eyes daily as needed (dry eyes).    Vyepti 100 MG/ML injection Generic drug: Eptinezumab-jjmr Inject 100 mg into the vein. 4  times a year               ROS:  Constitutional: negative for chills, fatigue, and fevers Eyes: negative for visual disturbance and pain Ears, nose, mouth, throat, and face: negative for ear drainage, sore throat, and sinus problems Respiratory: positive for wheezing, negative for cough and shortness of breath Cardiovascular: negative for chest pain and palpitations Gastrointestinal: positive for abdominal pain and reflux symptoms, negative for nausea and vomiting Genitourinary:negative for dysuria and frequency Integument/breast: positive for dryness, negative for rash Hematologic/lymphatic: negative for bleeding and lymphadenopathy Musculoskeletal:negative for back pain and neck pain Neurological: negative for dizziness and tremors Endocrine: positive for temperature intolerance   Blood pressure 135/78, pulse (!) 104, temperature 98.4 F (36.9 C), temperature source Oral, resp. rate 14, height 5\' 1"  (1.549 m), weight 234 lb (106.1 kg), last menstrual period 04/21/2017, SpO2 96 %. Physical Exam Vitals reviewed.  Constitutional:      Appearance: Normal appearance.  HENT:     Head: Normocephalic and atraumatic.  Eyes:     Extraocular Movements: Extraocular movements intact.     Pupils: Pupils are equal, round, and reactive to light.  Cardiovascular:     Rate and Rhythm: Normal rate and regular rhythm.  Pulmonary:     Effort: Pulmonary effort is normal.     Breath sounds: Normal breath sounds.  Abdominal:     Comments: Abdomen soft, nondistended, no percussion tenderness, mild right upper quadrant tenderness to palpation; no rigidity, guarding, rebound tenderness; negative Murphy sign  Musculoskeletal:        General: Normal range of motion.     Cervical back: Normal range of  motion.  Skin:  General: Skin is warm and dry.  Neurological:     General: No focal deficit present.     Mental Status: She is alert and oriented to person, place, and time.  Psychiatric:        Mood and Affect: Mood normal.        Behavior: Behavior normal.        Results: HIDA scan (07/22/2022): IMPRESSION: 1.  Patent cystic and common bile ducts.   2.  Normal gallbladder ejection fraction.   Abdominal ultrasound 02/03/2023): IMPRESSION: 1. No cholelithiasis or sonographic evidence for acute cholecystitis. 2. Hepatic steatosis.   Assessment & Plan:  Cindy Stevenson is a 47 y.o. female who presents for evaluation of right upper quadrant abdominal pain.   -I explained the typical gallbladder pathophysiology and symptoms associated with it.  While her symptoms are consistent with gallbladder pathology, she does not have any imaging abnormalities noted.  Given that she underwent a HIDA scan in October, I do not see a reason to repeat this imaging study at this time.  Also given that she had right upper quadrant abdominal pain with the study consistent with her other pain, this suggests gallbladder involvement and her symptoms -We discussed that there is a chance that her symptoms will fail to improve even with surgery -I counseled the patient about the indication, risks and benefits of robotic assisted laparoscopic cholecystectomy.  She understands there is a very small chance for bleeding, infection, injury to normal structures (including common bile duct), conversion to open surgery, persistent symptoms, evolution of postcholecystectomy diarrhea, need for secondary interventions, anesthesia reaction, cardiopulmonary issues and other risks not specifically detailed here. I described the expected recovery, the plan for follow-up and the restrictions during the recovery phase.  All questions were answered. -Will schedule the patient for surgery at her earliest convenience -Information  provided to the patient regarding cholecystectomy and biliary colic   All questions were answered to the satisfaction of the patient.   Theophilus Kinds, DO [facility] [address] [place], [place] [address] [phone] (office)

## 2023-03-01 NOTE — H&P (Signed)
[facility] History and Physical   Reason for Referral: Right upper quadrant abdominal pain,?  Biliary dyskinesia Referring Physician: Lewie Loron, NP   Chief Complaint   New Patient (Initial Visit)        Cindy Stevenson is a 47 y.o. female.  HPI: Patient presents for evaluation of persistent right upper quadrant abdominal pain.  Starting about 5 years ago, she was having monthly episodes of diarrhea, cold sweats, and foul-smelling burping.  Recently, associated with these episodes, she has had right upper quadrant abdominal pain and pain with sleeping on her right side.  The pain is almost always present.  The pain will radiate along the right side of her abdomen and into the epigastrium.  She had a colonoscopy and EGD last summer, and the colonoscopy demonstrated some internal hemorrhoids, while the EGD demonstrated a mild Schatzki's ring and gastric polyps that were hyperplastic.  She has undergone numerous abdominal imaging studies which do not demonstrate any cholelithiasis.  In October, she underwent a HIDA scan which demonstrated normal gallbladder function.  Patient states that when she underwent the study, she had right upper quadrant abdominal pain similar to the pain that she has been having with her previous episodes.  Her past medical history significant for diabetes, hypertension, depression, PTSD, and migraines.  She denies use of blood thinning medications.  She underwent an open partial hysterectomy.  She denies use of tobacco products, alcohol, and illicit drugs.       Past Medical History:  Diagnosis Date   Anxiety     Breast nodule 08/06/2015   Breast pain, left 08/06/2015   Burning with urination 02/27/2014   Common migraine with intractable migraine 06/11/2018   Depression     Diabetes mellitus without complication (HCC)     Dysmenorrhea 02/27/2014   Fibroadenoma of right breast 08/26/2015   GERD (gastroesophageal reflux disease)     Headache      Hematuria 02/27/2014   Hypertension     PTSD (post-traumatic stress disorder)     Screening for STD (sexually transmitted disease) 03/27/2014   Seasonal allergies     SUI (stress urinary incontinence, female) 02/27/2014   Vaginal discharge 08/06/2015   Warts, genital 01/23/2015   Yeast infection 08/06/2015           Past Surgical History:  Procedure Laterality Date   BALLOON DILATION N/A 03/24/2022    Procedure: BALLOON DILATION;  Surgeon: Lanelle Bal, DO;  Location: [facility];  Service: Endoscopy;  Laterality: N/A;   BILATERAL SALPINGECTOMY Bilateral 05/09/2017    Procedure: BILATERAL SALPINGECTOMY;  Surgeon: [name], MD;  Location: [facility];  Service: Gynecology;  Laterality: Bilateral;   COLONOSCOPY N/A 09/10/2013    XBJ:YNWGN;/FAOZH internal hemorrhoids   COLONOSCOPY WITH PROPOFOL N/A 03/24/2022    Surgeon: Lanelle Bal, DO;   Hemorrhoids on perianal exam, internal hemorrhoids, otherwise normal exam.  Repeat in 10 years.   ESOPHAGOGASTRODUODENOSCOPY N/A 09/10/2013    YQM:VHQIONGE ring at the gastroesophagral juctions/mild non-erosive gastritis   ESOPHAGOGASTRODUODENOSCOPY   2017    Dr. Teena Dunk; Schatzki's ring in the distal esophagus, otherwise normal exam.   ESOPHAGOGASTRODUODENOSCOPY (EGD) WITH PROPOFOL N/A 03/24/2022    Surgeon: Lanelle Bal, DO;   Mild Schatzki's ring dilated, 2 gastric polyps removed, otherwise normal exam. Polyps were hyperplastic.   LABIOPLASTY   02/14/2012    Procedure: LABIAPLASTY;  Surgeon: [name], MD;  Location: [facility];  Service: Gynecology;  Laterality: Right;  of the  right labia minora   POLYPECTOMY   03/24/2022    Procedure: POLYPECTOMY;  Surgeon: Lanelle Bal, DO;  Location: [facility];  Service: Endoscopy;;   SUPRACERVICAL ABDOMINAL HYSTERECTOMY N/A 05/09/2017    Procedure: HYSTERECTOMY SUPRACERVICAL ABDOMINAL;  Surgeon: [name], MD;  Location: [facility];  Service: Gynecology;  Laterality: N/A;   WISDOM  TOOTH EXTRACTION   10/18/1995    Dr. Manson Passey           Family History  Problem Relation Age of Onset   Lung cancer Mother     Asthma Sister     Allergies Sister     Mental illness Maternal Aunt          depression, PTSD   Diabetes Maternal Grandmother     Cancer Maternal Grandfather          skin   Colon cancer Neg Hx        Social History        Tobacco Use   Smoking status: Never   Smokeless tobacco: Never  Vaping Use   Vaping Use: Never used  Substance Use Topics   Alcohol use: No   Drug use: No      Medications: I have reviewed the patient's current medications. Allergies as of 02/23/2023         Reactions    Effexor [venlafaxine] Rash, Hypertension    SSRI syndrome    Latex Dermatitis    Tears skin     Codeine Nausea And Vomiting    Can take with nausea meds    Shellfish Allergy Hives    Breaks out in Hives    Sulfa Antibiotics Swelling    Amoxicillin Rash    Ceftin [cefuroxime] Rash            Medication List           Accurate as of Feb 23, 2023  3:43 PM. If you have any questions, ask your nurse or doctor.              Accu-Chek Guide test strip Generic drug: glucose blood    Accu-Chek Softclix Lancets lancets SMARTSIG:Topical    acetaminophen 500 MG tablet Commonly known as: TYLENOL Take 1,000 mg by mouth every 6 (six) hours as needed for moderate pain.    albuterol 108 (90 Base) MCG/ACT inhaler Commonly known as: VENTOLIN HFA Inhale 2 puffs into the lungs every 6 (six) hours as needed for shortness of breath or wheezing.    ALPRAZolam 1 MG tablet Commonly known as: XANAX Take 1 mg by mouth 3 (three) times daily.    Azelaic Acid 15 % gel Apply 1 Application topically 2 (two) times daily.    baclofen 10 MG tablet Commonly known as: LIORESAL TAKE 1/2 TO 1 TABLET BY MOUTH THREE TIMES DAILY AS NEEDED FOR muscle spasm    cetirizine 10 MG tablet Commonly known as: ZYRTEC Take 10 mg by mouth daily.    Diclofenac Potassium(Migraine)  50 MG Pack take 50-100 MG AS NEEDED FOR migraine. max DOSE of 100mg  in 24 hours    doxycycline 100 MG capsule Commonly known as: VIBRAMYCIN Take 100 mg by mouth 2 (two) times daily.    esomeprazole 40 MG capsule Commonly known as: NEXIUM Take 40 mg by mouth 2 (two) times daily.    gabapentin 400 MG capsule Commonly known as: NEURONTIN Take 400 mg by mouth in the morning, at noon, in the evening, and at bedtime.    hydrochlorothiazide  25 MG tablet Commonly known as: HYDRODIURIL Take 25 mg by mouth daily.    ibuprofen 600 MG tablet Commonly known as: ADVIL Take 600 mg by mouth 3 (three) times daily.    ketoconazole 2 % shampoo Commonly known as: NIZORAL Apply 1 application. topically daily as needed (scalp irritation).    ketorolac 10 MG tablet Commonly known as: TORADOL Take 1 tablet (10 mg total) by mouth every 8 (eight) hours as needed (for migraine). Limit use to 5 days per month    labetalol 100 MG tablet Commonly known as: NORMODYNE Take 50 mg by mouth 2 (two) times daily.    Linzess 145 MCG Caps capsule Generic drug: linaclotide TAKE ONE CAPSULE BY MOUTH EVERY MORNING BEFORE breakfast    metroNIDAZOLE 1 % gel Commonly known as: METROGEL Apply 1 Application topically daily.    naratriptan 2.5 MG tablet Commonly known as: AMERGE Take 1 tablet (2.5 mg total) by mouth as needed for migraine. Take one (1) tablet at onset of headache; if returns or does not resolve, may repeat after 4 hours; do not exceed five (5) mg in 24 hours.    nystatin ointment Commonly known as: MYCOSTATIN Apply 1 application. topically daily as needed (irritation under breasts).    ondansetron 8 MG tablet Commonly known as: ZOFRAN Take 8 mg by mouth every 8 (eight) hours as needed for nausea.    Procto-Med HC 2.5 % rectal cream Generic drug: hydrocortisone APPLY 1 GRAM RECTALLY TWICE DAILY    Symbicort 160-4.5 MCG/ACT inhaler Generic drug: budesonide-formoterol Inhale 2 puffs into  the lungs 2 (two) times daily.    SYSTANE OP Place 1 drop into both eyes daily as needed (dry eyes).    Vyepti 100 MG/ML injection Generic drug: Eptinezumab-jjmr Inject 100 mg into the vein. 4  times a year               ROS:  Constitutional: negative for chills, fatigue, and fevers Eyes: negative for visual disturbance and pain Ears, nose, mouth, throat, and face: negative for ear drainage, sore throat, and sinus problems Respiratory: positive for wheezing, negative for cough and shortness of breath Cardiovascular: negative for chest pain and palpitations Gastrointestinal: positive for abdominal pain and reflux symptoms, negative for nausea and vomiting Genitourinary:negative for dysuria and frequency Integument/breast: positive for dryness, negative for rash Hematologic/lymphatic: negative for bleeding and lymphadenopathy Musculoskeletal:negative for back pain and neck pain Neurological: negative for dizziness and tremors Endocrine: positive for temperature intolerance   Blood pressure 135/78, pulse (!) 104, temperature 98.4 F (36.9 C), temperature source Oral, resp. rate 14, height 5\' 1"  (1.549 m), weight 234 lb (106.1 kg), last menstrual period 04/21/2017, SpO2 96 %. Physical Exam Vitals reviewed.  Constitutional:      Appearance: Normal appearance.  HENT:     Head: Normocephalic and atraumatic.  Eyes:     Extraocular Movements: Extraocular movements intact.     Pupils: Pupils are equal, round, and reactive to light.  Cardiovascular:     Rate and Rhythm: Normal rate and regular rhythm.  Pulmonary:     Effort: Pulmonary effort is normal.     Breath sounds: Normal breath sounds.  Abdominal:     Comments: Abdomen soft, nondistended, no percussion tenderness, mild right upper quadrant tenderness to palpation; no rigidity, guarding, rebound tenderness; negative Murphy sign  Musculoskeletal:        General: Normal range of motion.     Cervical back: Normal range of  motion.  Skin:  General: Skin is warm and dry.  Neurological:     General: No focal deficit present.     Mental Status: She is alert and oriented to person, place, and time.  Psychiatric:        Mood and Affect: Mood normal.        Behavior: Behavior normal.        Results: HIDA scan (07/22/2022): IMPRESSION: 1.  Patent cystic and common bile ducts.   2.  Normal gallbladder ejection fraction.   Abdominal ultrasound 02/03/2023): IMPRESSION: 1. No cholelithiasis or sonographic evidence for acute cholecystitis. 2. Hepatic steatosis.   Assessment & Plan:  Cindy Stevenson is a 47 y.o. female who presents for evaluation of right upper quadrant abdominal pain.   -I explained the typical gallbladder pathophysiology and symptoms associated with it.  While her symptoms are consistent with gallbladder pathology, she does not have any imaging abnormalities noted.  Given that she underwent a HIDA scan in October, I do not see a reason to repeat this imaging study at this time.  Also given that she had right upper quadrant abdominal pain with the study consistent with her other pain, this suggests gallbladder involvement and her symptoms -We discussed that there is a chance that her symptoms will fail to improve even with surgery -I counseled the patient about the indication, risks and benefits of robotic assisted laparoscopic cholecystectomy.  She understands there is a very small chance for bleeding, infection, injury to normal structures (including common bile duct), conversion to open surgery, persistent symptoms, evolution of postcholecystectomy diarrhea, need for secondary interventions, anesthesia reaction, cardiopulmonary issues and other risks not specifically detailed here. I described the expected recovery, the plan for follow-up and the restrictions during the recovery phase.  All questions were answered. -Will schedule the patient for surgery at her earliest convenience -Information  provided to the patient regarding cholecystectomy and biliary colic   All questions were answered to the satisfaction of the patient.   Theophilus Kinds, DO [facility] [address] [place], [place] [address] [phone] (office)

## 2023-03-09 ENCOUNTER — Encounter: Payer: Medicaid Other | Admitting: Gastroenterology

## 2023-03-18 HISTORY — PX: CHOLECYSTECTOMY: SHX55

## 2023-03-28 NOTE — Patient Instructions (Signed)
Cindy Stevenson  03/28/2023     @PREFPERIOPPHARMACY @   Your procedure is scheduled on  03/31/2023.   Report to [facility] at  0600  A.M.   Call this number if you have problems the morning of surgery:  [phone]  If you experience any cold or flu symptoms such as cough, fever, chills, shortness of breath, etc. between now and your scheduled surgery, please notify us at the above number.   Remember:  Do not eat or drink after midnight.      Your last dose of Ozempic should have been on 03/23/2023 or before.      DO NOT take any medications for diabetes the morning of your procedure.       Use your inhaler before you come and bring your rescue inhaler with you.    Take these medicines the morning of surgery with A SIP OF WATER           xanax(if needed), baclofen (if needed), esomeprazole, allegra, gabapentin, ketoralac (if needed), labetolol, emerge(if needed).      Do not wear jewelry, make-up or nail polish, including gel polish,  artificial nails, or any other type of covering on natural nails (fingers and  toes).  Do not wear lotions, powders, or perfumes, or deodorant.  Do not shave 48 hours prior to surgery.  Men may shave face and neck.  Do not bring valuables to the hospital.  [facility] is not responsible for any belongings or valuables.  Contacts, dentures or bridgework may not be worn into surgery.  Leave your suitcase in the car.  After surgery it may be brought to your room.  For patients admitted to the hospital, discharge time will be determined by your treatment team.  Patients discharged the day of surgery will not be allowed to drive home and must have someone with them for 24 hours.    Special instructions:   DO NOT smoke tobacco or vape for 24 hours before your procedure.   Please read over the following fact sheets that you were given. Pain Booklet, Coughing and Deep Breathing, Surgical Site Infection Prevention, Anesthesia  Post-op Instructions, and Care and Recovery After Surgery      Minimally Invasive Cholecystectomy, Care After The following information offers guidance on how to care for yourself after your procedure. Your health care provider may also give you more specific instructions. If you have problems or questions, contact your health care provider. What can I expect after the procedure? After the procedure, it is common to have: Pain at your incision sites. You will be given medicines to control this pain. Mild nausea or vomiting. Bloating and possible shoulder pain from the gas that was used during the procedure. Follow these instructions at home: Medicines Take over-the-counter and prescription medicines only as told by your health care provider. If you were prescribed an antibiotic medicine, take it as told by your health care provider. Do not stop using the antibiotic even if you start to feel better. Ask your health care provider if the medicine prescribed to you: Requires you to avoid driving or using machinery. Can cause constipation. You may need to take these actions to prevent or treat constipation: Drink enough fluid to keep your urine pale yellow. Take over-the-counter or prescription medicines. Eat foods that are high in fiber, such as beans, whole grains, and fresh fruits and vegetables. Limit foods that are high in fat and processed sugars, such  as fried or sweet foods. Incision care  Follow instructions from your health care provider about how to take care of your incisions. Make sure you: Wash your hands with soap and water for at least 20 seconds before and after you change your bandage (dressing). If soap and water are not available, use hand sanitizer. Change your dressing as told by your health care provider. Leave stitches (sutures), skin glue, or adhesive strips in place. These skin closures may need to be in place for 2 weeks or longer. If adhesive strip edges start to  loosen and curl up, you may trim the loose edges. Do not remove adhesive strips completely unless your health care provider tells you to do that. Do not take baths, swim, or use a hot tub until your health care provider approves. Ask your health care provider if you may take showers. You may only be allowed to take sponge baths. Check your incision area every day for signs of infection. Check for: More redness, swelling, or pain. Fluid or blood. Warmth. Pus or a bad smell. Activity Rest as told by your health care provider. Do not do activities that require a lot of effort. Avoid sitting for a long time without moving. Get up to take short walks every 1-2 hours. This is important to improve blood flow and breathing. Ask for help if you feel weak or unsteady. Do not lift anything that is heavier than 10 lb (4.5 kg), or the limit that you are told, until your health care provider says that it is safe. Do not play contact sports until your health care provider approves. Do not return to work or school until your health care provider approves. Return to your normal activities as told by your health care provider. Ask your health care provider what activities are safe for you. General instructions If you were given a sedative during the procedure, it can affect you for several hours. Do not drive or operate machinery until your health care provider says that it is safe. Keep all follow-up visits. This is important. Contact a health care provider if: You develop a rash. You have more redness, swelling, or pain around your incisions. You have fluid or blood coming from your incisions. Your incisions feel warm to the touch. You have pus or a bad smell coming from your incisions. You have a fever. One or more of your incisions breaks open. Get help right away if: You have trouble breathing. You have chest pain. You have more pain in your shoulders. You faint or feel dizzy when you stand. You  have severe pain in your abdomen. You have nausea or vomiting that lasts for more than one day. You have leg pain that is new or unusual, or if it is localized to one specific spot. These symptoms may represent a serious problem that is an emergency. Do not wait to see if the symptoms will go away. Get medical help right away. Call your local emergency services (911 in the [place] Do not drive yourself to the hospital. Summary After your procedure, it is common to have pain at the incision sites. You may also have nausea or bloating. Follow your health care provider's instructions about medicine, activity restrictions, and caring for your incision areas. Do not do activities that require a lot of effort. Contact a health care provider if you have a fever or other signs of infection, such as more redness, swelling, or pain around the incisions. Get help right away if  you have chest pain, increasing pain in the shoulders, or trouble breathing. This information is not intended to replace advice given to you by your health care provider. Make sure you discuss any questions you have with your health care provider. Document Revised: 04/06/2021 Document Reviewed: 04/06/2021 Elsevier Patient Education  2024 [employer] General Anesthesia, Adult, Care After The following information offers guidance on how to care for yourself after your procedure. Your health care provider may also give you more specific instructions. If you have problems or questions, contact your health care provider. What can I expect after the procedure? After the procedure, it is common for people to: Have pain or discomfort at the IV site. Have nausea or vomiting. Have a sore throat or hoarseness. Have trouble concentrating. Feel cold or chills. Feel weak, sleepy, or tired (fatigue). Have soreness and body aches. These can affect parts of the body that were not involved in surgery. Follow these instructions at home: For the  time period you were told by your health care provider:  Rest. Do not participate in activities where you could fall or become injured. Do not drive or use machinery. Do not drink alcohol. Do not take sleeping pills or medicines that cause drowsiness. Do not make important decisions or sign legal documents. Do not take care of children on your own. General instructions Drink enough fluid to keep your urine pale yellow. If you have sleep apnea, surgery and certain medicines can increase your risk for breathing problems. Follow instructions from your health care provider about wearing your sleep device: Anytime you are sleeping, including during daytime naps. While taking prescription pain medicines, sleeping medicines, or medicines that make you drowsy. Return to your normal activities as told by your health care provider. Ask your health care provider what activities are safe for you. Take over-the-counter and prescription medicines only as told by your health care provider. Do not use any products that contain nicotine or tobacco. These products include cigarettes, chewing tobacco, and vaping devices, such as e-cigarettes. These can delay incision healing after surgery. If you need help quitting, ask your health care provider. Contact a health care provider if: You have nausea or vomiting that does not get better with medicine. You vomit every time you eat or drink. You have pain that does not get better with medicine. You cannot urinate or have bloody urine. You develop a skin rash. You have a fever. Get help right away if: You have trouble breathing. You have chest pain. You vomit blood. These symptoms may be an emergency. Get help right away. Call 911. Do not wait to see if the symptoms will go away. Do not drive yourself to the hospital. Summary After the procedure, it is common to have a sore throat, hoarseness, nausea, vomiting, or to feel weak, sleepy, or fatigue. For the  time period you were told by your health care provider, do not drive or use machinery. Get help right away if you have difficulty breathing, have chest pain, or vomit blood. These symptoms may be an emergency. This information is not intended to replace advice given to you by your health care provider. Make sure you discuss any questions you have with your health care provider. Document Revised: 12/31/2021 Document Reviewed: 12/31/2021 Elsevier Patient Education  2024 [employer] How to Use Chlorhexidine Before Surgery Chlorhexidine gluconate (CHG) is a germ-killing (antiseptic) solution that is used to clean the skin. It can get rid of the bacteria that normally live on the  skin and can keep them away for about 24 hours. To clean your skin with CHG, you may be given: A CHG solution to use in the shower or as part of a sponge bath. A prepackaged cloth that contains CHG. Cleaning your skin with CHG may help lower the risk for infection: While you are staying in the intensive care unit of the hospital. If you have a vascular access, such as a central line, to provide short-term or long-term access to your veins. If you have a catheter to drain urine from your bladder. If you are on a ventilator. A ventilator is a machine that helps you breathe by moving air in and out of your lungs. After surgery. What are the risks? Risks of using CHG include: A skin reaction. Hearing loss, if CHG gets in your ears and you have a perforated eardrum. Eye injury, if CHG gets in your eyes and is not rinsed out. The CHG product catching fire. Make sure that you avoid smoking and flames after applying CHG to your skin. Do not use CHG: If you have a chlorhexidine allergy or have previously reacted to chlorhexidine. On babies younger than 62 months of age. How to use CHG solution Use CHG only as told by your health care provider, and follow the instructions on the label. Use the full amount of CHG as directed.  Usually, this is one bottle. During a shower Follow these steps when using CHG solution during a shower (unless your health care provider gives you different instructions): Start the shower. Use your normal soap and shampoo to wash your face and hair. Turn off the shower or move out of the shower stream. Pour the CHG onto a clean washcloth. Do not use any type of brush or rough-edged sponge. Starting at your neck, lather your body down to your toes. Make sure you follow these instructions: If you will be having surgery, pay special attention to the part of your body where you will be having surgery. Scrub this area for at least 1 minute. Do not use CHG on your head or face. If the solution gets into your ears or eyes, rinse them well with water. Avoid your genital area. Avoid any areas of skin that have broken skin, cuts, or scrapes. Scrub your back and under your arms. Make sure to wash skin folds. Let the lather sit on your skin for 1-2 minutes or as long as told by your health care provider. Thoroughly rinse your entire body in the shower. Make sure that all body creases and crevices are rinsed well. Dry off with a clean towel. Do not put any substances on your body afterward--such as powder, lotion, or perfume--unless you are told to do so by your health care provider. Only use lotions that are recommended by the manufacturer. Put on clean clothes or pajamas. If it is the night before your surgery, sleep in clean sheets.  During a sponge bath Follow these steps when using CHG solution during a sponge bath (unless your health care provider gives you different instructions): Use your normal soap and shampoo to wash your face and hair. Pour the CHG onto a clean washcloth. Starting at your neck, lather your body down to your toes. Make sure you follow these instructions: If you will be having surgery, pay special attention to the part of your body where you will be having surgery. Scrub this  area for at least 1 minute. Do not use CHG on your head or face.  If the solution gets into your ears or eyes, rinse them well with water. Avoid your genital area. Avoid any areas of skin that have broken skin, cuts, or scrapes. Scrub your back and under your arms. Make sure to wash skin folds. Let the lather sit on your skin for 1-2 minutes or as long as told by your health care provider. Using a different clean, wet washcloth, thoroughly rinse your entire body. Make sure that all body creases and crevices are rinsed well. Dry off with a clean towel. Do not put any substances on your body afterward--such as powder, lotion, or perfume--unless you are told to do so by your health care provider. Only use lotions that are recommended by the manufacturer. Put on clean clothes or pajamas. If it is the night before your surgery, sleep in clean sheets. How to use CHG prepackaged cloths Only use CHG cloths as told by your health care provider, and follow the instructions on the label. Use the CHG cloth on clean, dry skin. Do not use the CHG cloth on your head or face unless your health care provider tells you to. When washing with the CHG cloth: Avoid your genital area. Avoid any areas of skin that have broken skin, cuts, or scrapes. Before surgery Follow these steps when using a CHG cloth to clean before surgery (unless your health care provider gives you different instructions): Using the CHG cloth, vigorously scrub the part of your body where you will be having surgery. Scrub using a back-and-forth motion for 3 minutes. The area on your body should be completely wet with CHG when you are done scrubbing. Do not rinse. Discard the cloth and let the area air-dry. Do not put any substances on the area afterward, such as powder, lotion, or perfume. Put on clean clothes or pajamas. If it is the night before your surgery, sleep in clean sheets.  For general bathing Follow these steps when using CHG  cloths for general bathing (unless your health care provider gives you different instructions). Use a separate CHG cloth for each area of your body. Make sure you wash between any folds of skin and between your fingers and toes. Wash your body in the following order, switching to a new cloth after each step: The front of your neck, shoulders, and chest. Both of your arms, under your arms, and your hands. Your stomach and groin area, avoiding the genitals. Your right leg and foot. Your left leg and foot. The back of your neck, your back, and your buttocks. Do not rinse. Discard the cloth and let the area air-dry. Do not put any substances on your body afterward--such as powder, lotion, or perfume--unless you are told to do so by your health care provider. Only use lotions that are recommended by the manufacturer. Put on clean clothes or pajamas. Contact a health care provider if: Your skin gets irritated after scrubbing. You have questions about using your solution or cloth. You swallow any chlorhexidine. Call your local poison control center [phone] in the [place] Get help right away if: Your eyes itch badly, or they become very red or swollen. Your skin itches badly and is red or swollen. Your hearing changes. You have trouble seeing. You have swelling or tingling in your mouth or throat. You have trouble breathing. These symptoms may represent a serious problem that is an emergency. Do not wait to see if the symptoms will go away. Get medical help right away. Call your local emergency services (911  in the [place] Do not drive yourself to the hospital. Summary Chlorhexidine gluconate (CHG) is a germ-killing (antiseptic) solution that is used to clean the skin. Cleaning your skin with CHG may help to lower your risk for infection. You may be given CHG to use for bathing. It may be in a bottle or in a prepackaged cloth to use on your skin. Carefully follow your health care provider's  instructions and the instructions on the product label. Do not use CHG if you have a chlorhexidine allergy. Contact your health care provider if your skin gets irritated after scrubbing. This information is not intended to replace advice given to you by your health care provider. Make sure you discuss any questions you have with your health care provider. Document Revised: 01/31/2022 Document Reviewed: 12/14/2020 Elsevier Patient Education  2023 [employer].

## 2023-03-29 ENCOUNTER — Encounter (HOSPITAL_COMMUNITY)
Admission: RE | Admit: 2023-03-29 | Discharge: 2023-03-29 | Disposition: A | Discharge status: Home / Self Care | Payer: Medicaid Other | Source: Ambulatory Visit | Attending: Surgery | Admitting: Surgery

## 2023-03-29 VITALS — BP 128/80 | HR 89 | Temp 97.8°F | Resp 18 | Ht 61.0 in | Wt 233.9 lb

## 2023-03-29 DIAGNOSIS — Z01812 Encounter for preprocedural laboratory examination: Secondary | ICD-10-CM | POA: Insufficient documentation

## 2023-03-29 DIAGNOSIS — E119 Type 2 diabetes mellitus without complications: Secondary | ICD-10-CM | POA: Insufficient documentation

## 2023-03-29 LAB — BASIC METABOLIC PANEL
Anion gap: 13 (ref 5–15)
BUN: 16 mg/dL (ref 6–20)
CO2: 18 mmol/L — ABNORMAL LOW (ref 22–32)
Calcium: 8.9 mg/dL (ref 8.9–10.3)
Chloride: 103 mmol/L (ref 98–111)
Creatinine, Ser: 0.84 mg/dL (ref 0.44–1.00)
GFR, Estimated: >60 mL/min (ref >60)
Glucose, Bld: 231 mg/dL — ABNORMAL HIGH (ref 70–99)
Potassium: 3.9 mmol/L (ref 3.5–5.1)
Sodium: 134 mmol/L — ABNORMAL LOW (ref 135–145)

## 2023-03-29 LAB — HEMOGLOBIN A1C
Hgb A1c MFr Bld: 8.6 % — ABNORMAL HIGH (ref 4.8–5.6)
Mean Plasma Glucose: 200.12 mg/dL

## 2023-03-31 ENCOUNTER — Encounter (HOSPITAL_COMMUNITY): Payer: Self-pay | Admitting: Surgery

## 2023-03-31 ENCOUNTER — Other Ambulatory Visit: Payer: Self-pay

## 2023-03-31 ENCOUNTER — Encounter (HOSPITAL_COMMUNITY)
Admission: RE | Disposition: A | Discharge status: Home / Self Care | Payer: Self-pay | Source: Home / Self Care | Attending: Surgery

## 2023-03-31 ENCOUNTER — Ambulatory Visit (HOSPITAL_COMMUNITY)
Admission: RE | Admit: 2023-03-31 | Discharge: 2023-03-31 | Disposition: A | Discharge status: Home / Self Care | Payer: Medicaid Other | Attending: Surgery | Admitting: Surgery

## 2023-03-31 ENCOUNTER — Ambulatory Visit (HOSPITAL_COMMUNITY): Payer: Medicaid Other | Admitting: Anesthesiology

## 2023-03-31 ENCOUNTER — Ambulatory Visit (HOSPITAL_BASED_OUTPATIENT_CLINIC_OR_DEPARTMENT_OTHER): Payer: Medicaid Other | Admitting: Anesthesiology

## 2023-03-31 DIAGNOSIS — F419 Anxiety disorder, unspecified: Secondary | ICD-10-CM | POA: Diagnosis not present

## 2023-03-31 DIAGNOSIS — Z6841 Body Mass Index (BMI) 40.0 and over, adult: Secondary | ICD-10-CM | POA: Insufficient documentation

## 2023-03-31 DIAGNOSIS — K760 Fatty (change of) liver, not elsewhere classified: Secondary | ICD-10-CM | POA: Insufficient documentation

## 2023-03-31 DIAGNOSIS — Z79899 Other long term (current) drug therapy: Secondary | ICD-10-CM | POA: Insufficient documentation

## 2023-03-31 DIAGNOSIS — Z7984 Long term (current) use of oral hypoglycemic drugs: Secondary | ICD-10-CM | POA: Diagnosis not present

## 2023-03-31 DIAGNOSIS — Z7951 Long term (current) use of inhaled steroids: Secondary | ICD-10-CM | POA: Insufficient documentation

## 2023-03-31 DIAGNOSIS — F32A Depression, unspecified: Secondary | ICD-10-CM | POA: Insufficient documentation

## 2023-03-31 DIAGNOSIS — I10 Essential (primary) hypertension: Secondary | ICD-10-CM | POA: Diagnosis not present

## 2023-03-31 DIAGNOSIS — K8050 Calculus of bile duct without cholangitis or cholecystitis without obstruction: Secondary | ICD-10-CM

## 2023-03-31 DIAGNOSIS — R1011 Right upper quadrant pain: Secondary | ICD-10-CM | POA: Diagnosis present

## 2023-03-31 DIAGNOSIS — K811 Chronic cholecystitis: Secondary | ICD-10-CM | POA: Insufficient documentation

## 2023-03-31 DIAGNOSIS — F418 Other specified anxiety disorders: Secondary | ICD-10-CM

## 2023-03-31 DIAGNOSIS — K219 Gastro-esophageal reflux disease without esophagitis: Secondary | ICD-10-CM | POA: Diagnosis not present

## 2023-03-31 DIAGNOSIS — E119 Type 2 diabetes mellitus without complications: Secondary | ICD-10-CM | POA: Insufficient documentation

## 2023-03-31 LAB — GLUCOSE, CAPILLARY
Glucose-Capillary: 224 mg/dL — ABNORMAL HIGH (ref 70–99)
Glucose-Capillary: 292 mg/dL — ABNORMAL HIGH (ref 70–99)

## 2023-03-31 SURGERY — CHOLECYSTECTOMY, ROBOT-ASSISTED, LAPAROSCOPIC
Anesthesia: General | Site: Abdomen

## 2023-03-31 MED ORDER — ONDANSETRON HCL 4 MG/2ML IJ SOLN
INTRAMUSCULAR | Status: DC | PRN
  Administered 2023-03-31: 4 mg via INTRAVENOUS

## 2023-03-31 MED ORDER — MIDAZOLAM HCL 2 MG/2ML IJ SOLN: 2.0000 mg | Freq: Once | INTRAMUSCULAR | Status: AC

## 2023-03-31 MED ORDER — MIDAZOLAM HCL 2 MG/2ML IJ SOLN
INTRAMUSCULAR | Status: AC
  Administered 2023-03-31: 2 mg via INTRAVENOUS
  Filled 2023-03-31: qty 2

## 2023-03-31 MED ORDER — CHLORHEXIDINE GLUCONATE 0.12 % MT SOLN
15.0000 mL | Freq: Once | OROMUCOSAL | Status: AC
  Administered 2023-03-31: 15 mL via OROMUCOSAL

## 2023-03-31 MED ORDER — ROCURONIUM 10MG/ML (10ML) SYRINGE FOR MEDFUSION PUMP - OPTIME
INTRAVENOUS | Status: DC | PRN
  Administered 2023-03-31: 10 mg via INTRAVENOUS
  Administered 2023-03-31: 20 mg via INTRAVENOUS
  Administered 2023-03-31: 60 mg via INTRAVENOUS

## 2023-03-31 MED ORDER — LACTATED RINGERS IV SOLN: INTRAVENOUS | Status: DC

## 2023-03-31 MED ORDER — STERILE WATER FOR IRRIGATION IR SOLN
Status: DC | PRN
  Administered 2023-03-31: 500 mL

## 2023-03-31 MED ORDER — INDOCYANINE GREEN 25 MG IV SOLR
2.5000 mg | Freq: Once | INTRAVENOUS | Status: AC
  Administered 2023-03-31: 2.5 mg via INTRAVENOUS

## 2023-03-31 MED ORDER — SUGAMMADEX SODIUM 200 MG/2ML IV SOLN
INTRAVENOUS | Status: DC | PRN
  Administered 2023-03-31: 200 mg via INTRAVENOUS

## 2023-03-31 MED ORDER — KETOROLAC TROMETHAMINE 30 MG/ML IJ SOLN
INTRAMUSCULAR | Status: AC
  Filled 2023-03-31: qty 1

## 2023-03-31 MED ORDER — MEPERIDINE HCL 50 MG/ML IJ SOLN: 6.2500 mg | INTRAMUSCULAR | Status: DC | PRN

## 2023-03-31 MED ORDER — ORAL CARE MOUTH RINSE: 15.0000 mL | Freq: Once | OROMUCOSAL | Status: AC

## 2023-03-31 MED ORDER — SCOPOLAMINE 1 MG/3DAYS TD PT72
1.0000 | MEDICATED_PATCH | Freq: Once | TRANSDERMAL | Status: DC
  Administered 2023-03-31: 1.5 mg via TRANSDERMAL
  Filled 2023-03-31: qty 1

## 2023-03-31 MED ORDER — DEXAMETHASONE SODIUM PHOSPHATE 10 MG/ML IJ SOLN
INTRAMUSCULAR | Status: DC | PRN
  Administered 2023-03-31: 10 mg via INTRAVENOUS

## 2023-03-31 MED ORDER — LIDOCAINE HCL (CARDIAC) PF 50 MG/5ML IV SOSY
PREFILLED_SYRINGE | INTRAVENOUS | Status: DC | PRN
  Administered 2023-03-31: 100 mg via INTRAVENOUS

## 2023-03-31 MED ORDER — OXYCODONE HCL 5 MG PO TABS: 5.0000 mg | ORAL_TABLET | Freq: Four times a day (QID) | ORAL | 0 refills | Status: DC | PRN

## 2023-03-31 MED ORDER — BUPIVACAINE HCL (PF) 0.5 % IJ SOLN
INTRAMUSCULAR | Status: DC | PRN
  Administered 2023-03-31: 30 mL

## 2023-03-31 MED ORDER — HYDROMORPHONE HCL 1 MG/ML IJ SOLN
0.2500 mg | INTRAMUSCULAR | Status: DC | PRN
  Administered 2023-03-31 (×2): 0.5 mg via INTRAVENOUS
  Filled 2023-03-31 (×2): qty 0.5

## 2023-03-31 MED ORDER — ROCURONIUM BROMIDE 10 MG/ML (PF) SYRINGE
PREFILLED_SYRINGE | INTRAVENOUS | Status: AC
  Filled 2023-03-31: qty 10

## 2023-03-31 MED ORDER — KETOROLAC TROMETHAMINE 30 MG/ML IJ SOLN
INTRAMUSCULAR | Status: DC | PRN
  Administered 2023-03-31: 30 mg via INTRAVENOUS

## 2023-03-31 MED ORDER — PROPOFOL 10 MG/ML IV BOLUS
INTRAVENOUS | Status: DC | PRN
  Administered 2023-03-31: 200 mg via INTRAVENOUS

## 2023-03-31 MED ORDER — FENTANYL CITRATE (PF) 250 MCG/5ML IJ SOLN
INTRAMUSCULAR | Status: AC
  Filled 2023-03-31: qty 5

## 2023-03-31 MED ORDER — CHLORHEXIDINE GLUCONATE CLOTH 2 % EX PADS: 6.0000 | MEDICATED_PAD | Freq: Once | CUTANEOUS | Status: DC

## 2023-03-31 MED ORDER — DEXAMETHASONE SODIUM PHOSPHATE 10 MG/ML IJ SOLN
INTRAMUSCULAR | Status: AC
  Filled 2023-03-31: qty 1

## 2023-03-31 MED ORDER — CLINDAMYCIN PHOSPHATE 900 MG/50ML IV SOLN
INTRAVENOUS | Status: AC
  Filled 2023-03-31: qty 50

## 2023-03-31 MED ORDER — CLINDAMYCIN PHOSPHATE 900 MG/50ML IV SOLN
900.0000 mg | INTRAVENOUS | Status: AC
  Administered 2023-03-31: 900 mg via INTRAVENOUS

## 2023-03-31 MED ORDER — PROPOFOL 10 MG/ML IV BOLUS
INTRAVENOUS | Status: AC
  Filled 2023-03-31: qty 20

## 2023-03-31 MED ORDER — ONDANSETRON HCL 4 MG/2ML IJ SOLN
INTRAMUSCULAR | Status: AC
  Filled 2023-03-31: qty 2

## 2023-03-31 MED ORDER — CHLORHEXIDINE GLUCONATE 0.12 % MT SOLN
OROMUCOSAL | Status: AC
  Filled 2023-03-31: qty 15

## 2023-03-31 MED ORDER — FENTANYL CITRATE (PF) 100 MCG/2ML IJ SOLN
INTRAMUSCULAR | Status: DC | PRN
  Administered 2023-03-31 (×5): 50 ug via INTRAVENOUS

## 2023-03-31 MED ORDER — MIDAZOLAM HCL 2 MG/2ML IJ SOLN
INTRAMUSCULAR | Status: AC
  Filled 2023-03-31: qty 2

## 2023-03-31 MED ORDER — MIDAZOLAM HCL 5 MG/5ML IJ SOLN
INTRAMUSCULAR | Status: DC | PRN
  Administered 2023-03-31 (×2): 1 mg via INTRAVENOUS

## 2023-03-31 MED ORDER — ONDANSETRON HCL 4 MG/2ML IJ SOLN: 4.0000 mg | Freq: Once | INTRAMUSCULAR | Status: DC | PRN

## 2023-03-31 MED ORDER — ACETAMINOPHEN 500 MG PO TABS: 1000.0000 mg | ORAL_TABLET | Freq: Four times a day (QID) | ORAL | 0 refills | Status: DC

## 2023-03-31 MED ORDER — BUPIVACAINE HCL (PF) 0.5 % IJ SOLN
INTRAMUSCULAR | Status: AC
  Filled 2023-03-31: qty 30

## 2023-03-31 MED ORDER — INDOCYANINE GREEN 25 MG IV SOLR
INTRAVENOUS | Status: AC
  Filled 2023-03-31: qty 10

## 2023-03-31 MED ORDER — LIDOCAINE HCL (PF) 2 % IJ SOLN
INTRAMUSCULAR | Status: AC
  Filled 2023-03-31: qty 5

## 2023-03-31 MED ORDER — DOCUSATE SODIUM 100 MG PO CAPS: 100.0000 mg | ORAL_CAPSULE | Freq: Two times a day (BID) | ORAL | 2 refills | Status: DC

## 2023-03-31 SURGICAL SUPPLY — 43 items
ADH SKN CLS APL DERMABOND .7 (GAUZE/BANDAGES/DRESSINGS) ×1
APL PRP STRL LF DISP 70% ISPRP (MISCELLANEOUS) ×1
BLADE SURG 15 STRL LF DISP TIS (BLADE) ×1 IMPLANT
BLADE SURG 15 STRL SS (BLADE) ×1
CAUTERY HOOK MNPLR 1.6 DVNC XI (INSTRUMENTS) ×1 IMPLANT
CHLORAPREP W/TINT 26 (MISCELLANEOUS) ×1 IMPLANT
CLIP LIGATING HEM O LOK PURPLE (MISCELLANEOUS) ×1 IMPLANT
COVER TIP SHEARS 8 DVNC (MISCELLANEOUS) IMPLANT
DEFOGGER SCOPE WARMER CLEARIFY (MISCELLANEOUS) IMPLANT
DERMABOND ADVANCED .7 DNX12 (GAUZE/BANDAGES/DRESSINGS) ×1 IMPLANT
DRAPE ARM DVNC X/XI (DISPOSABLE) ×4 IMPLANT
DRAPE COLUMN DVNC XI (DISPOSABLE) ×1 IMPLANT
ELECT REM PT RETURN 9FT ADLT (ELECTROSURGICAL) ×1
ELECTRODE REM PT RTRN 9FT ADLT (ELECTROSURGICAL) ×1 IMPLANT
FORCEPS BPLR R/ABLATION 8 DVNC (INSTRUMENTS) ×1 IMPLANT
FORCEPS PROGRASP DVNC XI (FORCEP) ×1 IMPLANT
GLOVE BIOGEL PI IND STRL 6.5 (GLOVE) ×2 IMPLANT
GLOVE BIOGEL PI IND STRL 7.0 (GLOVE) ×3 IMPLANT
GLOVE SURG SS PI 6.5 STRL IVOR (GLOVE) ×2 IMPLANT
GLOVE SURG SS PI 7.0 STRL IVOR (GLOVE) IMPLANT
GOWN STRL REUS W/TWL LRG LVL3 (GOWN DISPOSABLE) ×3 IMPLANT
GRASPER SUT TROCAR 14GX15 (MISCELLANEOUS) ×1 IMPLANT
KIT TURNOVER KIT A (KITS) ×1 IMPLANT
MANIFOLD NEPTUNE II (INSTRUMENTS) ×1 IMPLANT
NDL HYPO 21X1.5 SAFETY (NEEDLE) ×1 IMPLANT
NDL INSUFFLATION 14GA 120MM (NEEDLE) ×1 IMPLANT
NEEDLE HYPO 21X1.5 SAFETY (NEEDLE) ×1 IMPLANT
NEEDLE INSUFFLATION 14GA 120MM (NEEDLE) ×1 IMPLANT
OBTURATOR OPTICAL STND 8 DVNC (TROCAR) ×1
OBTURATOR OPTICALSTD 8 DVNC (TROCAR) ×1 IMPLANT
PACK LAP CHOLE LZT030E (CUSTOM PROCEDURE TRAY) ×1 IMPLANT
PAD ARMBOARD 7.5X6 YLW CONV (MISCELLANEOUS) ×1 IMPLANT
PENCIL HANDSWITCHING (ELECTRODE) IMPLANT
SCISSORS MNPLR CVD DVNC XI (INSTRUMENTS) IMPLANT
SEAL CANN UNIV 5-8 DVNC XI (MISCELLANEOUS) ×3 IMPLANT
SET BASIN LINEN APH (SET/KITS/TRAYS/PACK) ×1 IMPLANT
SET TUBE SMOKE EVAC HIGH FLOW (TUBING) ×1 IMPLANT
SUT MNCRL AB 4-0 PS2 18 (SUTURE) ×2 IMPLANT
SUT VICRYL 0 AB UR-6 (SUTURE) IMPLANT
SYR 30ML LL (SYRINGE) ×1 IMPLANT
SYS RETRIEVAL 5MM INZII UNIV (BASKET) ×1
SYSTEM RETRIEVL 5MM INZII UNIV (BASKET) IMPLANT
WATER STERILE IRR 500ML POUR (IV SOLUTION) ×1 IMPLANT

## 2023-03-31 NOTE — Anesthesia Preprocedure Evaluation (Signed)
Anesthesia Evaluation  Patient identified by MRN, date of birth, ID band Patient awake    Reviewed: Allergy & Precautions, H&P , NPO status , Patient's Chart, lab work & pertinent test results  Airway Mallampati: II  TM Distance: >3 FB Neck ROM: Full    Dental  (+) Dental Advisory Given, Chipped,    Pulmonary neg pulmonary ROS   Pulmonary exam normal breath sounds clear to auscultation       Cardiovascular Exercise Tolerance: Good hypertension, Pt. on medications Normal cardiovascular exam Rhythm:Regular Rate:Normal     Neuro/Psych  Headaches PSYCHIATRIC DISORDERS Anxiety Depression       GI/Hepatic Neg liver ROS,GERD  Medicated and Controlled,,  Endo/Other  diabetes (last dose of ozempic 02/2023), Well Controlled, Type 2, Oral Hypoglycemic Agents  Morbid obesity  Renal/GU negative Renal ROS  negative genitourinary   Musculoskeletal negative musculoskeletal ROS (+)    Abdominal   Peds negative pediatric ROS (+)  Hematology negative hematology ROS (+)   Anesthesia Other Findings   Reproductive/Obstetrics negative OB ROS                             Anesthesia Physical Anesthesia Plan  ASA: 3  Anesthesia Plan: General   Post-op Pain Management: Dilaudid IV   Induction: Intravenous  PONV Risk Score and Plan: 4 or greater and Ondansetron, Dexamethasone, Midazolam and Scopolamine patch - Pre-op  Airway Management Planned: Oral ETT  Additional Equipment:   Intra-op Plan:   Post-operative Plan: Extubation in OR  Informed Consent: I have reviewed the patients History and Physical, chart, labs and discussed the procedure including the risks, benefits and alternatives for the proposed anesthesia with the patient or authorized representative who has indicated his/her understanding and acceptance.     Dental advisory given  Plan Discussed with: CRNA and Surgeon  Anesthesia Plan  Comments:         Anesthesia Quick Evaluation

## 2023-03-31 NOTE — Transfer of Care (Signed)
Immediate Anesthesia Transfer of Care Note  Patient: Cindy Stevenson  Procedure(s) Performed: XI ROBOTIC ASSISTED LAPAROSCOPIC CHOLECYSTECTOMY (Abdomen)  Patient Location: PACU  Anesthesia Type:General  Level of Consciousness: awake  Airway & Oxygen Therapy: Patient Spontanous Breathing  Post-op Assessment: Report given to RN  Post vital signs: Reviewed and stable  Last Vitals:  Vitals Value Taken Time  BP 152/106 03/31/23 0938  Temp    Pulse 95 03/31/23 0942  Resp 16 03/31/23 0942  SpO2 100 % 03/31/23 0942  Vitals shown include unvalidated device data.  Last Pain:  Vitals:   03/31/23 0642  TempSrc: Oral  PainSc: 5       Patients Stated Pain Goal: 5 (03/31/23 1610)  Complications: No notable events documented.

## 2023-03-31 NOTE — Interval H&P Note (Signed)
History and Physical Interval Note:  03/31/2023 7:23 AM  Cindy Stevenson  has presented today for surgery, with the diagnosis of BILIARY COLIC.  The various methods of treatment have been discussed with the patient and family. After consideration of risks, benefits and other options for treatment, the patient has consented to  Procedure(s): XI ROBOTIC ASSISTED LAPAROSCOPIC CHOLECYSTECTOMY (N/A) as a surgical intervention.  The patient's history has been reviewed, patient examined, no change in status, stable for surgery.  I have reviewed the patient's chart and labs.  Questions were answered to the patient's satisfaction.     Nasira Janusz A Devian Bartolomei

## 2023-03-31 NOTE — Op Note (Signed)
[facility] Operative Note  03/31/23  Preoperative Diagnosis: Biliary colic   Postoperative Diagnosis: Same   Procedure(s) Performed: Robotic Assisted Laparoscopic Cholecystectomy   Surgeon: Theophilus Kinds, DO   Assistants: No qualified resident was available    Anesthesia: General endotracheal   Anesthesiologist: Molli Barrows, MD    Specimens: Gallbladder   Estimated Blood Loss: Minimal   Blood Replacement: None    Complications: None   Wound Class: Clean contaminated   Operative Indications: The patient was noted to have right upper quadrant abdominal pain without any imaging to suggest gallstones or abnormal gallbladder function.  Given her symptoms of biliary colic, decision was made to proceed with cholecystectomy to see if her symptoms improved.  We discussed the risk of the procedure including but not limited to bleeding, infection, injury to the common bile duct, bile leak, need for further procedures, chance of subtotal cholecystectomy.   Findings:  Noninflamed gallbladder Critical view of safety noted All clips intact at the end of the case Adequate hemostasis   Procedure: Firefly was given in the preoperative area. The patient was taken to the operating room and placed supine. General endotracheal anesthesia was induced. Intravenous antibiotics were  administered per protocol.  An orogastric tube positioned to decompress the stomach. The abdomen was prepared and draped in the usual sterile fashion. A time-out was completed verifying correct patient, procedure, site, positioning, and implant(s) and/or special equipment prior to beginning this procedure.  Veress needle was placed at the infraumbilical area and insufflation was started after confirming a positive saline drop test and no immediate increase in abdominal pressure.  After reaching 15 mm, the Veress needle was removed and a 8 mm port was placed via optiview technique  infraumbilical, measuring 20 mm away from the suspected position of the gallbladder.  The abdomen was inspected and no abnormalities or injuries were found.  Under direct vision, ports were placed in the following locations in a semi curvilinear position around the target of the gallbladder: Two 8 mm ports on the patient's right each having 8cm clearance to the adjacent ports and one 8 mm port placed on the patient's left 8 cm from the umbilical port. Once ports were placed, the table was placed in the reverse Trendelenburg position with the right side up. The Xi platform was brought into the operative field and docked to the ports successfully.  An endoscope was placed through the umbilical port, prograsp through the most lateral right port, fenestrated bipolar to the port just right of the umbilicus, and then a hook cautery in the left port.  The dome of the gallbladder was grasped with prograsp and retracted over the dome of the liver. Adhesions between the gallbladder and omentum, duodenum and transverse colon were lysed via hook cautery. The infundibulum was grasped with the fenestrated grasper and retracted toward the right lower quadrant. This maneuver exposed Calot's triangle. Firefly was used throughout the dissection to ensure safe visualization of the cystic duct.  The peritoneum overlying the gallbladder infundibulum was then dissected and the cystic duct and cystic artery identified.  Critical view of safety with the liver bed clearly visible behind the duct and artery with no additional structures noted.  The cystic duct and cystic artery were doubly clipped and divided close to the gallbladder.    The gallbladder was then dissected from its peritoneal and liver bed attachments by electrocautery. Hemostasis was checked prior to removing the hook cautery.  The [employer] was undocked and moved out  of the field.  A 5mm Endo Catch bag was then placed through the umbilical port and the gallbladder was removed.   The gallbladder was passed off the table as a specimen. There was no evidence of bleeding from the gallbladder fossa or cystic artery or leakage of the bile from the cystic duct stump. The umbilical port site closed with a 0 vicryl with a PMI needle.  The abdomen was desufflated and secondary trocars were removed under direct vision. No bleeding was noted. All skin incisions were closed with subcuticular sutures of 4-0 monocryl and dermabond.   Final inspection revealed acceptable hemostasis. All counts were correct at the end of the case. The patient was awakened from anesthesia and extubated without complication. The OG tube was removed.  The patient went to the PACU in stable condition.   Theophilus Kinds, DO [facility] [address] [place], [place] [address] [phone] (office)

## 2023-03-31 NOTE — Anesthesia Procedure Notes (Signed)
Procedure Name: Intubation Date/Time: 03/31/2023 7:54 AM  Performed by: Moshe Salisbury, CRNAPre-anesthesia Checklist: Patient identified, Patient being monitored, Timeout performed, Emergency Drugs available and Suction available Patient Re-evaluated:Patient Re-evaluated prior to induction Oxygen Delivery Method: Circle system utilized Preoxygenation: Pre-oxygenation with 100% oxygen Induction Type: IV induction Ventilation: Mask ventilation without difficulty Laryngoscope Size: Mac and 3 Grade View: Grade I Tube type: Oral Tube size: 7.0 mm Number of attempts: 1 Airway Equipment and Method: Stylet Placement Confirmation: ETT inserted through vocal cords under direct vision, positive ETCO2 and breath sounds checked- equal and bilateral Secured at: 21 cm Tube secured with: Tape Dental Injury: Teeth and Oropharynx as per pre-operative assessment

## 2023-03-31 NOTE — Anesthesia Postprocedure Evaluation (Signed)
Anesthesia Post Note  Patient: Cindy Stevenson  Procedure(s) Performed: XI ROBOTIC ASSISTED LAPAROSCOPIC CHOLECYSTECTOMY (Abdomen)  Patient location during evaluation: Phase II Anesthesia Type: General Level of consciousness: awake and alert and oriented Pain management: pain level controlled Vital Signs Assessment: post-procedure vital signs reviewed and stable Respiratory status: spontaneous breathing, nonlabored ventilation and respiratory function stable Cardiovascular status: blood pressure returned to baseline and stable Postop Assessment: no apparent nausea or vomiting Anesthetic complications: no  No notable events documented.   Last Vitals:  Vitals:   03/31/23 1015 03/31/23 1028  BP: (!) 141/100 (!) 140/89  Pulse: 100 (!) 102  Resp: 17 (!) 23  Temp:  36.7 C  SpO2: 93% 95%    Last Pain:  Vitals:   03/31/23 1028  TempSrc: Oral  PainSc: 4                  Sunjai Levandoski C Camdynn Maranto

## 2023-03-31 NOTE — Discharge Instructions (Signed)
Ambulatory Surgery Discharge Instructions  General Anesthesia or Sedation Do not drive or operate heavy machinery for 24 hours.  Do not consume alcohol, tranquilizers, sleeping medications, or any non-prescribed medications for 24 hours. Do not make important decisions or sign any important papers in the next 24 hours. You should have someone with you tonight at home.  Activity  You are advised to go directly home from the hospital.  Restrict your activities and rest for a day.  Resume light activity tomorrow. No heavy lifting over 10 lbs or strenuous exercise.  Fluids and Diet Begin with clear liquids, bouillon, dry toast, soda crackers.  If not nauseated, you may go to a regular diet when you desire.  Greasy and spicy foods are not advised.  Medications  If you have not had a bowel movement in 24 hours, take 2 tablespoons over the counter Milk of mag.             You May resume your blood thinners tomorrow (Aspirin, coumadin, or other).  You are being discharged with prescriptions for Opioid/Narcotic Medications: There are some specific considerations for these medications that you should know. Opioid Meds have risks & benefits. Addiction to these meds is always a concern with prolonged use Take medication only as directed Do not drive while taking narcotic pain medication Do not crush tablets or capsules Do not use a different container than medication was dispensed in Lock the container of medication in a cool, dry place out of reach of children and pets. Opioid medication can cause addiction Do not share with anyone else (this is a felony) Do not store medications for future use. Dispose of them properly.     Disposal:  Find a [place] household drug take back site near you.  If you can't get to a drug take back site, use the recipe below as a last resort to dispose of expired, unused or unwanted drugs. Disposal  (Do not dispose chemotherapy drugs this way, talk to your  prescribing doctor instead.) Step 1: Mix drugs (do not crush) with dirt, kitty litter, or used coffee grounds and add a small amount of water to dissolve any solid medications. Step 2: Seal drugs in plastic bag. Step 3: Place plastic bag in trash. Step 4: Take prescription container and scratch out personal information, then recycle or throw away.  Operative Site  You have a liquid bandage over your incisions, this will begin to flake off in about a week. Ok to shower tomorrow. Keep wound clean and dry. No baths or swimming. No lifting more than 10 pounds.  Contact Information: If you have questions or concerns, please call our office, [phone], Monday- Thursday 8AM-5PM and Friday 8AM-12Noon.  If it is after hours or on the weekend, please call [facility], [phone], and ask to speak to the surgeon on call for Dr. Madicyn Mesina at [facility].   SPECIFIC COMPLICATIONS TO WATCH FOR: Inability to urinate Fever over 101? F by mouth Nausea and vomiting lasting longer than 24 hours. Pain not relieved by medication ordered Swelling around the operative site Increased redness, warmth, hardness, around operative area Numbness, tingling, or cold fingers or toes Blood -soaked dressing, (small amounts of oozing may be normal) Increasing and progressive drainage from surgical area or exam site  

## 2023-03-31 NOTE — Progress Notes (Signed)
[facility]  Spoke with the patient's sister and friend in the consultation room.  I explained that she tolerated the procedure without difficulty.  She has dissolvable stitches under the skin with overlying skin glue.  This will flake off in 10 to 14 days.  I discharged her home with a prescription for narcotic pain medication that they should take as needed for pain.  I also want her taking scheduled Tylenol.  If they take the narcotic pain medication, they should take a stool softener as well.  The patient will follow-up with me in 2 weeks for phone follow-up.  All questions were answered to their expressed satisfaction.  Theophilus Kinds, DO [facility] [address] [place], [place] [address] [phone] (office)

## 2023-04-03 LAB — SURGICAL PATHOLOGY

## 2023-04-04 ENCOUNTER — Other Ambulatory Visit: Payer: Self-pay | Admitting: Surgery

## 2023-04-04 ENCOUNTER — Telehealth: Payer: Self-pay | Admitting: *Deleted

## 2023-04-04 NOTE — Telephone Encounter (Signed)
Surgical Date: 03/31/2023 Procedure: XI ROBOTIC ASSISTED LAPAROSCOPIC CHOLECYSTECTOMY   Received call from patient [phone]~ telephone.  Patient reports increased pain in navel incision. States that she is taking APAP/ IBU as directed. States that she is up and walking, and is icing incision.   Requesting refill on Oxycodone to [facility]. Last prescription given 03/31/2023, #16 tabs.   Please advise.

## 2023-04-05 NOTE — Telephone Encounter (Signed)
Provider made aware and refill sent to pharmacy.

## 2023-04-13 ENCOUNTER — Ambulatory Visit: Payer: Medicaid Other | Admitting: Gastroenterology

## 2023-04-13 ENCOUNTER — Encounter: Payer: Medicaid Other | Admitting: Surgery

## 2023-04-13 ENCOUNTER — Ambulatory Visit (INDEPENDENT_AMBULATORY_CARE_PROVIDER_SITE_OTHER): Payer: Medicaid Other | Admitting: Surgery

## 2023-04-13 ENCOUNTER — Encounter: Payer: Self-pay | Admitting: Gastroenterology

## 2023-04-13 VITALS — BP 129/88 | HR 96 | Temp 97.8°F | Ht 61.0 in | Wt 233.2 lb

## 2023-04-13 DIAGNOSIS — K642 Third degree hemorrhoids: Secondary | ICD-10-CM

## 2023-04-13 DIAGNOSIS — Z09 Encounter for follow-up examination after completed treatment for conditions other than malignant neoplasm: Secondary | ICD-10-CM

## 2023-04-13 NOTE — Progress Notes (Signed)
[facility]  I am calling the patient for post operative evaluation. This is not a billable encounter as it is under the global charges for the surgery.  The patient had a robotic assisted laparoscopic cholecystectomy on 6/14. The patient reports that she is doing well. The are tolerating a diet, and having regular Bms.  The incisions are healing well with a small amount of skin glue still in place.  Advised that she can use antibiotic ointment prior to showering to help remove remaining skin glue.  We discussed her pathology.  She does state that she has a little bit of pain along her right costal margin which will randomly come on.  Nothing seems to make the pain better or worse.  She also has some intermittent nausea and vomiting when she has abdominal pain.  I explained that this is all to be expected in the postoperative period.  I will have a phone follow-up with the patient in an additional couple of weeks to see stashed she is doing at that time.  We did discuss that some of her initial pain may not have been related to gallbladder disease, in which case cholecystectomy would not improve those symptoms.  I advised that we will see how she is doing in a few weeks and make further decisions about treatment at that time.  All questions were answered to her expressed satisfaction.  Pathology: A. GALLBLADDER, CHOLECYSTECTOMY:  -  Chronic cholecystitis.   Will see the patient PRN.   Theophilus Kinds, DO [facility] [address] [place], [place] [address] [phone] (office)

## 2023-04-13 NOTE — Patient Instructions (Signed)
  Please avoid straining.  You should limit your toilet time to 2-3 minutes at the most.   I recommend Benefiber 2 teaspoons each morning in the beverage of your choice!  Please call me with any concerns or issues!  I will see you in follow-up in 4 weeks!  I enjoyed seeing you again today! I value our relationship and want to provide genuine, compassionate, and quality care. You may receive a survey regarding your visit with me, and I welcome your feedback! Thanks so much for taking the time to complete this. I look forward to seeing you again.      Gelene Mink, PhD, ANP-BC [facility]

## 2023-04-13 NOTE — Progress Notes (Signed)
    [facility] BANDING PROCEDURE NOTE  Cindy Stevenson is a 47 y.o. female presenting today for consideration of hemorrhoid banding. Last colonoscopy 2023 with internal hemorrhoids. Has had bleeding, itching, pressure in the past . She has had left lateral banding, right posterior banding, and right anterior banding. She has undergone cholecystectomy in interim from last visit. Still with some bleeding.    The patient presents with symptomatic grade 3 hemorrhoids, unresponsive to maximal medical therapy, requesting rubber band ligation of her hemorrhoidal disease. All risks, benefits, and alternative forms of therapy were described and informed consent was obtained.   The decision was made to band neutrally using latex-free bands, and the [facility] O'Regan System was used to perform band ligation without complication. Digital anorectal examination was then performed to assure proper positioning of the band, and to adjust the banded tissue as required. The patient was discharged home without pain or other issues. Dietary and behavioral recommendations were given, along with follow-up instructions. The patient will return in several weeks for followup and possible additional banding as required.  No complications were encountered and the patient tolerated the procedure well.   Gelene Mink, PhD, ANP-BC [facility]

## 2023-04-27 ENCOUNTER — Ambulatory Visit (INDEPENDENT_AMBULATORY_CARE_PROVIDER_SITE_OTHER): Payer: Medicaid Other | Admitting: Surgery

## 2023-04-27 ENCOUNTER — Encounter (HOSPITAL_COMMUNITY)
Admission: RE | Admit: 2023-04-27 | Discharge: 2023-04-27 | Disposition: A | Discharge status: Home / Self Care | Payer: Medicaid Other | Source: Ambulatory Visit | Attending: Surgery | Admitting: Surgery

## 2023-04-27 VITALS — BP 115/72 | HR 92 | Temp 97.7°F | Resp 20

## 2023-04-27 DIAGNOSIS — Z09 Encounter for follow-up examination after completed treatment for conditions other than malignant neoplasm: Secondary | ICD-10-CM

## 2023-04-27 DIAGNOSIS — G43019 Migraine without aura, intractable, without status migrainosus: Secondary | ICD-10-CM | POA: Diagnosis not present

## 2023-04-27 MED ORDER — SODIUM CHLORIDE 0.9 % IV SOLN
300.0000 mg | Freq: Once | INTRAVENOUS | Status: AC
  Administered 2023-04-27: 300 mg via INTRAVENOUS
  Filled 2023-04-27: qty 3

## 2023-04-27 NOTE — Progress Notes (Signed)
Diagnosis: Migraine  Provider:  Chilton Greathouse MD  Procedure: Infusion  IV Type: Peripheral, IV Location: R Hand  Vyepti (Eptinezumab-jjmr), Dose: 300 mg  Infusion Start Time: 1457 pm  Infusion Stop Time: 1534 pm  Post Infusion IV Care: Observation period completed and Peripheral IV Discontinued  Discharge: Condition: Good, Destination: Home . AVS Declined  Performed by:  Arrie Senate, RN

## 2023-04-28 NOTE — Progress Notes (Signed)
[facility]  I am calling the patient for post operative evaluation. This is not a billable encounter as it is under the global charges for the surgery.  The patient had a robotic assisted laparoscopic cholecystectomy on 6/14.  She is doing significantly better since her last telephone encounter.  She is learning what foods cause diarrhea for her.  Her abdominal pain is significantly better.  She still has intermittent nausea which is random and come and goes.  She has a scheduled follow-up next month with her GI.  I explained to her that if her symptoms fail to improve, she should call my office for further evaluation.  All questions were answered to her expressed satisfaction.  Will see the patient PRN.   Theophilus Kinds, DO [facility] [address] [place], [place] [address] [phone] (office)

## 2023-05-02 ENCOUNTER — Telehealth: Payer: Self-pay | Admitting: *Deleted

## 2023-05-02 NOTE — Telephone Encounter (Signed)
Surgical Date: 03/31/2023 Procedure: XI ROBOTIC ASSISTED LAPAROSCOPIC CHOLECYSTECTOMY   Received call from patient [phone]~ telephone.   Patient reports that she has noted slight opening to incision at edge of navel. Reports that there is slight clear to yellow drainage from opening that crusted overnight.   Advised that some incisions will reopen. Advised that area will eventually heal.   Denies fever, chills, pain, tenderness, warmth to touch, etc. States that she does have some burning/ stinging when sweat touches open area.   Advised to cleanse area with warm soapy water and pat dry. Advised to cover with bandage to avoid leaking onto clothing or irritation from sweat.   Advised to monitor for S/ Sx of infection.

## 2023-05-03 ENCOUNTER — Ambulatory Visit: Payer: Medicaid Other | Admitting: Psychiatry

## 2023-05-03 ENCOUNTER — Encounter: Payer: Self-pay | Admitting: Psychiatry

## 2023-05-03 ENCOUNTER — Other Ambulatory Visit: Payer: Self-pay

## 2023-05-03 VITALS — BP 134/88 | HR 141 | Ht 61.0 in | Wt 228.0 lb

## 2023-05-03 DIAGNOSIS — M5481 Occipital neuralgia: Secondary | ICD-10-CM | POA: Diagnosis not present

## 2023-05-03 DIAGNOSIS — M542 Cervicalgia: Secondary | ICD-10-CM

## 2023-05-03 MED ORDER — BUPIVACAINE HCL (PF) 0.25 % IJ SOLN
8.0000 mL | Freq: Once | INTRAMUSCULAR | Status: AC
Start: 2023-05-03 — End: 2023-05-03
  Administered 2023-05-03: 8 mL

## 2023-05-03 MED ORDER — BETAMETHASONE SOD PHOS & ACET 6 (3-3) MG/ML IJ SUSP
6.0000 mg | Freq: Once | INTRAMUSCULAR | Status: AC
Start: 2023-05-03 — End: 2023-05-03
  Administered 2023-05-03: 6 mg via INTRAMUSCULAR

## 2023-05-03 NOTE — Progress Notes (Signed)
Procedure: Occipital Nerve injection/Trigger point injection  Location: bilateral occiput  The risks, benefits and anticipated outcomes of the procedure, the risks and benefits of the alternatives to the procedure, and the roles and tasks of the personnel to be involved, were discussed with the patient, and the patient consents to the procedure and agrees to proceed.     A combination of 1 cc betamethasone 6 mg and 4 cc of 0.25% bupivacaine were prepared in 2 syringes (5 cc).  2 trigger points on the splenius capitus were identified and injected. The left and right greater occipital nerves were injected 3cm caudal and 1.5 cm lateral to the inion where the main trunk of the occipital nerve penetrates the semispinalis muscle.  The needle was placed perpendicular and the needle advanced 1.5 cm. After aspiration to ensure no obstruction or presence of blood, the area was injected.  The needle was repositioned in a fan-like manner and the entire area was injected. Pressure was held and no hematoma was noted.   Ocie Doyne, MD 05/03/23 1:19 PM

## 2023-05-05 ENCOUNTER — Encounter: Payer: Self-pay | Admitting: Psychiatry

## 2023-05-07 ENCOUNTER — Other Ambulatory Visit (HOSPITAL_COMMUNITY): Payer: Self-pay

## 2023-05-07 ENCOUNTER — Telehealth: Payer: Self-pay

## 2023-05-07 NOTE — Telephone Encounter (Signed)
I received a PA request via CMM-however when I submitted it states that no PA is required.

## 2023-05-08 ENCOUNTER — Other Ambulatory Visit (HOSPITAL_COMMUNITY): Payer: Self-pay

## 2023-05-08 NOTE — Telephone Encounter (Signed)
Replied to original mychart msg to pt to keep her informed

## 2023-05-08 NOTE — Telephone Encounter (Signed)
   I ran it for 9 tablets instead of 12 and I get a successful test claim.

## 2023-05-08 NOTE — Telephone Encounter (Signed)
Received call from patient [phone]~ telephone.   Reports that umbilical incision opened further and is draining slight yellowish fluid. States that she was seen at urgent care and advised to use ABTx ointment and to cover with tape.   Reports that she has also has noted hard areas under incision that she would like to have checked.   Appointment scheduled with provider on 05/10/2023.

## 2023-05-08 NOTE — Telephone Encounter (Signed)
Pt sent my chart message saying:   "I tried to refill the naratriptan but the pharmacy says that I have exceeded my plan limit? They said they sent a PA to your office. I'm not sure what they mean? "   Thank you!  Cindy Stevenson

## 2023-05-09 ENCOUNTER — Ambulatory Visit: Payer: Medicaid Other | Admitting: Psychiatry

## 2023-05-10 ENCOUNTER — Ambulatory Visit (INDEPENDENT_AMBULATORY_CARE_PROVIDER_SITE_OTHER): Payer: Medicaid Other | Admitting: Surgery

## 2023-05-10 ENCOUNTER — Encounter: Payer: Self-pay | Admitting: Surgery

## 2023-05-10 VITALS — BP 174/80 | HR 98 | Temp 98.1°F | Resp 16 | Ht 61.0 in | Wt 232.0 lb

## 2023-05-10 DIAGNOSIS — Z09 Encounter for follow-up examination after completed treatment for conditions other than malignant neoplasm: Secondary | ICD-10-CM

## 2023-05-11 ENCOUNTER — Ambulatory Visit: Payer: Medicaid Other | Admitting: Psychiatry

## 2023-05-12 NOTE — Progress Notes (Signed)
[facility] Note   HPI:  47 y.o. Female presents to clinic for post-op follow-up status post robotic assisted laparoscopic cholecystectomy on 6/14.  She was with her friend, who was lifting a TV, and started to drop the TV.  She attempted to help catch the TV, but when she did this, she felt a popping sensation at her umbilical incision site.  She noted that the incision had opened a small amount and had some yellowish clear drainage.  She denies any fevers or chills.  She went to an urgent care, who recommended that she put antibiotic ointment at the incision site and keep it covered.  She otherwise has been doing okay.  She denies nausea, vomiting, and other abdominal pain.  Denies fevers and chills.  Review of Systems:  All other review of systems: otherwise negative   Vital Signs:  BP (!) 174/80   Pulse 98   Temp 98.1 F (36.7 C) (Oral)   Resp 16   Ht 5\' 1"  (1.549 m)   Wt 232 lb (105.2 kg)   LMP 04/21/2017 Comment: SCH  SpO2 95%   BMI 43.84 kg/m    Physical Exam:  Physical Exam Vitals reviewed.  Constitutional:      Appearance: Normal appearance.  Abdominal:     Comments: Abdomen soft, nondistended, no percussion tenderness, nontender to palpation; no rigidity, guarding, rebound tenderness; incision sites healing well, umbilical incision site with small scab; mild palpable fullness underneath umbilical incision, likely scar tissue  Neurological:     Mental Status: She is alert.     Laboratory studies: None  Imaging:  None  Assessment:  47 y.o. yo Female who presents for follow-up status post robotic assisted laparoscopic cholecystectomy on 6/14  Plan:  -I advised her that all of her incision sites are healing well.  She should continue with antibiotic ointment over her umbilical incision until this adequately heals -I further advised her that the fullness at her incision site is likely scar tissue.  Since she did feel a popping sensation, there is a  small chance that she could have developed a hernia and her fascial stitch popped.  Given that she is not having significant symptoms at this time, I advised that we can just monitor her and see how she is doing in the future.  If this area does not soften up over the next 4 to 6 months, then we could evaluate her for hernia -Follow up as needed  All of the above recommendations were discussed with the patient, and all of patient's questions were answered to her expressed satisfaction.  Theophilus Kinds, DO [facility] [address] [place], [place] [address] [phone] (office)

## 2023-05-15 ENCOUNTER — Other Ambulatory Visit: Payer: Self-pay | Admitting: Psychiatry

## 2023-05-16 ENCOUNTER — Encounter: Payer: Self-pay | Admitting: Psychiatry

## 2023-05-16 ENCOUNTER — Ambulatory Visit: Payer: Medicaid Other | Admitting: Gastroenterology

## 2023-05-17 ENCOUNTER — Encounter: Payer: Self-pay | Admitting: Emergency Medicine

## 2023-05-24 NOTE — Telephone Encounter (Signed)
Dena: can we find out which dosage she is referring to and provide samples? I sent Mychart so when she responds, we can provide samples of lower dosage.

## 2023-05-26 ENCOUNTER — Other Ambulatory Visit: Payer: Self-pay

## 2023-06-05 ENCOUNTER — Institutional Professional Consult (permissible substitution): Payer: Medicaid Other | Admitting: Adult Health

## 2023-06-06 ENCOUNTER — Ambulatory Visit (INDEPENDENT_AMBULATORY_CARE_PROVIDER_SITE_OTHER): Payer: Medicaid Other | Admitting: Gastroenterology

## 2023-06-06 ENCOUNTER — Encounter: Payer: Self-pay | Admitting: Gastroenterology

## 2023-06-06 VITALS — BP 141/87 | HR 102 | Temp 98.6°F | Ht 61.0 in | Wt 236.2 lb

## 2023-06-06 DIAGNOSIS — K5900 Constipation, unspecified: Secondary | ICD-10-CM

## 2023-06-06 NOTE — Patient Instructions (Signed)
Let's try the lower dosage of Linzess!  Message me if any concerns!  We will see you in 3 months!  Happy Birthday!!  I enjoyed seeing you again today! I value our relationship and want to provide genuine, compassionate, and quality care. You may receive a survey regarding your visit with me, and I welcome your feedback! Thanks so much for taking the time to complete this. I look forward to seeing you again.      Gelene Mink, PhD, ANP-BC [facility]

## 2023-06-06 NOTE — Progress Notes (Signed)
Gastroenterology Office Note     Primary Care Physician:  Sheela Stack  Primary Gastroenterologist: Dr. Marletta Lor    Chief Complaint   Chief Complaint  Patient presents with   Follow-up    Follow up on hemorrhoids     History of Present Illness   Cindy Stevenson is a 47 y.o. female presenting today with a history of internal hemorrhoids exacerbated by constipation, s/p banding of all 3 columns and neutrally. Recently underwent cholecystectomy.   After cholecystectomy lifted a heavy TV and had lower abdominal pain thereafter. Felt may have popped a stitch in the inside. Surgery aware.   Not taking Ozempic currently.    When taking Linzess each morning, will go 30 minutes later and have straight liquid. When on ozempic will be more bound up. Not on ozempic.    Had an episode of bleeding but believes had been wiping too hard.    Past Medical History:  Diagnosis Date   Anxiety    Breast nodule 08/06/2015   Breast pain, left 08/06/2015   Burning with urination 02/27/2014   Common migraine with intractable migraine 06/11/2018   Depression    Diabetes mellitus without complication (HCC)    Dysmenorrhea 02/27/2014   Fibroadenoma of right breast 08/26/2015   GERD (gastroesophageal reflux disease)    Headache    Hematuria 02/27/2014   Hypertension    PTSD (post-traumatic stress disorder)    Screening for STD (sexually transmitted disease) 03/27/2014   Seasonal allergies    SUI (stress urinary incontinence, female) 02/27/2014   Vaginal discharge 08/06/2015   Warts, genital 01/23/2015   Yeast infection 08/06/2015    Past Surgical History:  Procedure Laterality Date   BALLOON DILATION N/A 03/24/2022   Procedure: BALLOON DILATION;  Surgeon: Lanelle Bal, DO;  Location: [facility];  Service: Endoscopy;  Laterality: N/A;   BILATERAL SALPINGECTOMY Bilateral 05/09/2017   Procedure: BILATERAL SALPINGECTOMY;  Surgeon: [name], MD;  Location: [facility];   Service: Gynecology;  Laterality: Bilateral;   COLONOSCOPY N/A 09/10/2013   ION:GEXBM;/WUXLK internal hemorrhoids   COLONOSCOPY WITH PROPOFOL N/A 03/24/2022   Surgeon: Lanelle Bal, DO;   Hemorrhoids on perianal exam, internal hemorrhoids, otherwise normal exam.  Repeat in 10 years.   ESOPHAGOGASTRODUODENOSCOPY N/A 09/10/2013   GMW:NUUVOZDG ring at the gastroesophagral juctions/mild non-erosive gastritis   ESOPHAGOGASTRODUODENOSCOPY  2017   Dr. Teena Dunk; Schatzki's ring in the distal esophagus, otherwise normal exam.   ESOPHAGOGASTRODUODENOSCOPY (EGD) WITH PROPOFOL N/A 03/24/2022   Surgeon: Lanelle Bal, DO;   Mild Schatzki's ring dilated, 2 gastric polyps removed, otherwise normal exam. Polyps were hyperplastic.   LABIOPLASTY  02/14/2012   Procedure: LABIAPLASTY;  Surgeon: [name], MD;  Location: [facility];  Service: Gynecology;  Laterality: Right;  of the right labia minora   POLYPECTOMY  03/24/2022   Procedure: POLYPECTOMY;  Surgeon: Lanelle Bal, DO;  Location: [facility];  Service: Endoscopy;;   SUPRACERVICAL ABDOMINAL HYSTERECTOMY N/A 05/09/2017   Procedure: HYSTERECTOMY SUPRACERVICAL ABDOMINAL;  Surgeon: [name], MD;  Location: [facility];  Service: Gynecology;  Laterality: N/A;   WISDOM TOOTH EXTRACTION  10/18/1995   Dr. Manson Passey    Current Outpatient Medications  Medication Sig Dispense Refill   ACCU-CHEK GUIDE test strip      Accu-Chek Softclix Lancets lancets SMARTSIG:Topical     acetaminophen (TYLENOL) 500 MG tablet Take 2 tablets (1,000 mg total) by mouth every 6 (six) hours. 30 tablet 0  albuterol (VENTOLIN HFA) 108 (90 Base) MCG/ACT inhaler Inhale 2 puffs into the lungs every 6 (six) hours as needed for shortness of breath or wheezing.     ALPRAZolam (XANAX) 1 MG tablet Take 1 mg by mouth 3 (three) times daily.     baclofen (LIORESAL) 10 MG tablet TAKE 1/2 TO 1 TABLET BY MOUTH THREE TIMES DAILY AS NEEDED FOR MUSCLE SPASMS 90 tablet 3   Bismuth  Subsalicylate (PEPTO-BISMOL) 262 MG TABS Take 262 mg by mouth daily as needed (indigestion).     cetirizine (ZYRTEC) 10 MG tablet Take 10 mg by mouth daily.     Diclofenac Potassium,Migraine, 50 MG PACK take 50-100 MG AS NEEDED FOR migraine. max DOSE of 100mg  in 24 hours 15 each 6   docusate sodium (COLACE) 100 MG capsule Take 1 capsule (100 mg total) by mouth 2 (two) times daily. 60 capsule 2   Eptinezumab-jjmr (VYEPTI) 100 MG/ML injection Inject 300 mg into the vein every 3 (three) months. Getting at [facility] [phone], fax [phone]     esomeprazole (NEXIUM) 40 MG capsule Take 40 mg by mouth 2 (two) times daily.      ezetimibe (ZETIA) 10 MG tablet Take 10 mg by mouth daily.     gabapentin (NEURONTIN) 400 MG capsule Take 400 mg by mouth 4 (four) times daily.     hydrochlorothiazide (HYDRODIURIL) 25 MG tablet Take 25 mg by mouth daily.     ibuprofen (ADVIL) 600 MG tablet Take 600 mg by mouth every 8 (eight) hours as needed for headache.     labetalol (NORMODYNE) 100 MG tablet Take 50 mg by mouth 2 (two) times daily.     linaclotide (LINZESS) 145 MCG CAPS capsule TAKE ONE CAPSULE BY MOUTH EVERY MORNING BEFORE breakfast 90 capsule 3   melatonin 5 MG TABS Take 10 mg by mouth at bedtime.     mupirocin ointment (BACTROBAN) 2 % Apply 1 Application topically 3 (three) times daily.     naratriptan (AMERGE) 2.5 MG tablet Take 1 tablet (2.5 mg total) by mouth as needed for migraine. Take one (1) tablet at onset of headache; if returns or does not resolve, may repeat after 4 hours; do not exceed five (5) mg in 24 hours. 12 tablet 6   nystatin (MYCOSTATIN/NYSTOP) powder Apply topically.     ondansetron (ZOFRAN) 8 MG tablet Take 8 mg by mouth every 8 (eight) hours as needed for nausea.     Polyethyl Glycol-Propyl Glycol (SYSTANE OP) Place 1 drop into both eyes daily as needed (dry eyes).     PROCTO-MED HC 2.5 % rectal cream APPLY 1 GRAM RECTALLY TWICE DAILY (Patient taking differently: Place 1  Application rectally 2 (two) times daily as needed for hemorrhoids.) 30 g 1   Semaglutide,0.25 or 0.5MG /DOS, (OZEMPIC, 0.25 OR 0.5 MG/DOSE,) 2 MG/3ML SOPN Inject 0.25 mg into the skin once a week. (Patient not taking: Reported on 06/06/2023)     No current facility-administered medications for this visit.    Allergies as of 06/06/2023 - Review Complete 06/06/2023  Allergen Reaction Noted   Effexor [venlafaxine] Rash and Hypertension 07/12/2021   Latex Dermatitis 05/09/2016   Codeine Nausea And Vomiting 02/13/2012   Shellfish allergy Hives 02/13/2012   Sulfa antibiotics Swelling 07/18/2022   Tape  03/27/2023   Amoxicillin Rash 01/23/2015   Ceftin [cefuroxime] Rash 12/09/2020    Family History  Problem Relation Age of Onset   Lung cancer Mother    Asthma Sister    Allergies  Sister    Mental illness Maternal Aunt        depression, PTSD   Diabetes Maternal Grandmother    Cancer Maternal Grandfather        skin   Colon cancer Neg Hx     Social History   Socioeconomic History   Marital status: Single    Spouse name: Not on file   Number of children: Not on file   Years of education: Not on file   Highest education level: Not on file  Occupational History   Occupation: disability    Employer: NOT EMPLOYED  Tobacco Use   Smoking status: Never   Smokeless tobacco: Never  Vaping Use   Vaping status: Never Used  Substance and Sexual Activity   Alcohol use: No   Drug use: No   Sexual activity: Not Currently    Birth control/protection: Surgical    Comment: [facility]  Other Topics Concern   Not on file  Social History Narrative   Not on file   Social Determinants of Health   Financial Resource Strain: Low Risk  (11/24/2021)   Received from [facility], [facility]   Overall Financial Resource Strain (CARDIA)    Difficulty of Paying Living Expenses: Not hard at all  Food Insecurity: No Food Insecurity (11/24/2021)   Received from [facility], [facility]    Hunger Vital Sign    Worried About Running Out of Food in the Last Year: Never true    Ran Out of Food in the Last Year: Never true  Transportation Needs: No Transportation Needs (11/24/2021)   Received from [facility], [facility]   [facility] - Transportation    Lack of Transportation (Medical): No    Lack of Transportation (Non-Medical): No  Physical Activity: Inactive (08/09/2021)   Received from [facility], [facility]   Exercise Vital Sign    Days of Exercise per Week: 0 days    Minutes of Exercise per Session: 0 min  Stress: Stress Concern Present (08/09/2021)   Received from [facility], [facility] of Occupational Health - Occupational Stress Questionnaire    Feeling of Stress : Very much  Social Connections: Unknown (02/15/2022)   Received from [facility]   Social Network    Social Network: Not on file  Recent Concern: Social Connections - Socially Isolated (11/24/2021)   Received from [facility], [facility]   Social Connection and Isolation Panel [NHANES]    Frequency of Communication with Friends and Family: More than three times a week    Frequency of Social Gatherings with Friends and Family: Once a week    Attends Religious Services: Never    Database administrator or Organizations: No    Attends Banker Meetings: Never    Marital Status: Never married  Intimate Partner Violence: Not At Risk (05/30/2022)   Received from [facility], [facility]   Humiliation, Afraid, Rape, and Kick questionnaire    Fear of Current or Ex-Partner: No    Emotionally Abused: No    Physically Abused: No    Sexually Abused: No     Review of Systems   Gen: Denies any fever, chills, fatigue, weight loss, lack of appetite.  CV: Denies chest pain, heart palpitations, peripheral edema, syncope.  Resp: Denies shortness of breath at rest or with exertion. Denies wheezing or cough.  GI: Denies dysphagia or odynophagia.  Denies jaundice, hematemesis, fecal incontinence. GU : Denies urinary burning, urinary frequency, urinary hesitancy MS: Denies joint pain, muscle weakness, cramps, or limitation of movement.  Derm: Denies rash, itching, dry skin Psych: Denies depression, anxiety, memory loss, and confusion Heme: Denies bruising, bleeding, and enlarged lymph nodes.   Physical Exam   BP (!) 141/87   Pulse (!) 102   Temp 98.6 F (37 C)   Ht 5\' 1"  (1.549 m)   Wt 236 lb 3.2 oz (107.1 kg)   LMP 04/21/2017 Comment: SCH  BMI 44.63 kg/m  General:   Alert and oriented. Pleasant and cooperative. Well-nourished and well-developed.  Head:  Normocephalic and atraumatic. Eyes:  Without icterus Abdomen:  +BS, soft, non-tender and non-distended. No HSM noted. No guarding or rebound. No masses appreciated.  Rectal:  Deferred  Msk:  Symmetrical without gross deformities. Normal posture. Extremities:  Without edema. Neurologic:  Alert and  oriented x4;  grossly normal neurologically. Skin:  Intact without significant lesions or rashes. Psych:  Alert and cooperative. Normal mood and affect.   Assessment   Cindy Stevenson is a 47 y.o. female presenting today with a history of rectal bleeding in setting of constipation and internal hemorrhoids, successfully undergoing banding earlier this year.   Linzess 145 mcg overshooting the mark. We will decrease to 72 mcg and trial this.    PLAN    Decrease to Linzess 72 mcg daily Return in 3 months   Gelene Mink, PhD, [facility]

## 2023-06-09 ENCOUNTER — Telehealth: Payer: Self-pay

## 2023-06-09 NOTE — Telephone Encounter (Signed)
Auth Submission: NO AUTH NEEDED Site of care: Site of care: [facility] Payer: [employer]  Medication & CPT/J Code(s) submitted: Vyepti (Eptinezumab) [id number] Route of submission (phone, fax, portal): phone Phone # Fax # Auth type: Buy/Bill HB Units/visits requested: 300mg , q58months Reference number: [id number] Approval from: 06/09/23 to 10/17/23

## 2023-06-13 NOTE — Telephone Encounter (Signed)
[name]: please arrange follow-up with me in 3 months. Thanks!

## 2023-06-24 ENCOUNTER — Other Ambulatory Visit: Payer: Self-pay | Admitting: Psychiatry

## 2023-06-26 ENCOUNTER — Telehealth: Payer: Self-pay | Admitting: *Deleted

## 2023-06-26 NOTE — Telephone Encounter (Signed)
I called to verify pt had orders for her Vyepti 300mg  IV every 3months.  Spoke to [facility] with [facility] at [phone] and they did have orders and PA was done (per other phone message).

## 2023-06-27 NOTE — Telephone Encounter (Signed)
Last seen on 11/16/22 Follow up scheduled on 09/07/23

## 2023-07-02 ENCOUNTER — Other Ambulatory Visit: Payer: Self-pay | Admitting: Psychiatry

## 2023-07-10 ENCOUNTER — Institutional Professional Consult (permissible substitution): Payer: Medicaid Other | Admitting: Adult Health

## 2023-07-14 ENCOUNTER — Encounter (HOSPITAL_BASED_OUTPATIENT_CLINIC_OR_DEPARTMENT_OTHER): Payer: Self-pay | Admitting: Adult Health

## 2023-07-17 ENCOUNTER — Other Ambulatory Visit (HOSPITAL_COMMUNITY): Payer: Self-pay | Admitting: Nephrology

## 2023-07-17 DIAGNOSIS — R809 Proteinuria, unspecified: Secondary | ICD-10-CM

## 2023-07-17 DIAGNOSIS — I129 Hypertensive chronic kidney disease with stage 1 through stage 4 chronic kidney disease, or unspecified chronic kidney disease: Secondary | ICD-10-CM

## 2023-07-28 ENCOUNTER — Encounter: Payer: Medicaid Other | Attending: Surgery | Admitting: Internal Medicine

## 2023-07-28 ENCOUNTER — Other Ambulatory Visit (HOSPITAL_COMMUNITY)
Admission: RE | Admit: 2023-07-28 | Discharge: 2023-07-28 | Disposition: A | Discharge status: Home / Self Care | Payer: Medicaid Other | Source: Ambulatory Visit | Attending: Nephrology | Admitting: Nephrology

## 2023-07-28 ENCOUNTER — Inpatient Hospital Stay (HOSPITAL_COMMUNITY): Admission: RE | Admit: 2023-07-28 | Payer: Medicaid Other | Source: Ambulatory Visit

## 2023-07-28 VITALS — BP 135/90 | HR 91 | Temp 98.3°F | Resp 16

## 2023-07-28 DIAGNOSIS — G43019 Migraine without aura, intractable, without status migrainosus: Secondary | ICD-10-CM | POA: Diagnosis present

## 2023-07-28 DIAGNOSIS — D631 Anemia in chronic kidney disease: Secondary | ICD-10-CM | POA: Diagnosis not present

## 2023-07-28 DIAGNOSIS — N189 Chronic kidney disease, unspecified: Secondary | ICD-10-CM | POA: Insufficient documentation

## 2023-07-28 DIAGNOSIS — R809 Proteinuria, unspecified: Secondary | ICD-10-CM | POA: Insufficient documentation

## 2023-07-28 DIAGNOSIS — E871 Hypo-osmolality and hyponatremia: Secondary | ICD-10-CM | POA: Insufficient documentation

## 2023-07-28 DIAGNOSIS — I129 Hypertensive chronic kidney disease with stage 1 through stage 4 chronic kidney disease, or unspecified chronic kidney disease: Secondary | ICD-10-CM | POA: Insufficient documentation

## 2023-07-28 LAB — CBC
HCT: 35.7 % — ABNORMAL LOW (ref 36.0–46.0)
Hemoglobin: 11.8 g/dL — ABNORMAL LOW (ref 12.0–15.0)
MCH: 29.5 pg (ref 26.0–34.0)
MCHC: 33.1 g/dL (ref 30.0–36.0)
MCV: 89.3 fL (ref 80.0–100.0)
Platelets: 562 10*3/uL — ABNORMAL HIGH (ref 150–400)
RBC: 4 MIL/uL (ref 3.87–5.11)
RDW: 12.8 % (ref 11.5–15.5)
WBC: 10.3 10*3/uL (ref 4.0–10.5)
nRBC: 0 % (ref 0.0–0.2)

## 2023-07-28 LAB — RENAL FUNCTION PANEL
Albumin: 4 g/dL (ref 3.5–5.0)
Anion gap: 12 (ref 5–15)
BUN: 15 mg/dL (ref 6–20)
CO2: 22 mmol/L (ref 22–32)
Calcium: 8.7 mg/dL — ABNORMAL LOW (ref 8.9–10.3)
Chloride: 99 mmol/L (ref 98–111)
Creatinine, Ser: 0.74 mg/dL (ref 0.44–1.00)
GFR, Estimated: >60 mL/min
Glucose, Bld: 130 mg/dL — ABNORMAL HIGH (ref 70–99)
Phosphorus: 3.2 mg/dL (ref 2.5–4.6)
Potassium: 3.8 mmol/L (ref 3.5–5.1)
Sodium: 133 mmol/L — ABNORMAL LOW (ref 135–145)

## 2023-07-28 LAB — URINALYSIS, W/ REFLEX TO CULTURE (INFECTION SUSPECTED)
Bilirubin Urine: NEGATIVE
Glucose, UA: NEGATIVE mg/dL
Hgb urine dipstick: NEGATIVE
Ketones, ur: NEGATIVE mg/dL
Leukocytes,Ua: NEGATIVE
Nitrite: NEGATIVE
Protein, ur: NEGATIVE mg/dL
Specific Gravity, Urine: 1.008 (ref 1.005–1.030)
pH: 5 (ref 5.0–8.0)

## 2023-07-28 LAB — PROTEIN / CREATININE RATIO, URINE
Creatinine, Urine: 38 mg/dL
Total Protein, Urine: <6 mg/dL

## 2023-07-28 LAB — VITAMIN D 25 HYDROXY (VIT D DEFICIENCY, FRACTURES): Vit D, 25-Hydroxy: 16.73 ng/mL — ABNORMAL LOW (ref 30–100)

## 2023-07-28 MED ORDER — EPTINEZUMAB-JJMR 100 MG/ML IV SOLN
300.0000 mg | Freq: Once | INTRAVENOUS | Status: DC
  Filled 2023-07-28: qty 3

## 2023-07-28 MED ORDER — SODIUM CHLORIDE 0.9 % IV SOLN
300.0000 mg | Freq: Once | INTRAVENOUS | Status: AC
  Administered 2023-07-28: 300 mg via INTRAVENOUS
  Filled 2023-07-28: qty 3

## 2023-07-28 MED ORDER — SODIUM CHLORIDE 0.9 % IV SOLN
100.0000 mg | Freq: Once | INTRAVENOUS | Status: DC
  Filled 2023-07-28: qty 1

## 2023-07-28 NOTE — Progress Notes (Addendum)
Diagnosis: Migraine  Provider:  Ocie Doyne, MD  Procedure: IV Infusion  IV Type: Peripheral, IV Location: R Hand  Vyepti (Eptinezumab-jjmr), Dose: 300 mg  Infusion Start Time: 1437  Infusion Stop Time: 1516  Post Infusion IV Care: Observation period completed  Discharge: Condition: Good, Destination: Home . AVS Provided  Performed by:  Feliberto Harts, LPN

## 2023-07-31 ENCOUNTER — Encounter: Payer: Self-pay | Admitting: Gastroenterology

## 2023-08-03 ENCOUNTER — Other Ambulatory Visit: Payer: Self-pay

## 2023-08-16 ENCOUNTER — Other Ambulatory Visit: Payer: Self-pay | Admitting: *Deleted

## 2023-08-16 MED ORDER — DICLOFENAC POTASSIUM(MIGRAINE) 50 MG PO PACK: PACK | ORAL | 0 refills | Status: DC

## 2023-08-24 ENCOUNTER — Other Ambulatory Visit: Payer: Self-pay

## 2023-08-25 ENCOUNTER — Encounter: Payer: Self-pay | Admitting: Adult Health

## 2023-08-28 ENCOUNTER — Other Ambulatory Visit: Payer: Self-pay | Admitting: Neurology

## 2023-08-28 MED ORDER — DICLOFENAC POTASSIUM(MIGRAINE) 50 MG PO PACK: PACK | ORAL | 0 refills | Status: DC

## 2023-08-28 MED ORDER — NARATRIPTAN HCL 2.5 MG PO TABS: 2.5000 mg | ORAL_TABLET | ORAL | 6 refills | Status: DC | PRN

## 2023-08-29 ENCOUNTER — Ambulatory Visit: Payer: Medicaid Other | Admitting: Gastroenterology

## 2023-08-31 ENCOUNTER — Encounter: Payer: Medicaid Other | Admitting: Gastroenterology

## 2023-09-01 ENCOUNTER — Telehealth: Payer: Self-pay

## 2023-09-01 ENCOUNTER — Encounter: Payer: Self-pay | Admitting: Gastroenterology

## 2023-09-01 ENCOUNTER — Other Ambulatory Visit (HOSPITAL_COMMUNITY): Payer: Self-pay

## 2023-09-01 NOTE — Telephone Encounter (Signed)
Pharmacy Patient Advocate Encounter   Received notification from RX Request Messages that prior authorization for Diclofenac Potassium(Migraine) 50MG  packets is required/requested.   Insurance verification completed.   The patient is insured through [employer] .   Per test claim: PA required; PA submitted to above mentioned insurance via CoverMyMeds Key/confirmation #/EOC [id number] Status is pending

## 2023-09-04 ENCOUNTER — Other Ambulatory Visit: Payer: Self-pay | Admitting: *Deleted

## 2023-09-04 MED ORDER — BACLOFEN 10 MG PO TABS: ORAL_TABLET | ORAL | 0 refills | Status: DC

## 2023-09-04 NOTE — Telephone Encounter (Signed)
Last seen on 05/03/23 Follow up scheduled on 09/07/23

## 2023-09-06 ENCOUNTER — Other Ambulatory Visit (HOSPITAL_COMMUNITY): Payer: Self-pay

## 2023-09-06 NOTE — Telephone Encounter (Signed)
Pharmacy Patient Advocate Encounter  Received notification from [facility] that Prior Authorization for Diclofenac Potassium(Migraine) 50MG  packets has been APPROVED from 09/01/2023 to 08/31/2024   PA #/Case ID/Reference #: [id number]

## 2023-09-07 ENCOUNTER — Encounter: Payer: Self-pay | Admitting: Adult Health

## 2023-09-07 ENCOUNTER — Ambulatory Visit: Payer: Medicaid Other | Admitting: Adult Health

## 2023-09-07 VITALS — BP 138/79 | HR 98 | Ht 61.0 in | Wt 230.0 lb

## 2023-09-07 DIAGNOSIS — G4440 Drug-induced headache, not elsewhere classified, not intractable: Secondary | ICD-10-CM

## 2023-09-07 DIAGNOSIS — M5481 Occipital neuralgia: Secondary | ICD-10-CM | POA: Diagnosis not present

## 2023-09-07 DIAGNOSIS — G43719 Chronic migraine without aura, intractable, without status migrainosus: Secondary | ICD-10-CM | POA: Diagnosis not present

## 2023-09-07 MED ORDER — METHOCARBAMOL 500 MG PO TABS: 500.0000 mg | ORAL_TABLET | Freq: Two times a day (BID) | ORAL | 5 refills | Status: DC | PRN

## 2023-09-07 NOTE — Patient Instructions (Addendum)
Your Plan:  Continue Vyepti 300mg  infusion for migraines  Limit use of naratriptan, diclofenac and over the counter pain relievers to no more than 2-3 times per week or 8 times per month as more frequent use can cause rebound headaches  Use of Robaxin up to twice daily as needed  You can try Migrelief which is magnesium and B2 supplement which can help with migraines  Would highly encourage pursuing sleep consult as underlying sleep apnea could be contributing to headaches  [facility]     Follow up in 6 months or call earlier if needed     Thank you for coming to see [place] at [facility]. I hope we have been able to provide you high quality care today.  You may receive a patient satisfaction survey over the next few weeks. We would appreciate your feedback and comments so that we may continue to improve ourselves and the health of our patients.

## 2023-09-07 NOTE — Progress Notes (Signed)
CC:  headaches  Follow-up Visit  Last visit: 08/15/2022 Dr. Delena Bali  Brief HPI: 47 year old female with a history of DM who follows in clinic for chronic migraines.  At her last visit, continued Vyepti infusions for prevention and continue naratriptan, baclofen and Cambia as needed for rescue.  She returned on 11/16/2022 for occipital nerve block.   MyChart message 12/2022 requesting early refill for Cambia reporting use 1 to 2 packs a day as trying to limit naratriptan.  She was referred to [facility] for further headache management recommendations as concerned preventative medication not working well if still requiring rescue meds every day, again advised limiting use of rescue meds due to potential rebound headache.    Interval History:  She reports migraines have overall been well-controlled with Vyepti, currently having about 4 more severe migraines per month.  She has been having more persistent frontal tension type headaches as well as left occipital neuralgia symptoms.  She continues to take naratriptan daily, previously still taking diclofenac powder 1-2 times per day but ran out early and recently just filled.  Denies any great benefit with baclofen, has previously tried Robaxin with benefit and questions if she can try Robaxin, prior intolerance to Flexeril.  She was never seen by [facility] for second opinion due to lack of transportation.  Reports prior benefit with nerve block for occipital neuralgia but only lasted 2 to 3 days.  She was recently referred for sleep study, currently awaiting to schedule appointment.     Current Headache Regimen: Preventative: Vyepti 300 mg every 3 months Abortive: naratriptan 2.5 mg PRN, diclofenac, baclofen   Prior Therapies                                  Preventive: Prozac Paxil Cymbalta Amitriptyline Topamax Gabapentin 400 mg TID Metoprolol Lisinopril losartan Ajovy Aimovig Emgality Vyepti 300 mg  Botox - worsened neck  pain Occipital nerve block Neck PT   Rescue: Naratriptan 2.5 mg PRN Maxalt - lack of efficacy Imitrex - lack of efficacy Nurtec - lack of efficacy Ubrelvy - lack of efficacy Zavzpret - lack of efficacy Diclofenac  - stomach upset Baclofen Flexeril    Current Outpatient Medications on File Prior to Visit  Medication Sig Dispense Refill   ACCU-CHEK GUIDE test strip      Accu-Chek Softclix Lancets lancets SMARTSIG:Topical     acetaminophen (TYLENOL) 500 MG tablet Take 2 tablets (1,000 mg total) by mouth every 6 (six) hours. 30 tablet 0   albuterol (VENTOLIN HFA) 108 (90 Base) MCG/ACT inhaler Inhale 2 puffs into the lungs every 6 (six) hours as needed for shortness of breath or wheezing.     ALPRAZolam (XANAX) 1 MG tablet Take 1 mg by mouth 3 (three) times daily.     baclofen (LIORESAL) 10 MG tablet TAKE 1/2 TO 1 TABLET BY MOUTH THREE TIMES DAILY AS NEEDED FOR MUSCLE SPASMS 90 tablet 0   Bismuth Subsalicylate (PEPTO-BISMOL) 262 MG TABS Take 262 mg by mouth daily as needed (indigestion).     cetirizine (ZYRTEC) 10 MG tablet Take 10 mg by mouth daily.     Diclofenac Potassium,Migraine, 50 MG PACK TAKE 50-100 MG BY MOUTH AS NEEDED FOR MIGRAINE (Max Daily Dose: 100 MG in 24 HOURS) 9 each 0   docusate sodium (COLACE) 100 MG capsule Take 1 capsule (100 mg total) by mouth 2 (two) times daily. 60 capsule 2  Eptinezumab-jjmr (VYEPTI) 100 MG/ML injection Inject 300 mg into the vein every 3 (three) months. Getting at [facility] [phone], fax [phone]     esomeprazole (NEXIUM) 40 MG capsule Take 40 mg by mouth 2 (two) times daily.      ezetimibe (ZETIA) 10 MG tablet Take 10 mg by mouth daily.     gabapentin (NEURONTIN) 400 MG capsule Take 400 mg by mouth 4 (four) times daily.     hydrochlorothiazide (HYDRODIURIL) 25 MG tablet Take 25 mg by mouth daily.     ibuprofen (ADVIL) 600 MG tablet Take 600 mg by mouth every 8 (eight) hours as needed for headache.     labetalol (NORMODYNE)  100 MG tablet Take 50 mg by mouth 2 (two) times daily.     linaclotide (LINZESS) 145 MCG CAPS capsule TAKE ONE CAPSULE BY MOUTH EVERY MORNING BEFORE breakfast 90 capsule 3   melatonin 5 MG TABS Take 10 mg by mouth at bedtime.     mupirocin ointment (BACTROBAN) 2 % Apply 1 Application topically 3 (three) times daily.     naratriptan (AMERGE) 2.5 MG tablet Take 1 tablet (2.5 mg total) by mouth as needed for migraine. Take one (1) tablet at onset of headache; if returns or does not resolve, may repeat after 4 hours; do not exceed five (5) mg in 24 hours. 12 tablet 6   nystatin (MYCOSTATIN/NYSTOP) powder Apply topically.     ondansetron (ZOFRAN) 8 MG tablet Take 8 mg by mouth every 8 (eight) hours as needed for nausea.     Polyethyl Glycol-Propyl Glycol (SYSTANE OP) Place 1 drop into both eyes daily as needed (dry eyes).     PROCTO-MED HC 2.5 % rectal cream APPLY 1 GRAM RECTALLY TWICE DAILY (Patient taking differently: Place 1 Application rectally 2 (two) times daily as needed for hemorrhoids.) 30 g 1   Vitamin D, Ergocalciferol, 50000 units CAPS Take 1 capsule by mouth once a week.     Semaglutide,0.25 or 0.5MG /DOS, (OZEMPIC, 0.25 OR 0.5 MG/DOSE,) 2 MG/3ML SOPN Inject 0.25 mg into the skin once a week. (Patient not taking: Reported on 06/06/2023)     No current facility-administered medications on file prior to visit.   Past Medical History:  Diagnosis Date   Anxiety    Breast nodule 08/06/2015   Breast pain, left 08/06/2015   Burning with urination 02/27/2014   Common migraine with intractable migraine 06/11/2018   Depression    Diabetes mellitus without complication (HCC)    Dysmenorrhea 02/27/2014   Fibroadenoma of right breast 08/26/2015   GERD (gastroesophageal reflux disease)    Headache    Hematuria 02/27/2014   Hypertension    PTSD (post-traumatic stress disorder)    Screening for STD (sexually transmitted disease) 03/27/2014   Seasonal allergies    SUI (stress urinary incontinence,  female) 02/27/2014   Vaginal discharge 08/06/2015   Warts, genital 01/23/2015   Yeast infection 08/06/2015   Past Surgical History:  Procedure Laterality Date   BALLOON DILATION N/A 03/24/2022   Procedure: BALLOON DILATION;  Surgeon: Lanelle Bal, DO;  [facility];  Service: Endoscopy;  Laterality: N/A;   BILATERAL SALPINGECTOMY Bilateral 05/09/2017   Procedure: BILATERAL SALPINGECTOMY;  Surgeon: [name], MD;  Location: [facility];  Service: Gynecology;  Laterality: Bilateral;   COLONOSCOPY N/A 09/10/2013   ONG:EXBMW;/UXLKG internal hemorrhoids   COLONOSCOPY WITH PROPOFOL N/A 03/24/2022   Surgeon: Lanelle Bal, DO;   Hemorrhoids on perianal exam, internal hemorrhoids, otherwise normal exam.  Repeat in 10 years.   ESOPHAGOGASTRODUODENOSCOPY N/A 09/10/2013   UJW:[id number] ring at the gastroesophagral juctions/mild non-erosive gastritis   ESOPHAGOGASTRODUODENOSCOPY  2017   Dr. Teena Dunk; Schatzki's ring in the distal esophagus, otherwise normal exam.   ESOPHAGOGASTRODUODENOSCOPY (EGD) WITH PROPOFOL N/A 03/24/2022   Surgeon: Lanelle Bal, DO;   Mild Schatzki's ring dilated, 2 gastric polyps removed, otherwise normal exam. Polyps were hyperplastic.   LABIOPLASTY  02/14/2012   Procedure: LABIAPLASTY;  Surgeon: [name], MD;  Location: [facility];  Service: Gynecology;  Laterality: Right;  of the right labia minora   POLYPECTOMY  03/24/2022   Procedure: POLYPECTOMY;  Surgeon: Lanelle Bal, DO;  [facility];  Service: Endoscopy;;   SUPRACERVICAL ABDOMINAL HYSTERECTOMY N/A 05/09/2017   Procedure: HYSTERECTOMY SUPRACERVICAL ABDOMINAL;  Surgeon: [name], MD;  Location: [facility];  Service: Gynecology;  Laterality: N/A;   WISDOM TOOTH EXTRACTION  10/18/1995   Dr. Manson Passey          Physical Exam:   Vital Signs: BP 138/79 (BP Location: Left Arm, Patient Position: Sitting, Cuff Size: Normal)   Pulse 98   Ht 5\' 1"  (1.549 m)   Wt 230 lb (104.3  kg)   LMP 04/21/2017 Comment: SCH  BMI 43.46 kg/m  GENERAL:  well appearing, in no acute distress, alert  SKIN:  Color, texture, turgor normal. No rashes or lesions HEAD:  Normocephalic/atraumatic. RESP: normal respiratory effort MSK:  No gross joint deformities.   NEUROLOGICAL: Mental Status: Alert, oriented to person, place and time, Follows commands, and Speech fluent and appropriate. Cranial Nerves: PERRL, face symmetric, no dysarthria, hearing grossly intact Motor: moves all extremities equally Gait: normal-based.    IMPRESSION: 47 year old female with a history of DM who presents for follow up of chronic migraines. She has noticed some improvement with Vyepti 300 mg every 3 months with current migraines about 4/month.  Continues to have almost daily tension type headache, suspect overuse of triptans and OTC medications contributing likely causing analgesic rebound headache.  She also continues to have issues with occipital neuralgia.    PLAN: -Preventive: Continue Vyepti 300 mg every 3 months -Rescue: Continue naratriptan 2.5 PRN,  Cambia 50-100 mg PRN, try Robaxin as needed -Discussed importance of limiting rescue medications and OTC pain relievers to no more than 2-3 times per week due to rebound headache.  She was encouraged to use these medications only for more severe migraine.  -[facility]erral [facility] to [facility] to discuss other treatment options and potentially resume nerve block injections for occipital neuralgia -Highly recommend pursuing sleep consult to rule out underlying sleep apnea contributing to headaches, reports recent referral placed by PCP   Follow-up in 6 months or call earlier if needed     I spent 30 minutes of face-to-face and non-face-to-face time with patient.  This included previsit chart review, lab review, study review, order entry, electronic health record documentation, patient education and discussion regarding above diagnoses and  treatment plan and answered all other questions to patient's satisfaction  Ihor Austin, [facility]  [facility] [address] [place], [place] [address]  Phone [phone] Fax [phone] Note: This document was prepared with digital dictation and possible smart phrase technology. Any transcriptional errors that result from this process are unintentional.

## 2023-09-11 ENCOUNTER — Telehealth: Payer: Self-pay | Admitting: Adult Health

## 2023-09-11 NOTE — Telephone Encounter (Signed)
Noted  

## 2023-09-11 NOTE — Telephone Encounter (Signed)
Referral for headache clinic [facility]. Phone: [phone], Fax: [phone]

## 2023-09-12 ENCOUNTER — Ambulatory Visit (INDEPENDENT_AMBULATORY_CARE_PROVIDER_SITE_OTHER): Payer: Medicaid Other | Admitting: Gastroenterology

## 2023-09-12 ENCOUNTER — Encounter: Payer: Self-pay | Admitting: Gastroenterology

## 2023-09-12 ENCOUNTER — Telehealth (INDEPENDENT_AMBULATORY_CARE_PROVIDER_SITE_OTHER): Payer: Self-pay | Admitting: Gastroenterology

## 2023-09-12 VITALS — BP 137/86 | HR 106 | Temp 97.5°F | Ht 61.0 in | Wt 231.9 lb

## 2023-09-12 DIAGNOSIS — K219 Gastro-esophageal reflux disease without esophagitis: Secondary | ICD-10-CM | POA: Diagnosis not present

## 2023-09-12 DIAGNOSIS — R1084 Generalized abdominal pain: Secondary | ICD-10-CM

## 2023-09-12 DIAGNOSIS — R112 Nausea with vomiting, unspecified: Secondary | ICD-10-CM | POA: Diagnosis not present

## 2023-09-12 DIAGNOSIS — R1011 Right upper quadrant pain: Secondary | ICD-10-CM | POA: Diagnosis not present

## 2023-09-12 DIAGNOSIS — K5900 Constipation, unspecified: Secondary | ICD-10-CM

## 2023-09-12 NOTE — Telephone Encounter (Signed)
PA for CT pending via availity

## 2023-09-12 NOTE — Progress Notes (Signed)
Gastroenterology Office Note     Primary Care Physician:  Sheela Stack  Primary Gastroenterologist: Dr. Marletta Lor    Chief Complaint   Chief Complaint  Patient presents with   Follow-up    Patient here today for a follow up on the RUQ pain. She is still having issues with constipation, and the Linzess 145 mcg has stop working. She says she had tried a stool softener with a gentle laxative.      History of Present Illness   Cindy Stevenson is a 47 y.o. female presenting today for follow-up with history of hemorrhoids s/p banding, constipation, abdominal pain s/p cholecystectomy   Right -sided abdominal pain.   Has intermittent burps and had associated N/V/D. Was on Ozempic at that time but has had prior to this. Will smell the burp and have epigastric abdominal pain then vomit. Last episode last Monday. Episodes months apart. A1c 8.6 in June 2024.   Linzess 145 mcg not effective as before.   Past Medical History:  Diagnosis Date   Anxiety    Breast nodule 08/06/2015   Breast pain, left 08/06/2015   Burning with urination 02/27/2014   Common migraine with intractable migraine 06/11/2018   Depression    Diabetes mellitus without complication (HCC)    Dysmenorrhea 02/27/2014   Fibroadenoma of right breast 08/26/2015   GERD (gastroesophageal reflux disease)    Headache    Hematuria 02/27/2014   Hypertension    PTSD (post-traumatic stress disorder)    Screening for STD (sexually transmitted disease) 03/27/2014   Seasonal allergies    SUI (stress urinary incontinence, female) 02/27/2014   Vaginal discharge 08/06/2015   Warts, genital 01/23/2015   Yeast infection 08/06/2015    Past Surgical History:  Procedure Laterality Date   BALLOON DILATION N/A 03/24/2022   Procedure: BALLOON DILATION;  Surgeon: Lanelle Bal, DO;  [facility];  Service: Endoscopy;  Laterality: N/A;   BILATERAL SALPINGECTOMY Bilateral 05/09/2017   Procedure: BILATERAL  SALPINGECTOMY;  Surgeon: [name], MD;  Location[facility];  Service: Gynecology;  Laterality: Bilateral;   CHOLECYSTECTOMY  03/2023   COLONOSCOPY N/A 09/10/2013   YQM:VHQIO;/NGEXB internal hemorrhoids   COLONOSCOPY WITH PROPOFOL N/A 03/24/2022   Surgeon: Lanelle Bal, DO;   Hemorrhoids on perianal exam, internal hemorrhoids, otherwise normal exam.  Repeat in 10 years.   ESOPHAGOGASTRODUODENOSCOPY N/A 09/10/2013   MWU:XLKGMWNU ring at the gastroesophagral juctions/mild non-erosive gastritis   ESOPHAGOGASTRODUODENOSCOPY  2017   Dr. Teena Dunk; Schatzki's ring in the distal esophagus, otherwise normal exam.   ESOPHAGOGASTRODUODENOSCOPY (EGD) WITH PROPOFOL N/A 03/24/2022   Surgeon: Lanelle Bal, DO;   Mild Schatzki's ring dilated, 2 gastric polyps removed, otherwise normal exam. Polyps were hyperplastic.   LABIOPLASTY  02/14/2012   Procedure: LABIAPLASTY;  Surgeon: [name], MD;  Location: [facility];  Service: Gynecology;  Laterality: Right;  of the right labia minora   POLYPECTOMY  03/24/2022   Procedure: POLYPECTOMY;  Surgeon: Lanelle Bal, DO;  [facility];  Service: Endoscopy;;   SUPRACERVICAL ABDOMINAL HYSTERECTOMY N/A 05/09/2017   Procedure: HYSTERECTOMY SUPRACERVICAL ABDOMINAL;  Surgeon: [name], MD;  Location: [facility];  Service: Gynecology;  Laterality: N/A;   WISDOM TOOTH EXTRACTION  10/18/1995   Dr. Manson Passey    Current Outpatient Medications  Medication Sig Dispense Refill   ACCU-CHEK GUIDE test strip      Accu-Chek Softclix Lancets lancets SMARTSIG:Topical     acetaminophen (TYLENOL) 500 MG tablet  Take 2 tablets (1,000 mg total) by mouth every 6 (six) hours. 30 tablet 0   albuterol (VENTOLIN HFA) 108 (90 Base) MCG/ACT inhaler Inhale 2 puffs into the lungs every 6 (six) hours as needed for shortness of breath or wheezing.     ALPRAZolam (XANAX) 1 MG tablet Take 1 mg by mouth 3 (three) times daily.     Bismuth Subsalicylate (PEPTO-BISMOL) 262  MG TABS Take 262 mg by mouth daily as needed (indigestion).     cetirizine (ZYRTEC) 10 MG tablet Take 10 mg by mouth daily.     Diclofenac Potassium,Migraine, 50 MG PACK TAKE 50-100 MG BY MOUTH AS NEEDED FOR MIGRAINE (Max Daily Dose: 100 MG in 24 HOURS) 9 each 0   docusate sodium (COLACE) 100 MG capsule Take 1 capsule (100 mg total) by mouth 2 (two) times daily. 60 capsule 2   Eptinezumab-jjmr (VYEPTI) 100 MG/ML injection Inject 300 mg into the vein every 3 (three) months. Getting at [facility] [phone], fax [phone]     esomeprazole (NEXIUM) 40 MG capsule Take 40 mg by mouth 2 (two) times daily.      gabapentin (NEURONTIN) 400 MG capsule Take 400 mg by mouth 4 (four) times daily.     hydrochlorothiazide (HYDRODIURIL) 25 MG tablet Take 25 mg by mouth daily.     ibuprofen (ADVIL) 600 MG tablet Take 600 mg by mouth every 8 (eight) hours as needed for headache.     labetalol (NORMODYNE) 100 MG tablet Take 50 mg by mouth 2 (two) times daily.     linaclotide (LINZESS) 145 MCG CAPS capsule TAKE ONE CAPSULE BY MOUTH EVERY MORNING BEFORE breakfast 90 capsule 3   melatonin 5 MG TABS Take 10 mg by mouth at bedtime.     methocarbamol (ROBAXIN) 500 MG tablet Take 1 tablet (500 mg total) by mouth 2 (two) times daily as needed for muscle spasms. 60 tablet 5   mupirocin ointment (BACTROBAN) 2 % Apply 1 Application topically 3 (three) times daily.     naratriptan (AMERGE) 2.5 MG tablet Take 1 tablet (2.5 mg total) by mouth as needed for migraine. Take one (1) tablet at onset of headache; if returns or does not resolve, may repeat after 4 hours; do not exceed five (5) mg in 24 hours. 12 tablet 6   nystatin (MYCOSTATIN/NYSTOP) powder Apply topically.     ondansetron (ZOFRAN) 8 MG tablet Take 8 mg by mouth every 8 (eight) hours as needed for nausea.     Polyethyl Glycol-Propyl Glycol (SYSTANE OP) Place 1 drop into both eyes daily as needed (dry eyes).     PROCTO-MED HC 2.5 % rectal cream APPLY 1 GRAM  RECTALLY TWICE DAILY (Patient taking differently: Place 1 Application rectally 2 (two) times daily as needed for hemorrhoids.) 30 g 1   Vitamin D, Ergocalciferol, 50000 units CAPS Take 1 capsule by mouth once a week.     No current facility-administered medications for this visit.    Allergies as of 09/12/2023 - Review Complete 09/12/2023  Allergen Reaction Noted   Effexor [venlafaxine] Rash and Hypertension 07/12/2021   Latex Dermatitis 05/09/2016   Codeine Nausea And Vomiting 02/13/2012   Shellfish allergy Hives 02/13/2012   Sulfa antibiotics Swelling 07/18/2022   Tape  03/27/2023   Amoxicillin Rash 01/23/2015   Ceftin [cefuroxime] Rash 12/09/2020    Family History  Problem Relation Age of Onset   Lung cancer Mother    Asthma Sister    Allergies Sister    Mental  illness Maternal Aunt        depression, PTSD   Diabetes Maternal Grandmother    Cancer Maternal Grandfather        skin   Colon cancer Neg Hx     Social History   Socioeconomic History   Marital status: Single    Spouse name: Not on file   Number of children: Not on file   Years of education: Not on file   Highest education level: Not on file  Occupational History   Occupation: disability    Employer: NOT EMPLOYED  Tobacco Use   Smoking status: Never   Smokeless tobacco: Never  Vaping Use   Vaping status: Never Used  Substance and Sexual Activity   Alcohol use: No   Drug use: No   Sexual activity: Not Currently    Birth control/protection: Surgical    Comment: [facility]  Other Topics Concern   Not on file  Social History Narrative   Not on file   Social Determinants of Health   Financial Resource Strain: Low Risk  (11/24/2021)   Received from [facility], [facility]   Overall Financial Resource Strain (CARDIA)    Difficulty of Paying Living Expenses: Not hard at all  Food Insecurity: No Food Insecurity (11/24/2021)   Received from [facility], [facility]   Hunger Vital Sign     Worried About Running Out of Food in the Last Year: Never true    Ran Out of Food in the Last Year: Never true  Transportation Needs: No Transportation Needs (11/24/2021)   Received from [facility], [facility]   [facility] - Transportation    Lack of Transportation (Medical): No    Lack of Transportation (Non-Medical): No  Physical Activity: Inactive (08/09/2021)   Received from [facility], [facility]   Exercise Vital Sign    Days of Exercise per Week: 0 days    Minutes of Exercise per Session: 0 min  Stress: Stress Concern Present (08/09/2021)   Received from [facility], [facility] of Occupational Health - Occupational Stress Questionnaire    Feeling of Stress : Very much  Social Connections: Unknown (02/15/2022)   Received from [facility], [facility]   Social Network    Social Network: Not on file  Recent Concern: Social Connections - Socially Isolated (11/24/2021)   Received from [facility], [facility]   Social Connection and Isolation Panel [NHANES]    Frequency of Communication with Friends and Family: More than three times a week    Frequency of Social Gatherings with Friends and Family: Once a week    Attends Religious Services: Never    Database administrator or Organizations: No    Attends Banker Meetings: Never    Marital Status: Never married  Intimate Partner Violence: Not At Risk (05/30/2022)   Received from [facility], [facility]   Humiliation, Afraid, Rape, and Kick questionnaire    Fear of Current or Ex-Partner: No    Emotionally Abused: No    Physically Abused: No    Sexually Abused: No     Review of Systems   Gen: Denies any fever, chills, fatigue, weight loss, lack of appetite.  CV: Denies chest pain, heart palpitations, peripheral edema, syncope.  Resp: Denies shortness of breath at rest or with exertion. Denies wheezing or cough.  GI: Denies dysphagia or odynophagia. Denies  jaundice,  hematemesis, fecal incontinence. GU : Denies urinary burning, urinary frequency, urinary hesitancy MS: Denies joint pain, muscle weakness, cramps, or limitation of movement.  Derm: Denies rash, itching, dry skin Psych: Denies depression, anxiety, memory loss, and confusion Heme: Denies bruising, bleeding, and enlarged lymph nodes.   Physical Exam   BP 137/86 (BP Location: Left Arm, Patient Position: Sitting, Cuff Size: Large)   Pulse (!) 106   Temp (!) 97.5 F (36.4 C) (Temporal)   Ht 5\' 1"  (1.549 m)   Wt 231 lb 14.4 oz (105.2 kg)   LMP 04/21/2017 Comment: SCH  BMI 43.82 kg/m  General:   Alert and oriented. Pleasant and cooperative. Well-nourished and well-developed.  Head:  Normocephalic and atraumatic. Eyes:  Without icterus Abdomen:  +BS, soft, non-tender and non-distended. No HSM noted. No guarding or rebound. No masses appreciated.  Rectal:  Deferred  Msk:  Symmetrical without gross deformities. Normal posture. Extremities:  Without edema. Neurologic:  Alert and  oriented x4;  grossly normal neurologically. Skin:  Intact without significant lesions or rashes. Psych:  Alert and cooperative. Normal mood and affect.   Assessment   Abdominal pain multifactorial in setting of constipation, GERD, recent cholecystectomy now with similar RUQ pain, unable to rule out occult microlithiasis. Query if Ozempic could be contributing to bouts of abdominal pain N/V Constipation GERD    PLAN    Stop linzess, start Trulance CT abd/pelvis with contrast Nexium BID Further recommendations to follow   Gelene Mink, PhD, ANP-BC [facility]

## 2023-09-12 NOTE — Patient Instructions (Signed)
For constipation: let's stop Linzess. Instead, start Trulance once each day with or without food.  We are arranging a CT scan in the near future.   Further recommendations to follow!  I enjoyed seeing you again today! I value our relationship and want to provide genuine, compassionate, and quality care. You may receive a survey regarding your visit with me, and I welcome your feedback! Thanks so much for taking the time to complete this. I look forward to seeing you again.      Gelene Mink, PhD, ANP-BC [facility]

## 2023-09-16 ENCOUNTER — Encounter: Payer: Self-pay | Admitting: Gastroenterology

## 2023-09-18 ENCOUNTER — Encounter: Payer: Self-pay | Admitting: Adult Health

## 2023-09-18 DIAGNOSIS — G43719 Chronic migraine without aura, intractable, without status migrainosus: Secondary | ICD-10-CM

## 2023-09-18 NOTE — Telephone Encounter (Signed)
[facility] PA: Approval # [id number] DOS: 09/13/23-11/12/23

## 2023-09-18 NOTE — Telephone Encounter (Signed)
Pt states wanted to see if could get CT scheduled at [facility].   Advised pt that the facility would be getting in touch with her to schedule the CT.  Order faxed to [facility]

## 2023-09-19 NOTE — Telephone Encounter (Signed)
Not sure why it was sent to [facility] in [place]? It was requested to be [facility] to [facility] which is in [place]. Can this please be sent to correct location? Thank you!

## 2023-09-21 MED ORDER — TRULANCE 3 MG PO TABS: 1.0000 | ORAL_TABLET | Freq: Every day | ORAL | 3 refills | Status: DC

## 2023-09-21 NOTE — Telephone Encounter (Signed)
POD 3 - Please place referral for local headache clinic - no preference on location. Needs to treat occipital neuralgia with nerve blocks. Thank you.

## 2023-09-21 NOTE — Telephone Encounter (Signed)
Resent referral for headache clinic to [facility] . Phone: [phone], Fax: [phone].

## 2023-09-21 NOTE — Telephone Encounter (Signed)
Received a call from [facility] [place]) we do not accept [facility], we are not in patient insurance network. Do you have some else you would like to send the referral?

## 2023-09-21 NOTE — Telephone Encounter (Signed)
Received a call from  [facility] [place]) notified we are not in patient's network, we do not accept Medicaid.

## 2023-09-25 ENCOUNTER — Telehealth: Payer: Self-pay

## 2023-09-25 NOTE — Telephone Encounter (Signed)
Pt was just denied for Trulance

## 2023-09-25 NOTE — Telephone Encounter (Signed)
PA done on Cover My Meds or Trulance 3mg  tab. Pt has tried and failed Linzess 72 mcg, and . Waiting on a response from Cover My Meds

## 2023-09-25 NOTE — Telephone Encounter (Signed)
Thanks, Chaya Jan! Do we know why?

## 2023-09-25 NOTE — Telephone Encounter (Signed)
Did we get a PA for Trulance? Thanks!

## 2023-09-26 NOTE — Telephone Encounter (Signed)
Cindy Stevenson,  I have ran off the pt's formulary regarding her medication list, and her denial letter. It is in the yellow folder on your desk. Once you look into things just let me know what to do

## 2023-09-26 NOTE — Addendum Note (Signed)
Addended by: Armstead Peaks on: 09/26/2023 04:14 PM   Modules accepted: Orders

## 2023-09-26 NOTE — Telephone Encounter (Signed)
Pt denied. She has tried and failed Linzess but she needs to try something else on her formulary. I will print off her formulary as well.

## 2023-09-26 NOTE — Telephone Encounter (Signed)
[facility] called and they need order for BUN/CREAT faxed to them at [phone].

## 2023-09-27 NOTE — Telephone Encounter (Signed)
Please place referral to local headache clinic (except for [facility] as they do not accept patient's insurance). Thank you.

## 2023-09-28 NOTE — Telephone Encounter (Signed)
Referral placed.

## 2023-10-02 NOTE — Telephone Encounter (Signed)
Movantik is the only thing on her formulary that she has not tried and failed.

## 2023-10-02 NOTE — Telephone Encounter (Signed)
Dena or Tammy:  Can we see what the formulary lists as I'm not at that office the rest of this week? Will send it in for her if needed.

## 2023-10-04 NOTE — Telephone Encounter (Signed)
Thanks! So, Movantik would not be appropriate as she does not have opioid-induced constipation. Can we try to appeal this and make sure it lists that she has tried everything else? Thanks!

## 2023-10-04 NOTE — Telephone Encounter (Signed)
Appeal has been submitted. Waiting for approval/denial from insurance company.

## 2023-10-05 NOTE — Telephone Encounter (Signed)
Appeal for PA on Trulance was approved. Approval document to be scanned in to chart.

## 2023-10-15 ENCOUNTER — Other Ambulatory Visit: Payer: Self-pay | Admitting: Neurology

## 2023-10-16 ENCOUNTER — Telehealth: Payer: Self-pay | Admitting: Adult Health

## 2023-10-16 MED ORDER — BACLOFEN 10 MG PO TABS: 10.0000 mg | ORAL_TABLET | Freq: Every day | ORAL | 11 refills | Status: DC | PRN

## 2023-10-16 NOTE — Telephone Encounter (Signed)
I called the patient to relay the message. She reports having just tried baclofen and stating it did not work well. She also denies benefit from robaxin or flexeril. She states there was another medication mentioned during the office visit, however she is unable to recall what it was.

## 2023-10-16 NOTE — Telephone Encounter (Signed)
I spoke with the patient. She could not recall the medication but is agreeable to starting tizanidine. Medication pended for review.

## 2023-10-16 NOTE — Addendum Note (Signed)
Addended by: Christophe Louis E on: 10/16/2023 04:21 PM   Modules accepted: Orders

## 2023-10-16 NOTE — Telephone Encounter (Signed)
Pt said, methocarbamol (ROBAXIN) 500 MG tablet for muscle spasm is not working. Wanted to ask if you would send in a different muscle relaxer? Maybe the one you had mentioned first at last office visit?

## 2023-10-16 NOTE — Telephone Encounter (Signed)
"  Denies any great benefit with baclofen, has previously tried Robaxin with benefit and questions if she can try Robaxin, prior intolerance to Flexeril."  I do not see mention of another muscle relaxer in the last office visit note. Do you have any recommendations for the patient?

## 2023-10-16 NOTE — Telephone Encounter (Signed)
Is she asking about tizanidine? If so, can place order for tizanidine 2 mg once daily as needed for headache. Otherwise, I am not aware of any other muscle relaxants that would be used to treat headaches. Thank you.

## 2023-10-16 NOTE — Telephone Encounter (Signed)
Please advise patient to try baclofen 10 mg as needed for migraine headache. Order will be placed. Thank you.

## 2023-10-17 NOTE — Telephone Encounter (Signed)
Rx refilled per last office visit note.

## 2023-10-19 ENCOUNTER — Telehealth: Payer: Self-pay

## 2023-10-19 ENCOUNTER — Other Ambulatory Visit: Payer: Self-pay

## 2023-10-19 DIAGNOSIS — M5481 Occipital neuralgia: Secondary | ICD-10-CM

## 2023-10-19 DIAGNOSIS — G43719 Chronic migraine without aura, intractable, without status migrainosus: Secondary | ICD-10-CM

## 2023-10-19 MED ORDER — TIZANIDINE HCL 2 MG PO TABS: ORAL_TABLET | ORAL | 0 refills | Status: DC

## 2023-10-19 MED ORDER — METHOCARBAMOL 500 MG PO TABS: 500.0000 mg | ORAL_TABLET | Freq: Two times a day (BID) | ORAL | 5 refills | Status: DC | PRN

## 2023-10-19 NOTE — Telephone Encounter (Signed)
 Called patient pharmacy to cancel Rx requested for the Robaxin and place order for correct Rx refills.Marland Kitchen

## 2023-10-19 NOTE — Telephone Encounter (Addendum)
 Auth Submission: NO AUTH NEEDED Site of care: [facility] Payer: medicaid Medication & CPT/J Code(s) submitted: Vyepti  (Eptinezumab ) [id number] Route of submission (phone, fax, portal): phone Phone # Fax # Auth type: Buy/Bill HB Units/visits requested: 100mg  q46months Reference number:  Approval from: 10/19/23 to 10/16/24    Externally supplied and pt enrolled in vyepti  pt assitance program from 10/18/23 to 10/16/24

## 2023-10-19 NOTE — Telephone Encounter (Signed)
 Pt LVM stating the pharmacy doesn't have the new medication that was due to be sent over before the new year. Requesting call back

## 2023-10-19 NOTE — Addendum Note (Signed)
 Addended by: Judi Cong on: 10/19/2023 02:52 PM   Modules accepted: Orders

## 2023-10-19 NOTE — Telephone Encounter (Signed)
 Resent the tizanidine order for the patient to the pharmacy

## 2023-10-24 ENCOUNTER — Ambulatory Visit: Payer: Medicaid Other | Admitting: Physical Medicine and Rehabilitation

## 2023-10-25 ENCOUNTER — Telehealth: Payer: Self-pay | Admitting: Physical Medicine and Rehabilitation

## 2023-10-25 ENCOUNTER — Ambulatory Visit: Payer: Medicaid Other

## 2023-10-25 ENCOUNTER — Ambulatory Visit: Payer: Medicaid Other | Admitting: Physical Medicine and Rehabilitation

## 2023-10-25 DIAGNOSIS — R112 Nausea with vomiting, unspecified: Secondary | ICD-10-CM

## 2023-10-25 DIAGNOSIS — R1084 Generalized abdominal pain: Secondary | ICD-10-CM

## 2023-10-25 NOTE — Telephone Encounter (Signed)
 Patient called and said she is sick and needs to reschedule. CB#[phone]

## 2023-10-26 ENCOUNTER — Telehealth: Payer: Self-pay | Admitting: Physical Medicine and Rehabilitation

## 2023-10-26 NOTE — Telephone Encounter (Signed)
Patient called. She would like to [facility] her appointment with [facility].

## 2023-10-30 ENCOUNTER — Institutional Professional Consult (permissible substitution): Payer: Medicaid Other | Admitting: Adult Health

## 2023-11-01 ENCOUNTER — Encounter: Payer: Medicaid Other | Attending: Surgery | Admitting: *Deleted

## 2023-11-01 VITALS — BP 123/81 | HR 95 | Temp 97.4°F | Resp 16

## 2023-11-01 DIAGNOSIS — G43019 Migraine without aura, intractable, without status migrainosus: Secondary | ICD-10-CM | POA: Insufficient documentation

## 2023-11-01 MED ORDER — SODIUM CHLORIDE 0.9 % IV SOLN
100.0000 mg | Freq: Once | INTRAVENOUS | Status: DC
Start: 2023-11-01 — End: 2023-11-01
  Filled 2023-11-01: qty 1

## 2023-11-01 MED ORDER — SODIUM CHLORIDE 0.9 % IV SOLN
300.0000 mg | Freq: Once | INTRAVENOUS | Status: AC
  Administered 2023-11-01: 300 mg via INTRAVENOUS
  Filled 2023-11-01: qty 3

## 2023-11-01 NOTE — Progress Notes (Signed)
Diagnosis: Migraines  Provider:  Katrinka Blazing MD  Procedure: IV Infusion  IV Type: Peripheral, IV Location: L Hand  Vyepti (Eptinezumab-jjmr), Dose: 300 mg  Infusion Start Time: 1523  Infusion Stop Time: 1600  Post Infusion IV Care: Observation period completed  Discharge: Condition: Good, Destination: Home . AVS Provided  Performed by:  Daleen Squibb, RN

## 2023-11-07 ENCOUNTER — Telehealth: Payer: Self-pay | Admitting: Physical Medicine and Rehabilitation

## 2023-11-07 NOTE — Telephone Encounter (Signed)
Pt called requesting to reschedule her appointment with [facility]

## 2023-11-08 ENCOUNTER — Telehealth: Payer: Self-pay | Admitting: Physical Medicine and Rehabilitation

## 2023-11-08 NOTE — Telephone Encounter (Addendum)
Spoke to pt aware that PA Naratriptan was sent to PA team to be complete Pt states Dr Alvester Morin office called her today  and schedule her an appointment 11/09/2023  Pt thanked me for calling

## 2023-11-08 NOTE — Telephone Encounter (Signed)
Pt called another dr office stating we have no  called for to set an appt with Dr Alvester Morin. Please call pt and set appt. Pt phone number is [phone].

## 2023-11-09 ENCOUNTER — Encounter: Payer: Self-pay | Admitting: Physical Medicine and Rehabilitation

## 2023-11-09 ENCOUNTER — Ambulatory Visit (INDEPENDENT_AMBULATORY_CARE_PROVIDER_SITE_OTHER): Payer: Medicaid Other | Admitting: Physical Medicine and Rehabilitation

## 2023-11-09 DIAGNOSIS — M542 Cervicalgia: Secondary | ICD-10-CM | POA: Diagnosis not present

## 2023-11-09 DIAGNOSIS — M5481 Occipital neuralgia: Secondary | ICD-10-CM | POA: Diagnosis not present

## 2023-11-09 NOTE — Progress Notes (Signed)
Cindy Stevenson - 48 y.o. female MRN [id number]  Date of birth: 1976/08/28  Office Visit Note: Visit Date: 11/09/2023 PCP: Royann Shivers, PA-C Referred by: Royann Shivers, *  Subjective: No chief complaint on file.  HPI: Cindy Stevenson is a 48 y.o. female who comes in today per the request of Ihor Austin, NP for evaluation of chronic, worsening and severe bilateral neck pain radiating to occipital regions. States her pain radiates to both ears. Pain ongoing for several years. She reports no specific aggravating factors. She describes pain as sore, aching and shooting sensation, currently rates as 8 out of 10. Some relief of pain with medications. She has tried Baclofen, Robaxin and Flexeril. States she was unable to tolerate Flexeril. She was previously managed by Dr. Ocie Doyne with Neurology and did undergo several rounds of occipital nerve blocks with some short term relief of pain. She continues to see Ihor Austin, NP for management of chronic migraines. Patient denies focal weakness, numbness and tingling. No recent trauma or falls.      Review of Systems  Musculoskeletal:  Positive for myalgias and neck pain.  All other systems reviewed and are negative.  Otherwise per HPI.  Assessment & Plan: Visit Diagnoses:    ICD-10-CM   1. Cervicalgia  M54.2 Ambulatory referral to Physical Medicine Rehab    2. Bilateral occipital neuralgia  M54.81 Ambulatory referral to Physical Medicine Rehab       Plan: Findings:  Chronic, worsening and severe  bilateral neck pain radiating to occipital regions. Patient continues to have severe pain despite good conservative therapies such as medications. Short term relief with prior occipital nerve blocks. Patients clinical presentation and exam are consistent with occipital neuralgia. We discussed treatment options in detail. I explained to her that we can try a procedure called a third occipital nerve block. This is done at the  level of C2-C3. Next step is to perform diagnostic C2-C3 nerve blocks. If good relief of pain with diagnostic blocks we discussed possibility of longer sustained pain relief with radiofrequency ablation. Dr. Alvester Morin at bedside to discuss injection procedure, patient has no questions at this time. She can continue with Ihor Austin, NP for management of headaches. Can also continue with current medications. We will get her back for injections pending insurance approval. No red flag symptoms noted upon exam today.     Meds & Orders: No orders of the defined types were placed in this encounter.   Orders Placed This Encounter  Procedures   Ambulatory referral to Physical Medicine Rehab    Follow-up: Return for C2-C3 facet blocks.   Procedures: No procedures performed      Clinical History: No specialty comments available.   She reports that she has never smoked. She has never used smokeless tobacco.  Recent Labs    03/29/23 1435  HGBA1C 8.6*    Objective:  VS:  HT:    WT:   BMI:     BP:   HR: bpm  TEMP: ( )  RESP:  Physical Exam Vitals and nursing note reviewed.  HENT:     Head: Normocephalic and atraumatic.     Right Ear: External ear normal.     Left Ear: External ear normal.     Nose: Nose normal.     Mouth/Throat:     Mouth: Mucous membranes are moist.  Eyes:     Extraocular Movements: Extraocular movements intact.  Cardiovascular:     Rate and Rhythm: Normal  rate.     Pulses: Normal pulses.  Pulmonary:     Effort: Pulmonary effort is normal.  Abdominal:     General: Abdomen is flat. There is no distension.  Musculoskeletal:        General: Tenderness present.     Cervical back: Tenderness present.     Comments: Discomfort noted with flexion, extension and side-to-side rotation. Patient has good strength in the upper extremities including 5 out of 5 strength in wrist extension, long finger flexion and APB. Shoulder range of motion is full bilaterally without any  sign of impingement. There is no atrophy of the hands intrinsically. Sensation intact bilaterally. Negative Hoffman's sign. Negative Spurling's sign.     Skin:    General: Skin is warm and dry.     Capillary Refill: Capillary refill takes less than 2 seconds.  Neurological:     General: No focal deficit present.     Mental Status: She is alert and oriented to person, place, and time.  Psychiatric:        Mood and Affect: Mood normal.        Behavior: Behavior normal.     Ortho Exam  Imaging: No results found.  Past Medical/Family/Surgical/Social History: Medications & Allergies reviewed per EMR, new medications updated. Patient Active Problem List   Diagnosis Date Noted   Biliary colic 03/31/2023   Prolapsed internal hemorrhoids, grade 3 12/01/2022   Prolapsed internal hemorrhoids, grade 2 09/29/2022   HLD (hyperlipidemia) 08/18/2022   Constipation 07/18/2022   Abdominal pain, epigastric 07/18/2022   LLQ abdominal pain 07/18/2022   Hemorrhoids 03/11/2022   Vaginal irritation 12/09/2020   Screening for cardiovascular condition 12/09/2020   Anxiety and depression 12/09/2020   Mixed stress and urge urinary incontinence 12/09/2020   Low back pain 08/24/2020   Routine medical exam 05/14/2020   Encounter for screening fecal occult blood testing 05/14/2020   Current use of estrogen therapy 05/14/2020   Hot flashes 04/30/2020   Moody 04/30/2020   Common migraine with intractable migraine 06/11/2018   Status post abdominal supracervical subtotal hysterectomy 05/09/2017   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   Abnormal uterine bleeding (AUB) 03/09/2017   Depression 03/09/2017   Pelvic pain 03/09/2017   Irritable bowel syndrome 05/09/2016   GERD (gastroesophageal reflux disease) 05/09/2016   Fibroadenoma of right breast 08/26/2015   Vaginal discharge 08/06/2015   Yeast infection 08/06/2015   Breast nodule 08/06/2015   Breast pain, left 08/06/2015   Postop check  03/25/2015   Warts, genital 01/23/2015   Screening for STD (sexually transmitted disease) 03/27/2014   Burning with urination 02/27/2014   Hematuria 02/27/2014   SUI (stress urinary incontinence, female) 02/27/2014   Dysmenorrhea 02/27/2014   Generalized abdominal pain 09/06/2013   Rectal bleeding 09/06/2013   Past Medical History:  Diagnosis Date   Anxiety    Breast nodule 08/06/2015   Breast pain, left 08/06/2015   Burning with urination 02/27/2014   Common migraine with intractable migraine 06/11/2018   Depression    Diabetes mellitus without complication (HCC)    Dysmenorrhea 02/27/2014   Fibroadenoma of right breast 08/26/2015   GERD (gastroesophageal reflux disease)    Headache    Hematuria 02/27/2014   Hypertension    PTSD (post-traumatic stress disorder)    Screening for STD (sexually transmitted disease) 03/27/2014   Seasonal allergies    SUI (stress urinary incontinence, female) 02/27/2014   Vaginal discharge 08/06/2015   Warts, genital 01/23/2015   Yeast infection 08/06/2015  Family History  Problem Relation Age of Onset   Lung cancer Mother    Asthma Sister    Allergies Sister    Mental illness Maternal Aunt        depression, PTSD   Diabetes Maternal Grandmother    Cancer Maternal Grandfather        skin   Colon cancer Neg Hx    Past Surgical History:  Procedure Laterality Date   BALLOON DILATION N/A 03/24/2022   Procedure: BALLOON DILATION;  Surgeon: Lanelle Bal, DO;  Location: [facility];  Service: Endoscopy;  Laterality: N/A;   BILATERAL SALPINGECTOMY Bilateral 05/09/2017   Procedure: BILATERAL SALPINGECTOMY;  Surgeon: [name], MD;  Location: [facility];  Service: Gynecology;  Laterality: Bilateral;   CHOLECYSTECTOMY  03/2023   COLONOSCOPY N/A 09/10/2013   ZOX:WRUEA;/VWUJW internal hemorrhoids   COLONOSCOPY WITH PROPOFOL N/A 03/24/2022   Surgeon: Lanelle Bal, DO;   Hemorrhoids on perianal exam, internal hemorrhoids, otherwise normal  exam.  Repeat in 10 years.   ESOPHAGOGASTRODUODENOSCOPY N/A 09/10/2013   JXB:JYNWGNFA ring at the gastroesophagral juctions/mild non-erosive gastritis   ESOPHAGOGASTRODUODENOSCOPY  2017   Dr. Teena Dunk; Schatzki's ring in the distal esophagus, otherwise normal exam.   ESOPHAGOGASTRODUODENOSCOPY (EGD) WITH PROPOFOL N/A 03/24/2022   Surgeon: Lanelle Bal, DO;   Mild Schatzki's ring dilated, 2 gastric polyps removed, otherwise normal exam. Polyps were hyperplastic.   LABIOPLASTY  02/14/2012   Procedure: LABIAPLASTY;  Surgeon: [name], MD;  Location: [facility];  Service: Gynecology;  Laterality: Right;  of the right labia minora   POLYPECTOMY  03/24/2022   Procedure: POLYPECTOMY;  Surgeon: Lanelle Bal, DO;  Location: [facility];  Service: Endoscopy;;   SUPRACERVICAL ABDOMINAL HYSTERECTOMY N/A 05/09/2017   Procedure: HYSTERECTOMY SUPRACERVICAL ABDOMINAL;  Surgeon: [name], MD;  Location: [facility];  Service: Gynecology;  Laterality: N/A;   WISDOM TOOTH EXTRACTION  10/18/1995   Dr. Manson Passey   Social History   Occupational History   Occupation: disability    Employer: NOT EMPLOYED  Tobacco Use   Smoking status: Never   Smokeless tobacco: Never  Vaping Use   Vaping status: Never Used  Substance and Sexual Activity   Alcohol use: No   Drug use: No   Sexual activity: Not Currently    Birth control/protection: Surgical    Comment: [facility]

## 2023-11-11 ENCOUNTER — Other Ambulatory Visit: Payer: Self-pay | Admitting: Adult Health

## 2023-11-13 NOTE — Telephone Encounter (Signed)
Last seen on 09/07/23 Follow up scheduled on 03/26/24

## 2023-11-14 NOTE — Telephone Encounter (Signed)
Please arrange GES due to N/V. Hold Ozempic one week prior.

## 2023-11-14 NOTE — Addendum Note (Signed)
Addended by: Armstead Peaks on: 11/14/2023 11:31 AM   Modules accepted: Orders

## 2023-11-14 NOTE — Telephone Encounter (Signed)
Checked [employer] and no PA required

## 2023-11-15 ENCOUNTER — Other Ambulatory Visit: Payer: Self-pay | Admitting: Adult Health

## 2023-11-15 NOTE — Telephone Encounter (Signed)
Last seen on 09/07/23 Follow up scheduled on 03/26/24

## 2023-11-23 ENCOUNTER — Encounter (HOSPITAL_COMMUNITY): Payer: Self-pay

## 2023-11-23 ENCOUNTER — Telehealth: Payer: Self-pay

## 2023-11-23 ENCOUNTER — Other Ambulatory Visit (HOSPITAL_COMMUNITY): Payer: Self-pay

## 2023-11-23 ENCOUNTER — Encounter (HOSPITAL_COMMUNITY)
Admission: RE | Admit: 2023-11-23 | Discharge: 2023-11-23 | Disposition: A | Discharge status: Home / Self Care | Payer: Medicaid Other | Source: Ambulatory Visit | Attending: Gastroenterology | Admitting: Gastroenterology

## 2023-11-23 ENCOUNTER — Encounter: Payer: Self-pay | Admitting: Gastroenterology

## 2023-11-23 DIAGNOSIS — R112 Nausea with vomiting, unspecified: Secondary | ICD-10-CM | POA: Diagnosis present

## 2023-11-23 MED ORDER — TECHNETIUM TC 99M SULFUR COLLOID
2.0000 | Freq: Once | INTRAVENOUS | Status: AC | PRN
  Administered 2023-11-23: 2.1 via ORAL

## 2023-11-23 NOTE — Telephone Encounter (Signed)
 Noted.

## 2023-11-23 NOTE — Telephone Encounter (Signed)
 Pharmacy Patient Advocate Encounter   Received notification from Patient Advice Request messages that prior authorization for Naratriptan  HCl 2.5MG  tablets is required/requested.   Insurance verification completed.   The patient is insured through [facility] [place] .   Per test claim: PA required; PA submitted to above mentioned insurance via CoverMyMeds Key/confirmation #/EOC [id number] Status is pending

## 2023-11-23 NOTE — Telephone Encounter (Signed)
 Pharmacy Patient Advocate Encounter  Received notification from [employer] that Prior Authorization for Naratriptan  2.5mg  has been CANCELLED due to pa not needed.   Max of 12 tablets per 23 days  Last filled 11/11/2023, next fill 12/06/2023

## 2023-11-27 ENCOUNTER — Telehealth: Payer: Self-pay | Admitting: Physical Medicine and Rehabilitation

## 2023-11-27 ENCOUNTER — Institutional Professional Consult (permissible substitution): Payer: Medicaid Other | Admitting: Adult Health

## 2023-11-27 NOTE — Telephone Encounter (Signed)
 Pt called requesting to reschedule due to her being sick. Please cal pt to reschedule. Pt phone number is [phone].

## 2023-11-28 ENCOUNTER — Encounter: Payer: Medicaid Other | Admitting: Physical Medicine and Rehabilitation

## 2023-12-05 ENCOUNTER — Other Ambulatory Visit: Payer: Self-pay

## 2023-12-05 ENCOUNTER — Ambulatory Visit (INDEPENDENT_AMBULATORY_CARE_PROVIDER_SITE_OTHER): Payer: Medicaid Other | Admitting: Physical Medicine and Rehabilitation

## 2023-12-05 VITALS — BP 150/101 | HR 101

## 2023-12-05 DIAGNOSIS — M47812 Spondylosis without myelopathy or radiculopathy, cervical region: Secondary | ICD-10-CM

## 2023-12-05 MED ORDER — METHYLPREDNISOLONE ACETATE 40 MG/ML IJ SUSP
40.0000 mg | Freq: Once | INTRAMUSCULAR | Status: AC
  Administered 2023-12-05: 40 mg

## 2023-12-05 NOTE — Patient Instructions (Signed)

## 2023-12-05 NOTE — Progress Notes (Signed)
Pain Score-7 No allergies to contrast Dye No thinners

## 2023-12-12 NOTE — Procedures (Signed)
 Diagnostic Cervical Facet Joint Nerve Block with Fluoroscopic Guidance  Patient: Cindy Stevenson      Date of Birth: [birth year] MRN: [id number] PCP: Royann Shivers, PA-C      Visit Date: 12/05/2023   Universal Protocol:    Date/Time: 12/11/2509:53 AM  Consent Given By: the patient  Position: PRONE  Additional Comments: Vital signs were monitored before and after the procedure. Patient was prepped and draped in the usual sterile fashion. The correct patient, procedure, and site was verified.   Injection Procedure Details:   Procedure diagnoses: Cervical spondylosis without myelopathy [M47.812]   Meds Administered:  Meds ordered this encounter  Medications   methylPREDNISolone acetate (DEPO-MEDROL) injection 40 mg     Laterality: Bilateral  Location/Site:  C2-3  Needle size: 25 G  Needle type: Spinal  Needle Placement: Articular Pillar  Findings:  -Contrast Used: 0.5 mL iohexol 180 mg iodine/mL   -Comments: Excellent flow of contrast across the articular pillars without intravascular flow  Procedure Details: The fluoroscope beam was positioned to square off the endplates of the desired vertebral level to achieve a true AP position. The beam was then moved in a small "counter" oblique to the contralateral side with a small amount of caudal tilt to achieve a trajectory alignment with the desired nerves.  For each target described below the skin was anesthetized with 1 ml of 1% Lidocaine without epinephrine.   To block the facet joint nerve to C2, the needle was fluoroscopically positioned over the inferior lateral portion of the C2/3 facet joint nerve where the third occipital nerve (TON) lies.  To block the facet joint nerves from C3 through C7, the lateral masses of these respective levels were localized under fluoroscopic visualization.  A spinal needle was inserted down to the "waist" at the above mentioned cervical levels.  The  needle was then "walked off"  until it rested just lateral to the trough of the lateral mass of the medial branch nerve, which innervates the cervical facet joint.  To block the C8 facet joint nerve, the needle was fluoroscopically introduced onto the Tl transverse process at its most medial superior end.  After contact with periosteum and negative aspirate for blood and CSF, correct placement without intravascular or epidural spread was confirmed by Bi-planar images and  injecting 0.5 ml. of Omnipaque-240.  A spot radiograph was obtained of this image.  Next, a 0.5 ml. volume of 1% Lidocaine without Epinephrine was then injected.  Prior to the procedure, the patient was given a Pain Diary which was completed for baseline measurements.  After the procedure, the patient rated their pain every 30 minutes and will continue rating at this frequency for a total of 5 hours.  The patient has been asked to complete the Diary and return to [place] by mail, fax or hand delivered as soon as possible.   Additional Comments:  No complications occurred Dressing: Band-Aid    Post-procedure details: Patient was observed during the procedure. Post-procedure instructions were reviewed.  Patient left the clinic in stable condition.

## 2023-12-12 NOTE — Progress Notes (Signed)
 [name] - 48 y.o. female MRN [id number]  Date of birth: April 11, 1976  Office Visit Note: Visit Date: 12/05/2023 PCP: Royann Shivers, PA-C Referred by: Royann Shivers, *  Subjective: Chief Complaint  Patient presents with   Neck - Pain   HPI:  Cindy Stevenson is a 48 y.o. female who comes in today at the request of Ellin Goodie, FNP for planned Bilateral  C2-3 Cervical facet/medial branch block with fluoroscopic guidance.  The patient has failed conservative care including home exercise, medications, time and activity modification.  This injection will be diagnostic and hopefully therapeutic.  Please see requesting physician notes for further details and justification.  Exam has shown concordant pain with facet joint loading.   ROS Otherwise per HPI.  Assessment & Plan: Visit Diagnoses:    ICD-10-CM   1. Cervical spondylosis without myelopathy  M47.812 methylPREDNISolone acetate (DEPO-MEDROL) injection 40 mg    XR C-ARM NO REPORT    Facet Injection      Plan: No additional findings.   Meds & Orders:  Meds ordered this encounter  Medications   methylPREDNISolone acetate (DEPO-MEDROL) injection 40 mg    Orders Placed This Encounter  Procedures   Facet Injection   XR C-ARM NO REPORT    Follow-up: Return for visit to requesting provider as needed.   Procedures: No procedures performed  Diagnostic Cervical Facet Joint Nerve Block with Fluoroscopic Guidance  Patient: Cindy Stevenson      Date of Birth: Mar 09, 1976 MRN: [id number] PCP: Royann Shivers, PA-C      Visit Date: 12/05/2023   Universal Protocol:    Date/Time: 12/11/2509:53 AM  Consent Given By: the patient  Position: PRONE  Additional Comments: Vital signs were monitored before and after the procedure. Patient was prepped and draped in the usual sterile fashion. The correct patient, procedure, and site was verified.   Injection Procedure Details:   Procedure diagnoses:  Cervical spondylosis without myelopathy [M47.812]   Meds Administered:  Meds ordered this encounter  Medications   methylPREDNISolone acetate (DEPO-MEDROL) injection 40 mg     Laterality: Bilateral  Location/Site:  C2-3  Needle size: 25 G  Needle type: Spinal  Needle Placement: Articular Pillar  Findings:  -Contrast Used: 0.5 mL iohexol 180 mg iodine/mL   -Comments: Excellent flow of contrast across the articular pillars without intravascular flow  Procedure Details: The fluoroscope beam was positioned to square off the endplates of the desired vertebral level to achieve a true AP position. The beam was then moved in a small "counter" oblique to the contralateral side with a small amount of caudal tilt to achieve a trajectory alignment with the desired nerves.  For each target described below the skin was anesthetized with 1 ml of 1% Lidocaine without epinephrine.   To block the facet joint nerve to C2, the needle was fluoroscopically positioned over the inferior lateral portion of the C2/3 facet joint nerve where the third occipital nerve (TON) lies.  To block the facet joint nerves from C3 through C7, the lateral masses of these respective levels were localized under fluoroscopic visualization.  A spinal needle was inserted down to the "waist" at the above mentioned cervical levels.  The  needle was then "walked off" until it rested just lateral to the trough of the lateral mass of the medial branch nerve, which innervates the cervical facet joint.  To block the C8 facet joint nerve, the needle was fluoroscopically introduced onto the Tl transverse process at  its most medial superior end.  After contact with periosteum and negative aspirate for blood and CSF, correct placement without intravascular or epidural spread was confirmed by Bi-planar images and  injecting 0.5 ml. of Omnipaque-240.  A spot radiograph was obtained of this image.  Next, a 0.5 ml. volume of 1% Lidocaine  without Epinephrine was then injected.  Prior to the procedure, the patient was given a Pain Diary which was completed for baseline measurements.  After the procedure, the patient rated their pain every 30 minutes and will continue rating at this frequency for a total of 5 hours.  The patient has been asked to complete the Diary and return to [place] by mail, fax or hand delivered as soon as possible.   Additional Comments:  No complications occurred Dressing: Band-Aid    Post-procedure details: Patient was observed during the procedure. Post-procedure instructions were reviewed.  Patient left the clinic in stable condition.       Clinical History: No specialty comments available.     Objective:  VS:  HT:    WT:   BMI:     BP:(!) 150/101  HR:(!) 101bpm  TEMP: ( )  RESP:  Physical Exam Vitals and nursing note reviewed.  Constitutional:      General: She is not in acute distress.    Appearance: Normal appearance. She is not ill-appearing.  HENT:     Head: Normocephalic and atraumatic.     Right Ear: External ear normal.     Left Ear: External ear normal.  Eyes:     Extraocular Movements: Extraocular movements intact.  Cardiovascular:     Rate and Rhythm: Normal rate.     Pulses: Normal pulses.  Musculoskeletal:     Cervical back: Tenderness present. No rigidity.     Right lower leg: No edema.     Left lower leg: No edema.     Comments: Patient has good strength in the upper extremities including 5 out of 5 strength in wrist extension long finger flexion and APB.  There is no atrophy of the hands intrinsically.  There is a negative Hoffmann's test.   Lymphadenopathy:     Cervical: No cervical adenopathy.  Skin:    Findings: No erythema, lesion or rash.  Neurological:     General: No focal deficit present.     Mental Status: She is alert and oriented to person, place, and time.     Sensory: No sensory deficit.     Motor: No weakness or abnormal muscle tone.      Coordination: Coordination normal.  Psychiatric:        Mood and Affect: Mood normal.        Behavior: Behavior normal.      Imaging: No results found.

## 2023-12-14 ENCOUNTER — Ambulatory Visit: Payer: Medicaid Other | Admitting: Gastroenterology

## 2023-12-27 ENCOUNTER — Encounter: Payer: Self-pay | Admitting: Physical Medicine and Rehabilitation

## 2023-12-27 ENCOUNTER — Ambulatory Visit: Admitting: Physical Medicine and Rehabilitation

## 2023-12-27 VITALS — BP 159/99 | HR 105

## 2023-12-27 DIAGNOSIS — M47812 Spondylosis without myelopathy or radiculopathy, cervical region: Secondary | ICD-10-CM

## 2023-12-27 DIAGNOSIS — H9202 Otalgia, left ear: Secondary | ICD-10-CM | POA: Diagnosis not present

## 2023-12-27 DIAGNOSIS — M542 Cervicalgia: Secondary | ICD-10-CM

## 2023-12-27 DIAGNOSIS — M5481 Occipital neuralgia: Secondary | ICD-10-CM

## 2023-12-27 NOTE — Progress Notes (Addendum)
 Cindy Stevenson - 48 y.o. female MRN [id number]  Date of birth: 10-27-1975  Office Visit Note: Visit Date: 12/27/2023 PCP: Royann Shivers, PA-C Referred by: Royann Shivers, *  Subjective: Chief Complaint  Patient presents with   Lower Back - Pain   HPI: Cindy Stevenson is a 48 y.o. female who comes in today for evaluation of chronic, worsening and severe bilateral neck pain radiating to occipital regions. States her pain radiates to both ears. Pain ongoing for several years. She reports no specific aggravating factors. She describes pain as sore, aching and shooting sensation, currently rates as 6 out of 10. Some relief of pain with medications. She continues with physician directed home exercise regimen. She was previously managed by Dr. Ocie Doyne with Neurology and did undergo several rounds of occipital nerve blocks with some short term relief of pain. She continues to see Ihor Austin, NP for management of chronic migraines. She recently underwent bilateral C2-C3 facet joint blocks in our office on 12/05/2023. She reports greater than 100% relief of pain for about 2.5 weeks. Her pain has gradually started to return. She reports pain with showering when water gets inside of left ear. She was evaluated by ENT with [facility] in 2023, these notes can be reviewed further in EPIC. Patient denies focal weakness, numbness and tingling. No recent trauma or falls.      Review of Systems  HENT:  Positive for ear pain.   Musculoskeletal:  Positive for myalgias and neck pain.  Neurological:  Positive for headaches.  All other systems reviewed and are negative.  Otherwise per HPI.  Assessment & Plan: Visit Diagnoses:    ICD-10-CM   1. Cervicalgia  M54.2 Ambulatory referral to Physical Medicine Rehab    2. Cervical spondylosis without myelopathy  M47.812 Ambulatory referral to Physical Medicine Rehab    3. Bilateral occipital neuralgia  M54.81 Ambulatory referral to  Physical Medicine Rehab    4. Left ear pain  H92.02 Ambulatory referral to Physical Medicine Rehab       Plan: Findings:  Chronic, worsening and severe bilateral neck pain radiating to occipital regions. Patient continues to have severe pain despite good conservative therapies such as physician directed home exercise regimen, rest and use of medications.  Patients clinical presentation and exam are consistent with occipital neuralgia. Significant relief of pain for several weeks with recent third occipital blocks (C2-C3 facets). We discussed treatment plan in detail today. Next step is to repeat diagnostic bilateral C2-C3 under fluoroscopic guidance.If good relief of pain with diagnostic blocks we discussed possibility of longer sustained pain relief with radiofrequency ablation. I also encouraged patient to speak with PCP regarding continued left ear pain, would recommend second opinion with ENT as we do not feel her left ear is likely a separate issues. I do feel there is an underlying chronic pain/central sensitization syndrome working to exacerbate her symptoms. Should her pain continue would recommend referral back to neurology, would also consider referral to comprehensive pain management center. No red flag symptoms noted upon exam today.     Meds & Orders: No orders of the defined types were placed in this encounter.   Orders Placed This Encounter  Procedures   Ambulatory referral to Physical Medicine Rehab    Follow-up: Return for Bilateral C2-C3 facet blocks, will need driver, 30 min..   Procedures: No procedures performed      Clinical History: No specialty comments available.   She reports that she has never  smoked. She has never used smokeless tobacco.  Recent Labs    03/29/23 1435  HGBA1C 8.6*    Objective:  VS:  HT:    WT:   BMI:     BP:(!) 159/99  HR:(!) 105bpm  TEMP: ( )  RESP:  Physical Exam Vitals and nursing note reviewed.  HENT:     Head: Normocephalic  and atraumatic.     Right Ear: External ear normal.     Left Ear: External ear normal.     Nose: Nose normal.     Mouth/Throat:     Mouth: Mucous membranes are moist.  Eyes:     Pupils: Pupils are equal, round, and reactive to light.  Cardiovascular:     Rate and Rhythm: Normal rate.     Pulses: Normal pulses.  Pulmonary:     Effort: Pulmonary effort is normal.  Abdominal:     General: Abdomen is flat. There is no distension.  Musculoskeletal:        General: Tenderness present.     Cervical back: Tenderness present.     Comments: Discomfort noted with flexion, extension and side-to-side rotation. Patient has good strength in the upper extremities including 5 out of 5 strength in wrist extension, long finger flexion and APB. Shoulder range of motion is full bilaterally without any sign of impingement. There is no atrophy of the hands intrinsically. Sensation intact bilaterally. Negative Hoffman's sign. Negative Spurling's sign.   Skin:    General: Skin is warm and dry.     Capillary Refill: Capillary refill takes less than 2 seconds.  Neurological:     General: No focal deficit present.     Mental Status: She is alert and oriented to person, place, and time.  Psychiatric:        Mood and Affect: Mood normal.        Behavior: Behavior normal.     Ortho Exam  Imaging: No results found.  Past Medical/Family/Surgical/Social History: Medications & Allergies reviewed per EMR, new medications updated. Patient Active Problem List   Diagnosis Date Noted   Biliary colic 03/31/2023   Prolapsed internal hemorrhoids, grade 3 12/01/2022   Prolapsed internal hemorrhoids, grade 2 09/29/2022   HLD (hyperlipidemia) 08/18/2022   Constipation 07/18/2022   Abdominal pain, epigastric 07/18/2022   LLQ abdominal pain 07/18/2022   Hemorrhoids 03/11/2022   Vaginal irritation 12/09/2020   Screening for cardiovascular condition 12/09/2020   Anxiety and depression 12/09/2020   Mixed stress and  urge urinary incontinence 12/09/2020   Low back pain 08/24/2020   Routine medical exam 05/14/2020   Encounter for screening fecal occult blood testing 05/14/2020   Current use of estrogen therapy 05/14/2020   Hot flashes 04/30/2020   Moody 04/30/2020   Common migraine with intractable migraine 06/11/2018   Status post abdominal supracervical subtotal hysterectomy 05/09/2017   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   Abnormal uterine bleeding (AUB) 03/09/2017   Depression 03/09/2017   Pelvic pain 03/09/2017   Irritable bowel syndrome 05/09/2016   GERD (gastroesophageal reflux disease) 05/09/2016   Fibroadenoma of right breast 08/26/2015   Vaginal discharge 08/06/2015   Yeast infection 08/06/2015   Breast nodule 08/06/2015   Breast pain, left 08/06/2015   Postop check 03/25/2015   Warts, genital 01/23/2015   Screening for STD (sexually transmitted disease) 03/27/2014   Burning with urination 02/27/2014   Hematuria 02/27/2014   SUI (stress urinary incontinence, female) 02/27/2014   Dysmenorrhea 02/27/2014   Generalized abdominal pain 09/06/2013  Rectal bleeding 09/06/2013   Past Medical History:  Diagnosis Date   Anxiety    Breast nodule 08/06/2015   Breast pain, left 08/06/2015   Burning with urination 02/27/2014   Common migraine with intractable migraine 06/11/2018   Depression    Diabetes mellitus without complication (HCC)    Dysmenorrhea 02/27/2014   Fibroadenoma of right breast 08/26/2015   GERD (gastroesophageal reflux disease)    Headache    Hematuria 02/27/2014   Hypertension    PTSD (post-traumatic stress disorder)    Screening for STD (sexually transmitted disease) 03/27/2014   Seasonal allergies    SUI (stress urinary incontinence, female) 02/27/2014   Vaginal discharge 08/06/2015   Warts, genital 01/23/2015   Yeast infection 08/06/2015   Family History  Problem Relation Age of Onset   Lung cancer Mother    Asthma Sister    Allergies Sister    Mental  illness Maternal Aunt        depression, PTSD   Diabetes Maternal Grandmother    Cancer Maternal Grandfather        skin   Colon cancer Neg Hx    Past Surgical History:  Procedure Laterality Date   BALLOON DILATION N/A 03/24/2022   Procedure: BALLOON DILATION;  Surgeon: Lanelle Bal, DO;  Location: [facility];  Service: Endoscopy;  Laterality: N/A;   BILATERAL SALPINGECTOMY Bilateral 05/09/2017   Procedure: BILATERAL SALPINGECTOMY;  Surgeon: [name], MD;  Location: [facility];  Service: Gynecology;  Laterality: Bilateral;   CHOLECYSTECTOMY  03/2023   COLONOSCOPY N/A 09/10/2013   ZOX:WRUEA;/VWUJW internal hemorrhoids   COLONOSCOPY WITH PROPOFOL N/A 03/24/2022   Surgeon: Lanelle Bal, DO;   Hemorrhoids on perianal exam, internal hemorrhoids, otherwise normal exam.  Repeat in 10 years.   ESOPHAGOGASTRODUODENOSCOPY N/A 09/10/2013   JXB:JYNWGNFA ring at the gastroesophagral juctions/mild non-erosive gastritis   ESOPHAGOGASTRODUODENOSCOPY  2017   Dr. Teena Dunk; Schatzki's ring in the distal esophagus, otherwise normal exam.   ESOPHAGOGASTRODUODENOSCOPY (EGD) WITH PROPOFOL N/A 03/24/2022   Surgeon: Lanelle Bal, DO;   Mild Schatzki's ring dilated, 2 gastric polyps removed, otherwise normal exam. Polyps were hyperplastic.   LABIOPLASTY  02/14/2012   Procedure: LABIAPLASTY;  Surgeon: [name], MD;  Location: [facility];  Service: Gynecology;  Laterality: Right;  of the right labia minora   POLYPECTOMY  03/24/2022   Procedure: POLYPECTOMY;  Surgeon: Lanelle Bal, DO;  Location: [facility];  Service: Endoscopy;;   SUPRACERVICAL ABDOMINAL HYSTERECTOMY N/A 05/09/2017   Procedure: HYSTERECTOMY SUPRACERVICAL ABDOMINAL;  Surgeon: [name], MD;  Location: [facility];  Service: Gynecology;  Laterality: N/A;   WISDOM TOOTH EXTRACTION  10/18/1995   Dr. Manson Passey   Social History   Occupational History   Occupation: disability    Employer: NOT EMPLOYED  Tobacco Use    Smoking status: Never   Smokeless tobacco: Never  Vaping Use   Vaping status: Never Used  Substance and Sexual Activity   Alcohol use: No   Drug use: No   Sexual activity: Not Currently    Birth control/protection: Surgical    Comment: [facility]

## 2023-12-27 NOTE — Progress Notes (Unsigned)
 Pain Scale   Average Pain 6  Patient advised she was much better till she washed her hair and got water in her ear and now her pain has returned.

## 2023-12-28 ENCOUNTER — Ambulatory Visit: Admitting: Physical Medicine and Rehabilitation

## 2023-12-29 ENCOUNTER — Other Ambulatory Visit: Payer: Self-pay | Admitting: Neurology

## 2024-01-01 ENCOUNTER — Telehealth: Payer: Self-pay | Admitting: Physical Medicine and Rehabilitation

## 2024-01-01 NOTE — Telephone Encounter (Signed)
 Patient reports 100% relief of pain for 2.5 weeks with recent bilateral C2-C3 facet joint blocks. She reports increased functional ability post injection. Her pain remains severe, she does continue with physician directed home exercise regimen. She has tried multiple medications in the past without relief of pain.

## 2024-01-03 ENCOUNTER — Ambulatory Visit: Payer: Medicaid Other | Admitting: Gastroenterology

## 2024-01-08 ENCOUNTER — Other Ambulatory Visit: Payer: Self-pay

## 2024-01-08 ENCOUNTER — Other Ambulatory Visit: Payer: Self-pay | Admitting: Neurology

## 2024-01-08 ENCOUNTER — Ambulatory Visit: Admitting: Physical Medicine and Rehabilitation

## 2024-01-08 DIAGNOSIS — M542 Cervicalgia: Secondary | ICD-10-CM

## 2024-01-08 DIAGNOSIS — M47812 Spondylosis without myelopathy or radiculopathy, cervical region: Secondary | ICD-10-CM | POA: Diagnosis not present

## 2024-01-08 NOTE — Patient Instructions (Signed)

## 2024-01-08 NOTE — Progress Notes (Unsigned)
 Pain Scale   Average Pain 6        +Driver, -BT, -Dye Allergies.

## 2024-01-12 NOTE — Progress Notes (Signed)
 Cindy Stevenson - 48 y.o. female MRN [id number]  Date of birth: 20-Feb-1976  Office Visit Note: Visit Date: 01/08/2024 PCP: Royann Shivers, PA-C Referred by: Royann Shivers, *  Subjective: Chief Complaint  Patient presents with   Neck - Pain   HPI:  Cindy Stevenson is a 48 y.o. female who comes in today for planned repeat Bilateral C2-3 Cervical facet/medial branch block with fluoroscopic guidance.  The patient has failed conservative care including home exercise, medications, time and activity modification.  This injection will be diagnostic and hopefully therapeutic.  Please see requesting physician notes for further details and justification.  Exam shows concordant low back pain with facet joint loading and extension. Patient received more than 80% pain relief from prior injection. This would be the second block in a diagnostic double block paradigm.     Referring:Cindy Mayford Knife, FNP   ROS Otherwise per HPI.  Assessment & Plan: Visit Diagnoses:    ICD-10-CM   1. Cervical spondylosis without myelopathy  M47.812 XR C-ARM NO REPORT    Facet Injection    2. Cervicalgia  M54.2 XR C-ARM NO REPORT    Facet Injection      Plan: No additional findings.   Meds & Orders: No orders of the defined types were placed in this encounter.   Orders Placed This Encounter  Procedures   Facet Injection   XR C-ARM NO REPORT    Follow-up: Return for Review Pain Diary.   Procedures: No procedures performed  Diagnostic Cervical Facet Joint Nerve Block with Fluoroscopic Guidance  Patient: Cindy Stevenson      Date of Birth: May 31, 1976 MRN: [id number] PCP: Royann Shivers, PA-C      Visit Date: 01/08/2024   Universal Protocol:    Date/Time: 03/28/258:22 AM  Consent Given By: the patient  Position: PRONE  Additional Comments: Vital signs were monitored before and after the procedure. Patient was prepped and draped in the usual sterile fashion. The correct  patient, procedure, and site was verified.   Injection Procedure Details:   Procedure diagnoses: Cervical spondylosis without myelopathy [M47.812]   Meds Administered: No orders of the defined types were placed in this encounter.    Laterality: Bilateral  Location/Site:  C2-3  Needle size: 25 G  Needle type: Spinal  Needle Placement: Articular Pillar  Findings:  -Contrast Used: 0.5 mL iohexol 180 mg iodine/mL   -Comments: Excellent flow of contrast across the articular pillars without intravascular flow  Procedure Details: The fluoroscope beam was positioned to square off the endplates of the desired vertebral level to achieve a true AP position. The beam was then moved in a small "counter" oblique to the contralateral side with a small amount of caudal tilt to achieve a trajectory alignment with the desired nerves.  For each target described below the skin was anesthetized with 1 ml of 1% Lidocaine without epinephrine.   To block the facet joint nerve to C2, the needle was fluoroscopically positioned over the inferior lateral portion of the C2/3 facet joint nerve where the third occipital nerve (TON) lies.  After contact with periosteum and negative aspirate for blood and CSF, correct placement without intravascular or epidural spread was confirmed by Bi-planar images and  injecting 0.5 ml. of Omnipaque-240.  A spot radiograph was obtained of this image.  Next, a 0.5 ml. volume of 1% Lidocaine without Epinephrine was then injected.  Prior to the procedure, the patient was given a Pain Diary which was completed for  baseline measurements.  After the procedure, the patient rated their pain every 30 minutes and will continue rating at this frequency for a total of 5 hours.  The patient has been asked to complete the Diary and return to [place] by mail, fax or hand delivered as soon as possible.   Additional Comments:  The patient tolerated the procedure well Dressing: Band-Aid     Post-procedure details: Patient was observed during the procedure. Post-procedure instructions were reviewed.  Patient left the clinic in stable condition.       Clinical History: No specialty comments available.     Objective:  VS:  HT:    WT:   BMI:     BP:   HR: bpm  TEMP: ( )  RESP:  Physical Exam Vitals and nursing note reviewed.  Constitutional:      General: She is not in acute distress.    Appearance: Normal appearance. She is not ill-appearing.  HENT:     Head: Normocephalic and atraumatic.     Right Ear: External ear normal.     Left Ear: External ear normal.  Eyes:     Extraocular Movements: Extraocular movements intact.  Cardiovascular:     Rate and Rhythm: Normal rate.     Pulses: Normal pulses.  Musculoskeletal:     Cervical back: Tenderness present. No rigidity.     Right lower leg: No edema.     Left lower leg: No edema.     Comments: Patient has good strength in the upper extremities including 5 out of 5 strength in wrist extension long finger flexion and APB.  There is no atrophy of the hands intrinsically.  There is a negative Hoffmann's test.   Lymphadenopathy:     Cervical: No cervical adenopathy.  Skin:    Findings: No erythema, lesion or rash.  Neurological:     General: No focal deficit present.     Mental Status: She is alert and oriented to person, place, and time.     Sensory: No sensory deficit.     Motor: No weakness or abnormal muscle tone.     Coordination: Coordination normal.  Psychiatric:        Mood and Affect: Mood normal.        Behavior: Behavior normal.      Imaging: No results found.

## 2024-01-12 NOTE — Procedures (Signed)
 Diagnostic Cervical Facet Joint Nerve Block with Fluoroscopic Guidance  Patient: Cindy Stevenson      Date of Birth: 06-13-1976 MRN: [id number] PCP: Royann Shivers, PA-C      Visit Date: 01/08/2024   Universal Protocol:    Date/Time: 03/28/258:22 AM  Consent Given By: the patient  Position: PRONE  Additional Comments: Vital signs were monitored before and after the procedure. Patient was prepped and draped in the usual sterile fashion. The correct patient, procedure, and site was verified.   Injection Procedure Details:   Procedure diagnoses: Cervical spondylosis without myelopathy [M47.812]   Meds Administered: No orders of the defined types were placed in this encounter.    Laterality: Bilateral  Location/Site:  C2-3  Needle size: 25 G  Needle type: Spinal  Needle Placement: Articular Pillar  Findings:  -Contrast Used: 0.5 mL iohexol 180 mg iodine/mL   -Comments: Excellent flow of contrast across the articular pillars without intravascular flow  Procedure Details: The fluoroscope beam was positioned to square off the endplates of the desired vertebral level to achieve a true AP position. The beam was then moved in a small "counter" oblique to the contralateral side with a small amount of caudal tilt to achieve a trajectory alignment with the desired nerves.  For each target described below the skin was anesthetized with 1 ml of 1% Lidocaine without epinephrine.   To block the facet joint nerve to C2, the needle was fluoroscopically positioned over the inferior lateral portion of the C2/3 facet joint nerve where the third occipital nerve (TON) lies.  After contact with periosteum and negative aspirate for blood and CSF, correct placement without intravascular or epidural spread was confirmed by Bi-planar images and  injecting 0.5 ml. of Omnipaque-240.  A spot radiograph was obtained of this image.  Next, a 0.5 ml. volume of 1% Lidocaine without Epinephrine was  then injected.  Prior to the procedure, the patient was given a Pain Diary which was completed for baseline measurements.  After the procedure, the patient rated their pain every 30 minutes and will continue rating at this frequency for a total of 5 hours.  The patient has been asked to complete the Diary and return to [place] by mail, fax or hand delivered as soon as possible.   Additional Comments:  The patient tolerated the procedure well Dressing: Band-Aid    Post-procedure details: Patient was observed during the procedure. Post-procedure instructions were reviewed.  Patient left the clinic in stable condition.

## 2024-01-16 ENCOUNTER — Ambulatory Visit: Payer: Medicaid Other | Admitting: Adult Health

## 2024-01-17 ENCOUNTER — Telehealth: Payer: Self-pay

## 2024-01-17 ENCOUNTER — Telehealth: Payer: Self-pay | Admitting: Physical Medicine and Rehabilitation

## 2024-01-17 NOTE — Telephone Encounter (Signed)
 Pt called requesting a call back from [facility]. Pt states she discussed Aundra Millet will call her. Please call pt at [phone].

## 2024-01-17 NOTE — Telephone Encounter (Signed)
 Patient asking if Cindy Stevenson could call and go over injection affects from her recent injection. She advised she has to travel aprox 45 min for the appointment and she doesn't understand why she has one on Friday and then Monday. Please advise

## 2024-01-18 ENCOUNTER — Encounter: Payer: Self-pay | Admitting: *Deleted

## 2024-01-18 ENCOUNTER — Ambulatory Visit: Admitting: Gastroenterology

## 2024-01-18 ENCOUNTER — Encounter: Payer: Self-pay | Admitting: Gastroenterology

## 2024-01-18 ENCOUNTER — Telehealth: Payer: Self-pay | Admitting: *Deleted

## 2024-01-18 VITALS — BP 132/75 | HR 102 | Temp 99.0°F | Ht 61.0 in | Wt 224.2 lb

## 2024-01-18 DIAGNOSIS — R1310 Dysphagia, unspecified: Secondary | ICD-10-CM | POA: Diagnosis not present

## 2024-01-18 DIAGNOSIS — R1013 Epigastric pain: Secondary | ICD-10-CM | POA: Diagnosis not present

## 2024-01-18 DIAGNOSIS — R1901 Right upper quadrant abdominal swelling, mass and lump: Secondary | ICD-10-CM

## 2024-01-18 NOTE — H&P (View-Only) (Signed)
 Gastroenterology Office Note     Primary Care Physician:  Sheela Stack  Primary Gastroenterologist: Dr. Marletta Lor   Chief Complaint   Chief Complaint  Patient presents with   Follow-up     History of Present Illness   Cindy Stevenson is a 48 y.o. female presenting today with a history of hemorrhoids s/p banding, constipation, intermittent N/V/D, abdominal pain s/p cholecystectomy located pinpoint in right upper quadrant, returning today in follow-up     Right -sided abdominal pain constantly. Has pillow to put against her. Solid food dysphagia with chicken. Nexium BID. Stopped ozempic as thought was related to burps and abdominal pain. Last episode in January and was on Ozempic at that time. Will have epigastric pain across upper abdomen intermittently with severe burps, will have to vomit then have diarrhea. Occurs every few months or so. She has not had any additional spells while off Ozempic. We had requested an xray and stat labs when it occurred again, and she was unable to make it due to transportation. Puts her down for 2 days.     Afraid to eat because of fear of epigastric pain and the vomiting. Occasional ibuprofen. Weight loss of 7 lbs without Ozempic since Nov 2024.   Trulance: sometimes with looser stool. Better than Linzess. Doesn't think has tried Amitiza.  Pin-point palpation just prior to rib margin, size of small marble, significantly tender   Imaging:  Dec 2024 Outside CT IMPRESSION:  No acute findings within the abdomen or pelvis. Few liver cyst stable, benign. Gallbladder absent, normal CBD   3.4 cm benign-appearing left ovarian cyst, decreased in size  compared to previous study. No follow-up imaging recommended.  Reference: JACR 2020 Feb; 17(2):248-254   Tiny hiatal hernia.   GES normal Feb 2025  Endoscopic:  EGD June 2023: mild Schatzki's ring s/p dilation, 2 gastric hyperplastic polyps.  Colonoscopy June 2023: Hemorrhoids on  perianal exam, internal hemorrhoids, otherwise normal exam. Repeat in 10 years.    Past Medical History:  Diagnosis Date   Anxiety    Breast nodule 08/06/2015   Breast pain, left 08/06/2015   Burning with urination 02/27/2014   Common migraine with intractable migraine 06/11/2018   Depression    Diabetes mellitus without complication (HCC)    Dysmenorrhea 02/27/2014   Fibroadenoma of right breast 08/26/2015   GERD (gastroesophageal reflux disease)    Headache    Hematuria 02/27/2014   Hypertension    PTSD (post-traumatic stress disorder)    Screening for STD (sexually transmitted disease) 03/27/2014   Seasonal allergies    SUI (stress urinary incontinence, female) 02/27/2014   Vaginal discharge 08/06/2015   Warts, genital 01/23/2015   Yeast infection 08/06/2015    Past Surgical History:  Procedure Laterality Date   BALLOON DILATION N/A 03/24/2022   Procedure: BALLOON DILATION;  Surgeon: Lanelle Bal, DO;  Location: [facility];  Service: Endoscopy;  Laterality: N/A;   BILATERAL SALPINGECTOMY Bilateral 05/09/2017   Procedure: BILATERAL SALPINGECTOMY;  Surgeon: [name], MD;  Location: [facility];  Service: Gynecology;  Laterality: Bilateral;   CHOLECYSTECTOMY  03/2023   COLONOSCOPY N/A 09/10/2013   WUJ:WJXBJ;/YNWGN internal hemorrhoids   COLONOSCOPY WITH PROPOFOL N/A 03/24/2022   Surgeon: Lanelle Bal, DO;   Hemorrhoids on perianal exam, internal hemorrhoids, otherwise normal exam.  Repeat in 10 years.   ESOPHAGOGASTRODUODENOSCOPY N/A 09/10/2013   FAO:ZHYQMVHQ ring at the gastroesophagral juctions/mild non-erosive gastritis   ESOPHAGOGASTRODUODENOSCOPY  2017   Dr.  Teena Dunk; Schatzki's ring in the distal esophagus, otherwise normal exam.   ESOPHAGOGASTRODUODENOSCOPY (EGD) WITH PROPOFOL N/A 03/24/2022   Surgeon: Lanelle Bal, DO;   Mild Schatzki's ring dilated, 2 gastric polyps removed, otherwise normal exam. Polyps were hyperplastic.   LABIOPLASTY  02/14/2012    Procedure: LABIAPLASTY;  Surgeon: [name], MD;  Location: [facility];  Service: Gynecology;  Laterality: Right;  of the right labia minora   POLYPECTOMY  03/24/2022   Procedure: POLYPECTOMY;  Surgeon: Lanelle Bal, DO;  Location: [facility];  Service: Endoscopy;;   SUPRACERVICAL ABDOMINAL HYSTERECTOMY N/A 05/09/2017   Procedure: HYSTERECTOMY SUPRACERVICAL ABDOMINAL;  Surgeon: [name], MD;  Location: [facility];  Service: Gynecology;  Laterality: N/A;   WISDOM TOOTH EXTRACTION  10/18/1995   Dr. Manson Passey    Current Outpatient Medications  Medication Sig Dispense Refill   ACCU-CHEK GUIDE test strip      Accu-Chek Softclix Lancets lancets SMARTSIG:Topical     acetaminophen (TYLENOL) 500 MG tablet Take 2 tablets (1,000 mg total) by mouth every 6 (six) hours. 30 tablet 0   albuterol (VENTOLIN HFA) 108 (90 Base) MCG/ACT inhaler Inhale 2 puffs into the lungs every 6 (six) hours as needed for shortness of breath or wheezing.     ALPRAZolam (XANAX) 1 MG tablet Take 1 mg by mouth 3 (three) times daily.     baclofen (LIORESAL) 10 MG tablet Take 1 tablet (10 mg total) by mouth daily as needed (migraine). 15 each 11   Bismuth Subsalicylate (PEPTO-BISMOL) 262 MG TABS Take 262 mg by mouth daily as needed (indigestion).     cetirizine (ZYRTEC) 10 MG tablet Take 10 mg by mouth daily.     Diclofenac Potassium,Migraine, 50 MG PACK TAKE 50-100 MG BY MOUTH AS NEEDED FOR MIGRAINE (Max Daily Dose: 100 MG in 24 HOURS) 10 each 11   docusate sodium (COLACE) 100 MG capsule Take 1 capsule (100 mg total) by mouth 2 (two) times daily. 60 capsule 2   Eptinezumab-jjmr (VYEPTI) 100 MG/ML injection Inject 300 mg into the vein every 3 (three) months. Getting at [facility] [phone], fax [phone]     esomeprazole (NEXIUM) 40 MG capsule Take 40 mg by mouth 2 (two) times daily.      gabapentin (NEURONTIN) 400 MG capsule Take 400 mg by mouth 4 (four) times daily.     hydrochlorothiazide (HYDRODIURIL)  25 MG tablet Take 25 mg by mouth daily.     ibuprofen (ADVIL) 600 MG tablet Take 600 mg by mouth every 8 (eight) hours as needed for headache.     labetalol (NORMODYNE) 100 MG tablet Take 50 mg by mouth 2 (two) times daily.     melatonin 5 MG TABS Take 10 mg by mouth at bedtime.     mupirocin ointment (BACTROBAN) 2 % Apply 1 Application topically 3 (three) times daily.     naratriptan (AMERGE) 2.5 MG tablet TAKE 1 TABLET BY MOUTH AT ONSET of HEADACHE. if returns OR does not resolve, MAY REPEAT AFTER 4 HOURS (Max Daily Dose: 2 TABLETS in 24 HOURS) 12 tablet 6   nystatin (MYCOSTATIN/NYSTOP) powder Apply topically.     ondansetron (ZOFRAN) 8 MG tablet Take 8 mg by mouth every 8 (eight) hours as needed for nausea.     Plecanatide (TRULANCE) 3 MG TABS Take 1 tablet (3 mg total) by mouth daily. 90 tablet 3   Polyethyl Glycol-Propyl Glycol (SYSTANE OP) Place 1 drop into both eyes daily as needed (dry eyes).  Probiotic Product (PROBIOTIC DAILY PO) Take by mouth.     PROCTO-MED HC 2.5 % rectal cream APPLY 1 GRAM RECTALLY TWICE DAILY (Patient taking differently: Place 1 Application rectally 2 (two) times daily as needed for hemorrhoids.) 30 g 1   tiZANidine (ZANAFLEX) 2 MG tablet TAKE 1 TABLET BY MOUTH DAILY AS NEEDED FOR HEADACHE. STOP METHOCARBAMOL 30 tablet 3   No current facility-administered medications for this visit.    Allergies as of 01/18/2024 - Review Complete 01/18/2024  Allergen Reaction Noted   Effexor [venlafaxine] Rash and Hypertension 07/12/2021   Latex Dermatitis 05/09/2016   Codeine Nausea And Vomiting 02/13/2012   Shellfish allergy Hives 02/13/2012   Sulfa antibiotics Swelling 07/18/2022   Tape  03/27/2023   Amoxicillin Rash 01/23/2015   Ceftin [cefuroxime] Rash 12/09/2020    Family History  Problem Relation Age of Onset   Lung cancer Mother    Asthma Sister    Allergies Sister    Mental illness Maternal Aunt        depression, PTSD   Diabetes Maternal Grandmother     Cancer Maternal Grandfather        skin   Colon cancer Neg Hx     Social History   Socioeconomic History   Marital status: Single    Spouse name: Not on file   Number of children: Not on file   Years of education: Not on file   Highest education level: Not on file  Occupational History   Occupation: disability    Employer: NOT EMPLOYED  Tobacco Use   Smoking status: Never   Smokeless tobacco: Never  Vaping Use   Vaping status: Never Used  Substance and Sexual Activity   Alcohol use: No   Drug use: No   Sexual activity: Not Currently    Birth control/protection: Surgical    Comment: [facility]  Other Topics Concern   Not on file  Social History Narrative   Not on file   Social Drivers of Health   Financial Resource Strain: Low Risk  (11/24/2021)   Received from [facility], [facility]   Overall Financial Resource Strain (CARDIA)    Difficulty of Paying Living Expenses: Not hard at all  Food Insecurity: No Food Insecurity (11/24/2021)   Received from [facility], [facility]   Hunger Vital Sign    Worried About Running Out of Food in the Last Year: Never true    Ran Out of Food in the Last Year: Never true  Transportation Needs: No Transportation Needs (11/24/2021)   Received from [facility], [facility]   [facility] - Transportation    Lack of Transportation (Medical): No    Lack of Transportation (Non-Medical): No  Physical Activity: Inactive (08/09/2021)   Received from [facility], [facility]   Exercise Vital Sign    Days of Exercise per Week: 0 days    Minutes of Exercise per Session: 0 min  Stress: Stress Concern Present (08/09/2021)   Received from [facility], [facility] of Occupational Health - Occupational Stress Questionnaire    Feeling of Stress : Very much  Social Connections: Unknown (02/15/2022)   Received from [facility], [facility]   Social Network    Social Network: Not on file  Recent  Concern: Social Connections - Socially Isolated (11/24/2021)   Received from [facility], [facility]   Social Connection and Isolation Panel [NHANES]  Frequency of Communication with Friends and Family: More than three times a week    Frequency of Social Gatherings with Friends and Family: Once a week    Attends Religious Services: Never    Database administrator or Organizations: No    Attends Banker Meetings: Never    Marital Status: Never married  Intimate Partner Violence: Not At Risk (05/30/2022)   Received from [facility], [facility]   Humiliation, Afraid, Rape, and Kick questionnaire    Fear of Current or Ex-Partner: No    Emotionally Abused: No    Physically Abused: No    Sexually Abused: No     Review of Systems   Gen: Denies any fever, chills, fatigue, weight loss, lack of appetite.  CV: Denies chest pain, heart palpitations, peripheral edema, syncope.  Resp: Denies shortness of breath at rest or with exertion. Denies wheezing or cough.  GI: Denies dysphagia or odynophagia. Denies jaundice, hematemesis, fecal incontinence. GU : Denies urinary burning, urinary frequency, urinary hesitancy MS: Denies joint pain, muscle weakness, cramps, or limitation of movement.  Derm: Denies rash, itching, dry skin Psych: Denies depression, anxiety, memory loss, and confusion Heme: Denies bruising, bleeding, and enlarged lymph nodes.   Physical Exam   BP 132/75 (BP Location: Right Arm, Patient Position: Sitting, Cuff Size: Large)   Pulse (!) 102   Temp 99 F (37.2 C) (Oral)   Ht 5\' 1"  (1.549 m)   Wt 224 lb 3.2 oz (101.7 kg)   LMP 04/21/2017 Comment: SCH  SpO2 98%   BMI 42.36 kg/m  General:   Alert and oriented. Pleasant and cooperative. Well-nourished and well-developed.  Head:  Normocephalic and atraumatic. Eyes:  Without icterus Abdomen:  +BS, soft, non-distended. Pinpoint TTP with questionable small lipoma about size of small marble at just a  few fingerbreadths superior to costal margin (patient able to pinpoint with palpation as well). Reproduces pain.  Rectal:  Deferred  Msk:  Symmetrical without gross deformities. Normal posture. Extremities:  Without edema. Neurologic:  Alert and  oriented x4;  grossly normal neurologically. Skin:  Intact without significant lesions or rashes. Psych:  Alert and cooperative. Normal mood and affect.   Assessment   Cindy Stevenson is a 48 y.o. female presenting today with a history of hemorrhoids s/p banding, constipation, intermittent N/V/D, abdominal pain s/p cholecystectomy located pinpoint in right upper quadrant, returning today in follow-up  Now noting dysphagia with solid foods. Prior dilation in 2023 with good results. I also note she has had spells of epigastric pain with associated N/V while on Ozempic. I suspect this was the culprit and has not had another spell since discontinuing in January. Doubt dealing with PUD, GOO, etc. CT in Dec 2024 without concerning findings. Regardless, EGD planned for dysphagia and can also ensure nothing occult going on.   Constipation: continue Trulance for now. Some urgency at times. May need to consider Amitiza if willing in future. No improvement with LInzess.   Right-sided abdominal pain: on palpation, pain is reproduced at small marble size lipoma-like structure right-sided abdomen. Unclear etiology. ?Scar tissue. No CBD dilation, no concern for occult CBD stone. Gallbladder absent. Will do limited US of this area        PLAN    Proceed with upper endoscopy/dilation by Dr. Marletta Lor in near future: the risks, benefits, and alternatives have been discussed with the patient in detail. The patient states understanding and desires to proceed.  Limited US of right-sided area  of concern Further recommendations to follow    Gelene Mink, PhD, ANP-BC [facility]

## 2024-01-18 NOTE — Telephone Encounter (Signed)
 Availity PA:  Certification Number [id number]  Status PENDED  Reference Number [id number]  Review Reason 1 Disposition pending review

## 2024-01-18 NOTE — Telephone Encounter (Signed)
 Received fax from [facility]: No prior authorization is needed for procedure

## 2024-01-18 NOTE — Progress Notes (Signed)
 Gastroenterology Office Note     Primary Care Physician:  Sheela Stack  Primary Gastroenterologist: Dr. Marletta Lor   Chief Complaint   Chief Complaint  Patient presents with   Follow-up     History of Present Illness   Cindy Stevenson is a 48 y.o. female presenting today with a history of hemorrhoids s/p banding, constipation, intermittent N/V/D, abdominal pain s/p cholecystectomy located pinpoint in right upper quadrant, returning today in follow-up     Right -sided abdominal pain constantly. Has pillow to put against her. Solid food dysphagia with chicken. Nexium BID. Stopped ozempic as thought was related to burps and abdominal pain. Last episode in January and was on Ozempic at that time. Will have epigastric pain across upper abdomen intermittently with severe burps, will have to vomit then have diarrhea. Occurs every few months or so. She has not had any additional spells while off Ozempic. We had requested an xray and stat labs when it occurred again, and she was unable to make it due to transportation. Puts her down for 2 days.     Afraid to eat because of fear of epigastric pain and the vomiting. Occasional ibuprofen. Weight loss of 7 lbs without Ozempic since Nov 2024.   Trulance: sometimes with looser stool. Better than Linzess. Doesn't think has tried Amitiza.  Pin-point palpation just prior to rib margin, size of small marble, significantly tender   Imaging:  Dec 2024 Outside CT IMPRESSION:  No acute findings within the abdomen or pelvis. Few liver cyst stable, benign. Gallbladder absent, normal CBD   3.4 cm benign-appearing left ovarian cyst, decreased in size  compared to previous study. No follow-up imaging recommended.  Reference: JACR 2020 Feb; 17(2):248-254   Tiny hiatal hernia.   GES normal Feb 2025  Endoscopic:  EGD June 2023: mild Schatzki's ring s/p dilation, 2 gastric hyperplastic polyps.  Colonoscopy June 2023: Hemorrhoids on  perianal exam, internal hemorrhoids, otherwise normal exam. Repeat in 10 years.    Past Medical History:  Diagnosis Date   Anxiety    Breast nodule 08/06/2015   Breast pain, left 08/06/2015   Burning with urination 02/27/2014   Common migraine with intractable migraine 06/11/2018   Depression    Diabetes mellitus without complication (HCC)    Dysmenorrhea 02/27/2014   Fibroadenoma of right breast 08/26/2015   GERD (gastroesophageal reflux disease)    Headache    Hematuria 02/27/2014   Hypertension    PTSD (post-traumatic stress disorder)    Screening for STD (sexually transmitted disease) 03/27/2014   Seasonal allergies    SUI (stress urinary incontinence, female) 02/27/2014   Vaginal discharge 08/06/2015   Warts, genital 01/23/2015   Yeast infection 08/06/2015    Past Surgical History:  Procedure Laterality Date   BALLOON DILATION N/A 03/24/2022   Procedure: BALLOON DILATION;  Surgeon: Lanelle Bal, DO;  Location: [facility];  Service: Endoscopy;  Laterality: N/A;   BILATERAL SALPINGECTOMY Bilateral 05/09/2017   Procedure: BILATERAL SALPINGECTOMY;  Surgeon: [name], MD;  Location: [facility];  Service: Gynecology;  Laterality: Bilateral;   CHOLECYSTECTOMY  03/2023   COLONOSCOPY N/A 09/10/2013   WUJ:WJXBJ;/YNWGN internal hemorrhoids   COLONOSCOPY WITH PROPOFOL N/A 03/24/2022   Surgeon: Lanelle Bal, DO;   Hemorrhoids on perianal exam, internal hemorrhoids, otherwise normal exam.  Repeat in 10 years.   ESOPHAGOGASTRODUODENOSCOPY N/A 09/10/2013   FAO:ZHYQMVHQ ring at the gastroesophagral juctions/mild non-erosive gastritis   ESOPHAGOGASTRODUODENOSCOPY  2017   Dr.  Teena Dunk; Schatzki's ring in the distal esophagus, otherwise normal exam.   ESOPHAGOGASTRODUODENOSCOPY (EGD) WITH PROPOFOL N/A 03/24/2022   Surgeon: Lanelle Bal, DO;   Mild Schatzki's ring dilated, 2 gastric polyps removed, otherwise normal exam. Polyps were hyperplastic.   LABIOPLASTY  02/14/2012    Procedure: LABIAPLASTY;  Surgeon: [name], MD;  Location: [facility];  Service: Gynecology;  Laterality: Right;  of the right labia minora   POLYPECTOMY  03/24/2022   Procedure: POLYPECTOMY;  Surgeon: Lanelle Bal, DO;  Location: [facility];  Service: Endoscopy;;   SUPRACERVICAL ABDOMINAL HYSTERECTOMY N/A 05/09/2017   Procedure: HYSTERECTOMY SUPRACERVICAL ABDOMINAL;  Surgeon: [name], MD;  Location: [facility];  Service: Gynecology;  Laterality: N/A;   WISDOM TOOTH EXTRACTION  10/18/1995   Dr. Manson Passey    Current Outpatient Medications  Medication Sig Dispense Refill   ACCU-CHEK GUIDE test strip      Accu-Chek Softclix Lancets lancets SMARTSIG:Topical     acetaminophen (TYLENOL) 500 MG tablet Take 2 tablets (1,000 mg total) by mouth every 6 (six) hours. 30 tablet 0   albuterol (VENTOLIN HFA) 108 (90 Base) MCG/ACT inhaler Inhale 2 puffs into the lungs every 6 (six) hours as needed for shortness of breath or wheezing.     ALPRAZolam (XANAX) 1 MG tablet Take 1 mg by mouth 3 (three) times daily.     baclofen (LIORESAL) 10 MG tablet Take 1 tablet (10 mg total) by mouth daily as needed (migraine). 15 each 11   Bismuth Subsalicylate (PEPTO-BISMOL) 262 MG TABS Take 262 mg by mouth daily as needed (indigestion).     cetirizine (ZYRTEC) 10 MG tablet Take 10 mg by mouth daily.     Diclofenac Potassium,Migraine, 50 MG PACK TAKE 50-100 MG BY MOUTH AS NEEDED FOR MIGRAINE (Max Daily Dose: 100 MG in 24 HOURS) 10 each 11   docusate sodium (COLACE) 100 MG capsule Take 1 capsule (100 mg total) by mouth 2 (two) times daily. 60 capsule 2   Eptinezumab-jjmr (VYEPTI) 100 MG/ML injection Inject 300 mg into the vein every 3 (three) months. Getting at [facility] [phone], fax [phone]     esomeprazole (NEXIUM) 40 MG capsule Take 40 mg by mouth 2 (two) times daily.      gabapentin (NEURONTIN) 400 MG capsule Take 400 mg by mouth 4 (four) times daily.     hydrochlorothiazide (HYDRODIURIL)  25 MG tablet Take 25 mg by mouth daily.     ibuprofen (ADVIL) 600 MG tablet Take 600 mg by mouth every 8 (eight) hours as needed for headache.     labetalol (NORMODYNE) 100 MG tablet Take 50 mg by mouth 2 (two) times daily.     melatonin 5 MG TABS Take 10 mg by mouth at bedtime.     mupirocin ointment (BACTROBAN) 2 % Apply 1 Application topically 3 (three) times daily.     naratriptan (AMERGE) 2.5 MG tablet TAKE 1 TABLET BY MOUTH AT ONSET of HEADACHE. if returns OR does not resolve, MAY REPEAT AFTER 4 HOURS (Max Daily Dose: 2 TABLETS in 24 HOURS) 12 tablet 6   nystatin (MYCOSTATIN/NYSTOP) powder Apply topically.     ondansetron (ZOFRAN) 8 MG tablet Take 8 mg by mouth every 8 (eight) hours as needed for nausea.     Plecanatide (TRULANCE) 3 MG TABS Take 1 tablet (3 mg total) by mouth daily. 90 tablet 3   Polyethyl Glycol-Propyl Glycol (SYSTANE OP) Place 1 drop into both eyes daily as needed (dry eyes).  Probiotic Product (PROBIOTIC DAILY PO) Take by mouth.     PROCTO-MED HC 2.5 % rectal cream APPLY 1 GRAM RECTALLY TWICE DAILY (Patient taking differently: Place 1 Application rectally 2 (two) times daily as needed for hemorrhoids.) 30 g 1   tiZANidine (ZANAFLEX) 2 MG tablet TAKE 1 TABLET BY MOUTH DAILY AS NEEDED FOR HEADACHE. STOP METHOCARBAMOL 30 tablet 3   No current facility-administered medications for this visit.    Allergies as of 01/18/2024 - Review Complete 01/18/2024  Allergen Reaction Noted   Effexor [venlafaxine] Rash and Hypertension 07/12/2021   Latex Dermatitis 05/09/2016   Codeine Nausea And Vomiting 02/13/2012   Shellfish allergy Hives 02/13/2012   Sulfa antibiotics Swelling 07/18/2022   Tape  03/27/2023   Amoxicillin Rash 01/23/2015   Ceftin [cefuroxime] Rash 12/09/2020    Family History  Problem Relation Age of Onset   Lung cancer Mother    Asthma Sister    Allergies Sister    Mental illness Maternal Aunt        depression, PTSD   Diabetes Maternal Grandmother     Cancer Maternal Grandfather        skin   Colon cancer Neg Hx     Social History   Socioeconomic History   Marital status: Single    Spouse name: Not on file   Number of children: Not on file   Years of education: Not on file   Highest education level: Not on file  Occupational History   Occupation: disability    Employer: NOT EMPLOYED  Tobacco Use   Smoking status: Never   Smokeless tobacco: Never  Vaping Use   Vaping status: Never Used  Substance and Sexual Activity   Alcohol use: No   Drug use: No   Sexual activity: Not Currently    Birth control/protection: Surgical    Comment: [facility]  Other Topics Concern   Not on file  Social History Narrative   Not on file   Social Drivers of Health   Financial Resource Strain: Low Risk  (11/24/2021)   Received from [facility], [facility]   Overall Financial Resource Strain (CARDIA)    Difficulty of Paying Living Expenses: Not hard at all  Food Insecurity: No Food Insecurity (11/24/2021)   Received from [facility], [facility]   Hunger Vital Sign    Worried About Running Out of Food in the Last Year: Never true    Ran Out of Food in the Last Year: Never true  Transportation Needs: No Transportation Needs (11/24/2021)   Received from [facility], [facility]   [facility] - Transportation    Lack of Transportation (Medical): No    Lack of Transportation (Non-Medical): No  Physical Activity: Inactive (08/09/2021)   Received from [facility], [facility]   Exercise Vital Sign    Days of Exercise per Week: 0 days    Minutes of Exercise per Session: 0 min  Stress: Stress Concern Present (08/09/2021)   Received from [facility], [facility] of Occupational Health - Occupational Stress Questionnaire    Feeling of Stress : Very much  Social Connections: Unknown (02/15/2022)   Received from [facility], [facility]   Social Network    Social Network: Not on file  Recent  Concern: Social Connections - Socially Isolated (11/24/2021)   Received from [facility], [facility]   Social Connection and Isolation Panel [NHANES]  Frequency of Communication with Friends and Family: More than three times a week    Frequency of Social Gatherings with Friends and Family: Once a week    Attends Religious Services: Never    Database administrator or Organizations: No    Attends Banker Meetings: Never    Marital Status: Never married  Intimate Partner Violence: Not At Risk (05/30/2022)   Received from [facility], [facility]   Humiliation, Afraid, Rape, and Kick questionnaire    Fear of Current or Ex-Partner: No    Emotionally Abused: No    Physically Abused: No    Sexually Abused: No     Review of Systems   Gen: Denies any fever, chills, fatigue, weight loss, lack of appetite.  CV: Denies chest pain, heart palpitations, peripheral edema, syncope.  Resp: Denies shortness of breath at rest or with exertion. Denies wheezing or cough.  GI: Denies dysphagia or odynophagia. Denies jaundice, hematemesis, fecal incontinence. GU : Denies urinary burning, urinary frequency, urinary hesitancy MS: Denies joint pain, muscle weakness, cramps, or limitation of movement.  Derm: Denies rash, itching, dry skin Psych: Denies depression, anxiety, memory loss, and confusion Heme: Denies bruising, bleeding, and enlarged lymph nodes.   Physical Exam   BP 132/75 (BP Location: Right Arm, Patient Position: Sitting, Cuff Size: Large)   Pulse (!) 102   Temp 99 F (37.2 C) (Oral)   Ht 5\' 1"  (1.549 m)   Wt 224 lb 3.2 oz (101.7 kg)   LMP 04/21/2017 Comment: SCH  SpO2 98%   BMI 42.36 kg/m  General:   Alert and oriented. Pleasant and cooperative. Well-nourished and well-developed.  Head:  Normocephalic and atraumatic. Eyes:  Without icterus Abdomen:  +BS, soft, non-distended. Pinpoint TTP with questionable small lipoma about size of small marble at just a  few fingerbreadths superior to costal margin (patient able to pinpoint with palpation as well). Reproduces pain.  Rectal:  Deferred  Msk:  Symmetrical without gross deformities. Normal posture. Extremities:  Without edema. Neurologic:  Alert and  oriented x4;  grossly normal neurologically. Skin:  Intact without significant lesions or rashes. Psych:  Alert and cooperative. Normal mood and affect.   Assessment   Trinitie JAYMI TINNER is a 48 y.o. female presenting today with a history of hemorrhoids s/p banding, constipation, intermittent N/V/D, abdominal pain s/p cholecystectomy located pinpoint in right upper quadrant, returning today in follow-up  Now noting dysphagia with solid foods. Prior dilation in 2023 with good results. I also note she has had spells of epigastric pain with associated N/V while on Ozempic. I suspect this was the culprit and has not had another spell since discontinuing in January. Doubt dealing with PUD, GOO, etc. CT in Dec 2024 without concerning findings. Regardless, EGD planned for dysphagia and can also ensure nothing occult going on.   Constipation: continue Trulance for now. Some urgency at times. May need to consider Amitiza if willing in future. No improvement with LInzess.   Right-sided abdominal pain: on palpation, pain is reproduced at small marble size lipoma-like structure right-sided abdomen. Unclear etiology. ?Scar tissue. No CBD dilation, no concern for occult CBD stone. Gallbladder absent. Will do limited US of this area        PLAN    Proceed with upper endoscopy/dilation by Dr. Marletta Lor in near future: the risks, benefits, and alternatives have been discussed with the patient in detail. The patient states understanding and desires to proceed.  Limited US of right-sided area  of concern Further recommendations to follow    Gelene Mink, PhD, ANP-BC [facility]

## 2024-01-18 NOTE — Patient Instructions (Signed)
 I have ordered an ultrasound of the little area under your skin (the diagnosis says mass, but that's just a term to help them know what they are looking for).   We are arranging an upper endoscopy with dilation by Dr. Marletta Lor in the near future!   I enjoyed seeing you again today! I value our relationship and want to provide genuine, compassionate, and quality care. You may receive a survey regarding your visit with me, and I welcome your feedback! Thanks so much for taking the time to complete this. I look forward to seeing you again.      Gelene Mink, PhD, ANP-BC [facility]

## 2024-01-19 ENCOUNTER — Encounter: Payer: Self-pay | Admitting: Physical Medicine and Rehabilitation

## 2024-01-19 ENCOUNTER — Ambulatory Visit: Admitting: Physical Medicine and Rehabilitation

## 2024-01-19 DIAGNOSIS — M542 Cervicalgia: Secondary | ICD-10-CM | POA: Diagnosis not present

## 2024-01-19 DIAGNOSIS — M47812 Spondylosis without myelopathy or radiculopathy, cervical region: Secondary | ICD-10-CM | POA: Diagnosis not present

## 2024-01-19 DIAGNOSIS — H9202 Otalgia, left ear: Secondary | ICD-10-CM | POA: Diagnosis not present

## 2024-01-19 DIAGNOSIS — G8929 Other chronic pain: Secondary | ICD-10-CM

## 2024-01-19 DIAGNOSIS — M5481 Occipital neuralgia: Secondary | ICD-10-CM

## 2024-01-19 DIAGNOSIS — M5441 Lumbago with sciatica, right side: Secondary | ICD-10-CM

## 2024-01-19 NOTE — Progress Notes (Signed)
 Pain Scale   Average Pain 0        +Driver, -BT, -Dye Allergies.

## 2024-01-19 NOTE — Progress Notes (Addendum)
 Cindy Stevenson - 48 y.o. female MRN [id number]  Date of birth: 20-Dec-1975  Office Visit Note: Visit Date: 01/19/2024 PCP: Lizabeth Riggs, PA-C Referred by: Lizabeth Riggs, *  Subjective: Chief Complaint  Patient presents with   Neck - Follow-up    injection   Lower Back - Pain   HPI: Cindy Stevenson is a 48 y.o. female who comes in today for evaluation of chronic, worsening and severe bilateral neck pain radiating to occipital regions. States her pain radiates to both ears. Pain ongoing for several years. She reports no specific aggravating factors. She describes pain as sore, aching and shooting sensation, 7 out of 10 at this time. Also reports decreased functional ability with everyday tasks and difficulty sleeping. Some relief of pain with medications. She continues with physician directed home exercise regimen. She was previously managed by Dr. Dala Dublin with Neurology and did undergo several rounds of occipital nerve blocks with some short term relief of pain. She continues to see Johny Nap, NP for management of chronic migraines. She recently underwent 2 sets of bilateral C2-C3 facet joint blocks in our office, she reports significant relief of pain of with these injections, greater than 80%. She was evaluated by ENT with [facility] in 2023, these notes can be reviewed further in EP[facility].   She also reports chronic, worsening and severe bilateral lower back pain radiating to right buttock. Pain ongoing intermittently for several years. Her pain worsens with standing to perform household tasks such as cooking and washing dishes. Her pain also becomes severe with walking. She describes her pain as aching and burning sensation, currently rates as 2 out of 10. Some relief of pain with home exercise regimen, rest and use of medications. No history of formal physical therapy. Lumbar radiographs of 2023 show normal anatomical alignment, well preserved disc spacing, no  spondylolisthesis noted. There is mild degenerative changes at L3-L4 with slight endplate osteophyte formation. No history of lumbar surgery/injections. Patient denies focal weakness, numbness and tingling. No recent trauma or falls.        Review of Systems  Musculoskeletal:  Positive for back pain, myalgias and neck pain.  Neurological:  Negative for tingling, sensory change, focal weakness and weakness.  All other systems reviewed and are negative.  Otherwise per HPI.  Assessment & Plan: Visit Diagnoses:    ICD-10-CM   1. Cervicalgia  M54.2 Ambulatory referral to Physical Medicine Rehab    2. Cervical spondylosis without myelopathy  M47.812 Ambulatory referral to Physical Medicine Rehab    3. Bilateral occipital neuralgia  M54.81 Ambulatory referral to Physical Medicine Rehab    4. Left ear pain  H92.02 Ambulatory referral to Physical Medicine Rehab    5. Chronic bilateral low back pain with right-sided sciatica  G89.29 Ambulatory referral to Physical Therapy   M54.41        Plan: Findings:  1. Chronic, worsening and severe bilateral neck pain radiating to occipital regions. Significant pain relief with recent third occipital blocks (C2-C3 facets). She received considerable pain relief with both sets of diagnostic blocks. She continues with conservative therapies such as physician directed home exercise regimen, rest and use of medications. Patients clinical presentation and exam are consistent with occipital neuralgia. We discussed treatment plan in detail. Next step is to perform C2-C3 radiofrequency ablation under fluoroscopic guidance. I do feel there is an underlying chronic pain/central sensitization syndrome working to exacerbate her symptoms. Should her pain continue would recommend referral back to neurology,  would also consider referral to comprehensive pain management center. No red flag symptoms noted upon exam today.   2. Chronic, worsening and severe bilateral lower  back pain radiating to right buttock. Patient continues to have severe pain despite good conservative therapies. We discussed treatment plan in detail today. Lumbar radiographs from 2023 look fairly normal for her age. I would like for patient to attend short course of formal physical therapy. I did go ahead and place referral today. She did voice issues with physical therapy in the past, she feels exercises cause her extreme pain. I also feel there is a possibility of underlying central sensitization syndrome. Should her pain persist would consider obtaining lumbar MRI imaging. I will see her back post PT for follow up.     Meds & Orders: No orders of the defined types were placed in this encounter.   Orders Placed This Encounter  Procedures   Ambulatory referral to Physical Medicine Rehab   Ambulatory referral to Physical Therapy    Follow-up: Return for Bilateral C2-C2 facet joint injections.   Procedures: No procedures performed      Clinical History: No specialty comments available.   She reports that she has never smoked. She has never used smokeless tobacco.  Recent Labs    03/29/23 1435  HGBA1C 8.6*    Objective:  VS:  HT:    WT:   BMI:     BP:   HR: bpm  TEMP: ( )  RESP:  Physical Exam Vitals and nursing note reviewed.  HENT:     Head: Normocephalic and atraumatic.     Right Ear: External ear normal.     Left Ear: External ear normal.     Nose: Nose normal.     Mouth/Throat:     Mouth: Mucous membranes are moist.  Eyes:     Extraocular Movements: Extraocular movements intact.  Cardiovascular:     Rate and Rhythm: Normal rate.     Pulses: Normal pulses.  Pulmonary:     Effort: Pulmonary effort is normal.  Abdominal:     General: Abdomen is flat. There is no distension.  Musculoskeletal:        General: Tenderness present.     Cervical back: Tenderness present.     Comments: No discomfort noted with flexion, extension and side-to-side rotation. Patient has  good strength in the upper extremities including 5 out of 5 strength in wrist extension, long finger flexion and APB. Shoulder range of motion is full bilaterally without any sign of impingement. There is no atrophy of the hands intrinsically. Sensation intact bilaterally. Negative Hoffman's sign. Negative Spurling's sign.   Patient rises from seated position to standing without difficulty. Good lumbar range of motion. No pain noted with facet loading. 5/5 strength noted with bilateral hip flexion, knee flexion/extension, ankle dorsiflexion/plantarflexion and EHL. No clonus noted bilaterally. No pain upon palpation of greater trochanters. No pain with internal/external rotation of bilateral hips. Sensation intact bilaterally. Myofascial tenderness noted to bilateral lumbar paraspinal regions upon exam today. Negative slump test bilaterally. Ambulates without aid, gait steady.       Skin:    General: Skin is warm and dry.     Capillary Refill: Capillary refill takes less than 2 seconds.  Neurological:     General: No focal deficit present.     Mental Status: She is alert and oriented to person, place, and time.  Psychiatric:        Mood and Affect: Mood normal.  Behavior: Behavior normal.     Ortho Exam  Imaging: No results found.  Past Medical/Family/Surgical/Social History: Medications & Allergies reviewed per EMR, new medications updated. Patient Active Problem List   Diagnosis Date Noted   Abdominal wall mass of right upper quadrant 01/18/2024   Dysphagia 01/18/2024   Dyspepsia 01/18/2024   Biliary colic 03/31/2023   Prolapsed internal hemorrhoids, grade 3 12/01/2022   Prolapsed internal hemorrhoids, grade 2 09/29/2022   HLD (hyperlipidemia) 08/18/2022   Constipation 07/18/2022   Abdominal pain, epigastric 07/18/2022   LLQ abdominal pain 07/18/2022   Hemorrhoids 03/11/2022   Vaginal irritation 12/09/2020   Screening for cardiovascular condition 12/09/2020   Anxiety and  depression 12/09/2020   Mixed stress and urge urinary incontinence 12/09/2020   Low back pain 08/24/2020   Routine medical exam 05/14/2020   Encounter for screening fecal occult blood testing 05/14/2020   Current use of estrogen therapy 05/14/2020   Hot flashes 04/30/2020   Moody 04/30/2020   Common migraine with intractable migraine 06/11/2018   Status post abdominal supracervical subtotal hysterectomy 05/09/2017   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   Abnormal uterine bleeding (AUB) 03/09/2017   Depression 03/09/2017   Pelvic pain 03/09/2017   Irritable bowel syndrome 05/09/2016   GERD (gastroesophageal reflux disease) 05/09/2016   Fibroadenoma of right breast 08/26/2015   Vaginal discharge 08/06/2015   Yeast infection 08/06/2015   Breast nodule 08/06/2015   Breast pain, left 08/06/2015   Postop check 03/25/2015   Warts, genital 01/23/2015   Screening for STD (sexually transmitted disease) 03/27/2014   Burning with urination 02/27/2014   Hematuria 02/27/2014   SUI (stress urinary incontinence, female) 02/27/2014   Dysmenorrhea 02/27/2014   Generalized abdominal pain 09/06/2013   Rectal bleeding 09/06/2013   Past Medical History:  Diagnosis Date   Anxiety    Breast nodule 08/06/2015   Breast pain, left 08/06/2015   Burning with urination 02/27/2014   Common migraine with intractable migraine 06/11/2018   Depression    Diabetes mellitus without complication (HCC)    Dysmenorrhea 02/27/2014   Fibroadenoma of right breast 08/26/2015   GERD (gastroesophageal reflux disease)    Headache    Hematuria 02/27/2014   Hypertension    PTSD (post-traumatic stress disorder)    Screening for STD (sexually transmitted disease) 03/27/2014   Seasonal allergies    SUI (stress urinary incontinence, female) 02/27/2014   Vaginal discharge 08/06/2015   Warts, genital 01/23/2015   Yeast infection 08/06/2015   Family History  Problem Relation Age of Onset   Lung cancer Mother     Asthma Sister    Allergies Sister    Mental illness Maternal Aunt        depression, PTSD   Diabetes Maternal Grandmother    Cancer Maternal Grandfather        skin   Colon cancer Neg Hx    Past Surgical History:  Procedure Laterality Date   BALLOON DILATION N/A 03/24/2022   Procedure: BALLOON DILATION;  Surgeon: [name], DO;  Location: [facility];  Service: Endoscopy;  Laterality: N/A;   BILATERAL SALPINGECTOMY Bilateral 05/09/2017   Procedure: BILATERAL SALPINGECTOMY;  Surgeon: Albino Hum, MD;  Location: [facility];  Service: Gynecology;  Laterality: Bilateral;   CHOLECYSTECTOMY  03/2023   COLONOSCOPY N/A 09/10/2013   BJY:NWGNF;/AOZHY internal hemorrhoids   COLONOSCOPY WITH PROPOFOL N/A 03/24/2022   Surgeon: [name], DO;   Hemorrhoids on perianal exam, internal hemorrhoids, otherwise normal exam.  Repeat in 10 years.  ESOPHAGOGASTRODUODENOSCOPY N/A 09/10/2013   MWN:UUVOZDGU ring at the gastroesophagral juctions/mild non-erosive gastritis   ESOPHAGOGASTRODUODENOSCOPY  2017   Dr. Alline Ivans; Schatzki's ring in the distal esophagus, otherwise normal exam.   ESOPHAGOGASTRODUODENOSCOPY (EGD) WITH PROPOFOL N/A 03/24/2022   Surgeon: [name], DO;   Mild Schatzki's ring dilated, 2 gastric polyps removed, otherwise normal exam. Polyps were hyperplastic.   LABIOPLASTY  02/14/2012   Procedure: LABIAPLASTY;  Surgeon: Albino Hum, MD;  Location: [facility];  Service: Gynecology;  Laterality: Right;  of the right labia minora   POLYPECTOMY  03/24/2022   Procedure: POLYPECTOMY;  Surgeon: [name], DO;  Location: [facility];  Service: Endoscopy;;   SUPRACERVICAL ABDOMINAL HYSTERECTOMY N/A 05/09/2017   Procedure: HYSTERECTOMY SUPRACERVICAL ABDOMINAL;  Surgeon: Albino Hum, MD;  Location: [facility];  Service: Gynecology;  Laterality: N/A;   WISDOM TOOTH EXTRACTION  10/18/1995   Dr. Bevin Bucks   Social History   Occupational History   Occupation:  disability    Employer: NOT EMPLOYED  Tobacco Use   Smoking status: Never   Smokeless tobacco: Never  Vaping Use   Vaping status: Never Used  Substance and Sexual Activity   Alcohol use: No   Drug use: No   Sexual activity: Not Currently    Birth control/protection: Surgical    Comment: [employer]

## 2024-01-22 ENCOUNTER — Ambulatory Visit: Admitting: Physical Medicine and Rehabilitation

## 2024-01-29 NOTE — Patient Instructions (Signed)
 Cindy Stevenson  01/29/2024       Your procedure is scheduled on Monday, 02/05/24.   Report to [facility] Main Entrance at 7:15 A.M.   Call this number if you have problems the morning of surgery:  [phone]  If you experience any cold or flu symptoms such as cough, fever, chills, shortness of breath, etc. between now and your scheduled surgery, please notify us at the above number.   Remember:   Do not eat after midnight.   You may drink clear liquids until 5:15 am 02/05/24.    Clear liquids allowed are:  Water, Juice (No red color; non-citric and without pulp; diabetics please choose diet or no sugar options), Carbonated beverages (diabetics please choose diet or no sugar options), Clear Tea (No creamer, milk, or cream, including half & half and powdered creamer), Black Coffee Only (No creamer, milk or cream, including half & half and powdered creamer), and Clear Sports drink (No red color; diabetics please choose diet or no sugar options)    Take these medicines the morning of surgery with A SIP OF WATER    ALPRAZolam,cetirizine,labetalol,gabapentin,esomeprazole  albuterol inhaler use day of surgery and bring this with you    Do not wear jewelry, make-up or nail polish, including gel polish,  artificial nails, or any other type of covering on natural nails (fingers and  toes).  Do not wear lotions, powders, or perfumes, or deodorant.  Do not shave 48 hours prior to surgery.   Do not bring valuables to the hospital.  [facility] is not responsible for any belongings or valuables.  Contacts, dentures or bridgework may not be worn into surgery.  Leave your suitcase in the car.  After surgery it may be brought to your room.  For patients admitted to the hospital, discharge time will be determined by your treatment team.  Patients discharged the day of surgery will not be allowed to drive home and must have someone with them for 24 hours.   Special instructions:  DO NOT  SMOKE TOBACCO OR VAPE FOR 24 HOURS BEFORE YOUR PROCEDURE  Please read over the following fact sheets that you were given. Anesthesia Post-op Instructions and Care and Recovery After Surgery  Upper Endoscopy, Adult, Care After After the procedure, it is common to have a sore throat. It is also common to have: Mild stomach pain or discomfort. Bloating. Nausea. Follow these instructions at home: The instructions below may help you care for yourself at home. Your health care provider may give you more instructions. If you have questions, ask your health care provider. If you were given a sedative during the procedure, it can affect you for several hours. Do not drive or operate machinery until your health care provider says that it is safe. If you will be going home right after the procedure, plan to have a responsible adult: Take you home from the hospital or clinic. You will not be allowed to drive. Care for you for the time you are told. Follow instructions from your health care provider about what you may eat and drink. Return to your normal activities as told by your health care provider. Ask your health care provider what activities are safe for you. Take over-the-counter and prescription medicines only as told by your health care provider. Contact a health care provider if you: Have a sore throat that lasts longer than one day. Have trouble swallowing. Have a fever. Get help right away if you: Vomit blood  or your vomit looks like coffee grounds. Have bloody, black, or tarry stools. Have a very bad sore throat or you cannot swallow. Have difficulty breathing or very bad pain in your chest or abdomen. These symptoms may be an emergency. Get help right away. Call 911. Do not wait to see if the symptoms will go away. Do not drive yourself to the hospital. Summary After the procedure, it is common to have a sore throat, mild stomach discomfort, bloating, and nausea. If you were given a  sedative during the procedure, it can affect you for several hours. Do not drive until your health care provider says that it is safe. Follow instructions from your health care provider about what you may eat and drink. Return to your normal activities as told by your health care provider. This information is not intended to replace advice given to you by your health care provider. Make sure you discuss any questions you have with your health care provider. Document Revised: 01/12/2022 Document Reviewed: 01/12/2022 Elsevier Patient Education  2024 [employer]   Monitored Anesthesia Care, Care After The following information offers guidance on how to care for yourself after your procedure. Your health care provider may also give you more specific instructions. If you have problems or questions, contact your health care provider. What can I expect after the procedure? After the procedure, it is common to have: Tiredness. Little or no memory about what happened during or after the procedure. Impaired judgment when it comes to making decisions. Nausea or vomiting. Some trouble with balance. Follow these instructions at home: For the time period you were told by your health care provider:  Rest. Do not participate in activities where you could fall or become injured. Do not drive or use machinery. Do not drink alcohol. Do not take sleeping pills or medicines that cause drowsiness. Do not make important decisions or sign legal documents. Do not take care of children on your own. Medicines Take over-the-counter and prescription medicines only as told by your health care provider. If you were prescribed antibiotics, take them as told by your health care provider. Do not stop using the antibiotic even if you start to feel better. Eating and drinking Follow instructions from your health care provider about what you may eat and drink. Drink enough fluid to keep your urine pale yellow. If you  vomit: Drink clear fluids slowly and in small amounts as you are able. Clear fluids include water, ice chips, low-calorie sports drinks, and fruit juice that has water added to it (diluted fruit juice). Eat light and bland foods in small amounts as you are able. These foods include bananas, applesauce, rice, lean meats, toast, and crackers. General instructions  Have a responsible adult stay with you for the time you are told. It is important to have someone help care for you until you are awake and alert. If you have sleep apnea, surgery and some medicines can increase your risk for breathing problems. Follow instructions from your health care provider about wearing your sleep device: When you are sleeping. This includes during daytime naps. While taking prescription pain medicines, sleeping medicines, or medicines that make you drowsy. Do not use any products that contain nicotine or tobacco. These products include cigarettes, chewing tobacco, and vaping devices, such as e-cigarettes. If you need help quitting, ask your health care provider. Contact a health care provider if: You feel nauseous or vomit every time you eat or drink. You feel light-headed. You are still  sleepy or having trouble with balance after 24 hours. You get a rash. You have a fever. You have redness or swelling around the IV site. Get help right away if: You have trouble breathing. You have new confusion after you get home. These symptoms may be an emergency. Get help right away. Call 911. Do not wait to see if the symptoms will go away. Do not drive yourself to the hospital. This information is not intended to replace advice given to you by your health care provider. Make sure you discuss any questions you have with your health care provider. Document Revised: 02/28/2022 Document Reviewed: 02/28/2022 Elsevier Patient Education  2024 [employer].

## 2024-01-30 ENCOUNTER — Ambulatory Visit: Payer: Medicaid Other

## 2024-01-30 ENCOUNTER — Encounter: Attending: Surgery | Admitting: *Deleted

## 2024-01-30 ENCOUNTER — Other Ambulatory Visit: Payer: Self-pay | Admitting: *Deleted

## 2024-01-30 VITALS — BP 131/96 | HR 96 | Temp 98.3°F | Resp 18

## 2024-01-30 DIAGNOSIS — G43019 Migraine without aura, intractable, without status migrainosus: Secondary | ICD-10-CM | POA: Diagnosis present

## 2024-01-30 DIAGNOSIS — N181 Chronic kidney disease, stage 1: Secondary | ICD-10-CM

## 2024-01-30 MED ORDER — SODIUM CHLORIDE 0.9 % IV SOLN
300.0000 mg | Freq: Once | INTRAVENOUS | Status: AC
  Administered 2024-01-30: 300 mg via INTRAVENOUS
  Filled 2024-01-30: qty 3

## 2024-01-30 NOTE — Progress Notes (Signed)
 Diagnosis: migraines  Provider:  Samantha Cress MD  Procedure: IV Infusion  IV Type: Peripheral, IV Location: R Hand  Vyepti (Eptinezumab-jjmr), Dose: 300 mg  Infusion Start Time: 1503  Infusion Stop Time: 1534  Post Infusion IV Care: Observation period completed  Discharge: Condition: Good, Destination: Home . AVS Provided  Performed by:  Maddyn Lieurance Ragsdale, RN

## 2024-01-31 ENCOUNTER — Ambulatory Visit (HOSPITAL_COMMUNITY)
Admission: RE | Admit: 2024-01-31 | Discharge: 2024-01-31 | Disposition: A | Discharge status: Home / Self Care | Source: Ambulatory Visit | Attending: Gastroenterology | Admitting: Gastroenterology

## 2024-01-31 ENCOUNTER — Encounter (HOSPITAL_COMMUNITY): Payer: Self-pay

## 2024-01-31 ENCOUNTER — Encounter (HOSPITAL_COMMUNITY)
Admission: RE | Admit: 2024-01-31 | Discharge: 2024-01-31 | Disposition: A | Discharge status: Home / Self Care | Source: Ambulatory Visit | Attending: Internal Medicine | Admitting: Internal Medicine

## 2024-01-31 VITALS — BP 131/88 | HR 96 | Temp 98.3°F | Resp 18 | Ht 61.0 in | Wt 224.2 lb

## 2024-01-31 DIAGNOSIS — Z79899 Other long term (current) drug therapy: Secondary | ICD-10-CM | POA: Insufficient documentation

## 2024-01-31 DIAGNOSIS — E119 Type 2 diabetes mellitus without complications: Secondary | ICD-10-CM | POA: Diagnosis present

## 2024-01-31 DIAGNOSIS — R1901 Right upper quadrant abdominal swelling, mass and lump: Secondary | ICD-10-CM | POA: Insufficient documentation

## 2024-01-31 LAB — PROTEIN / CREATININE RATIO, URINE
Creatinine, Urine: 17 mg/dL — ABNORMAL LOW (ref 20–275)
Protein/Creat Ratio: 59 mg/g{creat} (ref 24–184)
Protein/Creatinine Ratio: 0.059 mg/mg{creat} (ref 0.024–0.184)
Total Protein, Urine: 1 mg/dL — ABNORMAL LOW (ref 5–24)

## 2024-01-31 LAB — CBC
HCT: 36.7 % (ref 35.0–45.0)
Hemoglobin: 12.2 g/dL (ref 11.7–15.5)
MCH: 28.5 pg (ref 27.0–33.0)
MCHC: 33.2 g/dL (ref 32.0–36.0)
MCV: 85.7 fL (ref 80.0–100.0)
MPV: 10.8 fL (ref 7.5–12.5)
Platelets: 374 10*3/uL (ref 140–400)
RBC: 4.28 10*6/uL (ref 3.80–5.10)
RDW: 11.9 % (ref 11.0–15.0)
WBC: 8.9 10*3/uL (ref 3.8–10.8)

## 2024-01-31 LAB — RENAL FUNCTION PANEL
Albumin: 4.1 g/dL (ref 3.6–5.1)
BUN: 14 mg/dL (ref 7–25)
CO2: 23 mmol/L (ref 20–32)
Calcium: 9.3 mg/dL (ref 8.6–10.2)
Chloride: 104 mmol/L (ref 98–110)
Creat: 0.78 mg/dL (ref 0.50–0.99)
Glucose, Bld: 155 mg/dL — ABNORMAL HIGH (ref 65–99)
Phosphorus: 2.9 mg/dL (ref 2.5–4.5)
Potassium: 4.4 mmol/L (ref 3.5–5.3)
Sodium: 136 mmol/L (ref 135–146)

## 2024-01-31 LAB — BASIC METABOLIC PANEL WITH GFR
Anion gap: 11 (ref 5–15)
BUN: 20 mg/dL (ref 6–20)
CO2: 23 mmol/L (ref 22–32)
Calcium: 9.4 mg/dL (ref 8.9–10.3)
Chloride: 103 mmol/L (ref 98–111)
Creatinine, Ser: 0.83 mg/dL (ref 0.44–1.00)
GFR, Estimated: >60 mL/min (ref >60)
Glucose, Bld: 171 mg/dL — ABNORMAL HIGH (ref 70–99)
Potassium: 4 mmol/L (ref 3.5–5.1)
Sodium: 137 mmol/L (ref 135–145)

## 2024-02-01 ENCOUNTER — Other Ambulatory Visit: Payer: Self-pay | Admitting: *Deleted

## 2024-02-01 DIAGNOSIS — D179 Benign lipomatous neoplasm, unspecified: Secondary | ICD-10-CM

## 2024-02-05 ENCOUNTER — Encounter (HOSPITAL_COMMUNITY)
Admission: RE | Disposition: A | Discharge status: Home / Self Care | Payer: Self-pay | Source: Home / Self Care | Attending: Internal Medicine

## 2024-02-05 ENCOUNTER — Ambulatory Visit (HOSPITAL_COMMUNITY): Payer: Self-pay | Admitting: Certified Registered Nurse Anesthetist

## 2024-02-05 ENCOUNTER — Ambulatory Visit (HOSPITAL_COMMUNITY)
Admission: RE | Admit: 2024-02-05 | Discharge: 2024-02-05 | Disposition: A | Discharge status: Home / Self Care | Attending: Internal Medicine | Admitting: Internal Medicine

## 2024-02-05 ENCOUNTER — Other Ambulatory Visit: Payer: Self-pay

## 2024-02-05 ENCOUNTER — Encounter (HOSPITAL_COMMUNITY): Payer: Self-pay | Admitting: Internal Medicine

## 2024-02-05 DIAGNOSIS — K30 Functional dyspepsia: Secondary | ICD-10-CM | POA: Insufficient documentation

## 2024-02-05 DIAGNOSIS — E119 Type 2 diabetes mellitus without complications: Secondary | ICD-10-CM | POA: Insufficient documentation

## 2024-02-05 DIAGNOSIS — F418 Other specified anxiety disorders: Secondary | ICD-10-CM

## 2024-02-05 DIAGNOSIS — Z79899 Other long term (current) drug therapy: Secondary | ICD-10-CM | POA: Insufficient documentation

## 2024-02-05 DIAGNOSIS — Z9049 Acquired absence of other specified parts of digestive tract: Secondary | ICD-10-CM | POA: Insufficient documentation

## 2024-02-05 DIAGNOSIS — R1310 Dysphagia, unspecified: Secondary | ICD-10-CM | POA: Insufficient documentation

## 2024-02-05 DIAGNOSIS — K317 Polyp of stomach and duodenum: Secondary | ICD-10-CM | POA: Diagnosis not present

## 2024-02-05 DIAGNOSIS — K222 Esophageal obstruction: Secondary | ICD-10-CM | POA: Diagnosis not present

## 2024-02-05 DIAGNOSIS — Z6841 Body Mass Index (BMI) 40.0 and over, adult: Secondary | ICD-10-CM | POA: Diagnosis not present

## 2024-02-05 DIAGNOSIS — K3189 Other diseases of stomach and duodenum: Secondary | ICD-10-CM

## 2024-02-05 DIAGNOSIS — Z7985 Long-term (current) use of injectable non-insulin antidiabetic drugs: Secondary | ICD-10-CM | POA: Insufficient documentation

## 2024-02-05 DIAGNOSIS — E66813 Obesity, class 3: Secondary | ICD-10-CM | POA: Diagnosis not present

## 2024-02-05 DIAGNOSIS — I10 Essential (primary) hypertension: Secondary | ICD-10-CM | POA: Insufficient documentation

## 2024-02-05 DIAGNOSIS — K2970 Gastritis, unspecified, without bleeding: Secondary | ICD-10-CM | POA: Insufficient documentation

## 2024-02-05 HISTORY — PX: ESOPHAGOGASTRODUODENOSCOPY: SHX5428

## 2024-02-05 HISTORY — PX: ESOPHAGEAL DILATION: SHX303

## 2024-02-05 LAB — GLUCOSE, CAPILLARY: Glucose-Capillary: 190 mg/dL — ABNORMAL HIGH (ref 70–99)

## 2024-02-05 SURGERY — EGD (ESOPHAGOGASTRODUODENOSCOPY)
Anesthesia: General

## 2024-02-05 MED ORDER — LACTATED RINGERS IV SOLN
INTRAVENOUS | Status: DC | PRN
Start: 2024-02-05 — End: 2024-02-05

## 2024-02-05 MED ORDER — PROPOFOL 10 MG/ML IV BOLUS
INTRAVENOUS | Status: DC | PRN
  Administered 2024-02-05: 50 mg via INTRAVENOUS
  Administered 2024-02-05: 20 mg via INTRAVENOUS
  Administered 2024-02-05: 100 mg via INTRAVENOUS
  Administered 2024-02-05: 50 mg via INTRAVENOUS
  Administered 2024-02-05 (×2): 20 mg via INTRAVENOUS

## 2024-02-05 MED ORDER — LACTATED RINGERS IV SOLN: INTRAVENOUS | Status: DC

## 2024-02-05 NOTE — Op Note (Signed)
 [facility] Patient Name: Cindy Stevenson Procedure Date: 02/05/2024 9:38 AM MRN: [id number] Date of Birth: [birth year] Attending MD: Rolando Cliche. Mordechai April , [place], [id number] CSN: [id number] Age: 48 Admit Type: Outpatient Procedure:                Upper GI endoscopy Indications:              Functional Dyspepsia, Dysphagia Providers:                Rolando Cliche. Mordechai April, DO, Graydon Lazier RN, RN, Sharlette Dayhoff Technician, Technician Referring MD:              Medicines:                See the Anesthesia note for documentation of the                            administered medications Complications:            No immediate complications. Estimated Blood Loss:     Estimated blood loss was minimal. Procedure:                Pre-Anesthesia Assessment:                           - The anesthesia plan was to use monitored                            anesthesia care (MAC).                           After obtaining informed consent, the endoscope was                            passed under direct vision. Throughout the                            procedure, the patient's blood pressure, pulse, and                            oxygen saturations were monitored continuously. The                            GIF-H190 ([id number]) scope was introduced through the                            mouth, and advanced to the second part of duodenum.                            The upper GI endoscopy was accomplished without                            difficulty. The patient tolerated the procedure                            well. Scope  In: 10:01:34 AM Scope Out: 10:09:20 AM Total Procedure Duration: 0 hours 7 minutes 46 seconds  Findings:      A mild Schatzki ring was found in the lower third of the esophagus. A       TTS dilator was passed through the scope. Dilation with an 18-19-20 mm       balloon dilator was performed to 20 mm. The dilation site was examined       and showed moderate  improvement in luminal narrowing.      Patchy mild inflammation characterized by erythema was found in the       gastric body. Biopsies were taken with a cold forceps for Helicobacter       pylori testing.      Four 5 to 7 mm sessile polyps with no bleeding and no stigmata of recent       bleeding were found in the gastric antrum. The polyp was removed with a       cold snare. Resection and retrieval were complete.      The duodenal bulb, first portion of the duodenum and second portion of       the duodenum were normal. Impression:               - Mild Schatzki ring. Dilated.                           - Gastritis. Biopsied.                           - Four gastric polyps. Resected and retrieved.                           - Normal duodenal bulb, first portion of the                            duodenum and second portion of the duodenum. Moderate Sedation:      Per Anesthesia Care Recommendation:           - Patient has a contact number available for                            emergencies. The signs and symptoms of potential                            delayed complications were discussed with the                            patient. Return to normal activities tomorrow.                            Written discharge instructions were provided to the                            patient.                           - Resume previous diet.                           -  Continue present medications.                           - Await pathology results.                           - Repeat upper endoscopy PRN for retreatment.                           - Return to GI clinic in 8 weeks. Procedure Code(s):        --- Professional ---                           [phone], Esophagogastroduodenoscopy, flexible,                            transoral; with removal of tumor(s), polyp(s), or                            other lesion(s) by snare technique                           43249, Esophagogastroduodenoscopy,  flexible,                            transoral; with transendoscopic balloon dilation of                            esophagus (less than 30 mm diameter)                           43239, 59, Esophagogastroduodenoscopy, flexible,                            transoral; with biopsy, single or multiple Diagnosis Code(s):        --- Professional ---                           K22.2, Esophageal obstruction                           K29.70, Gastritis, unspecified, without bleeding                           K31.7, Polyp of stomach and duodenum                           K30, Functional dyspepsia                           R13.10, Dysphagia, unspecified CPT copyright 2022 American Medical Association. All rights reserved. The codes documented in this report are preliminary and upon coder review may  be revised to meet current compliance requirements. Rolando Cliche. Mordechai April, DO Rolando Cliche. Bailey Kolbe, DO 02/05/2024 10:14:00 AM This report has been signed electronically. Number of Addenda: 0

## 2024-02-05 NOTE — Discharge Instructions (Addendum)
 EGD Discharge instructions Please read the instructions outlined below and refer to this sheet in the next few weeks. These discharge instructions provide you with general information on caring for yourself after you leave the hospital. Your doctor may also give you specific instructions. While your treatment has been planned according to the most current medical practices available, unavoidable complications occasionally occur. If you have any problems or questions after discharge, please call your doctor. ACTIVITY You may resume your regular activity but move at a slower pace for the next 24 hours.  Take frequent rest periods for the next 24 hours.  Walking will help expel (get rid of) the air and reduce the bloated feeling in your abdomen.  No driving for 24 hours (because of the anesthesia (medicine) used during the test).  You may shower.  Do not sign any important legal documents or operate any machinery for 24 hours (because of the anesthesia used during the test).  NUTRITION Drink plenty of fluids.  You may resume your normal diet.  Begin with a light meal and progress to your normal diet.  Avoid alcoholic beverages for 24 hours or as instructed by your caregiver.  MEDICATIONS You may resume your normal medications unless your caregiver tells you otherwise.  WHAT YOU CAN EXPECT TODAY You may experience abdominal discomfort such as a feeling of fullness or "gas" pains.  FOLLOW-UP Your doctor will discuss the results of your test with you.  SEEK IMMEDIATE MEDICAL ATTENTION IF ANY OF THE FOLLOWING OCCUR: Excessive nausea (feeling sick to your stomach) and/or vomiting.  Severe abdominal pain and distention (swelling).  Trouble swallowing.  Temperature over 101 F (37.8 C).  Rectal bleeding or vomiting of blood.   Your EGD revealed mild amount inflammation in your stomach.  I took biopsies of this to rule out infection with a bacteria called H. pylori.  You also had 4 small polyps in  your stomach which I removed today.  Await pathology results, my office will contact you.  Mild tightening of your esophagus again noted.  I stretched this out today.  Small bowel appeared normal.  Continue on esomeprazole.  Follow-up in GI office in 8 weeks.  I hope you have a great rest of your week!  Rolando Cliche. Mordechai April, D.O. Gastroenterology and Hepatology [facility]

## 2024-02-05 NOTE — Anesthesia Preprocedure Evaluation (Signed)
 Anesthesia Evaluation  Patient identified by MRN, date of birth, ID band Patient awake    Reviewed: Allergy & Precautions, H&P , NPO status , Patient's Chart, lab work & pertinent test results, reviewed documented beta blocker date and time   Airway Mallampati: II  TM Distance: >3 FB Neck ROM: full    Dental no notable dental hx.    Pulmonary neg pulmonary ROS   Pulmonary exam normal breath sounds clear to auscultation       Cardiovascular Exercise Tolerance: Good hypertension, negative cardio ROS  Rhythm:regular Rate:Normal     Neuro/Psych  Headaches PSYCHIATRIC DISORDERS Anxiety Depression    negative neurological ROS  negative psych ROS   GI/Hepatic negative GI ROS, Neg liver ROS,GERD  ,,  Endo/Other  diabetes  Class 3 obesity  Renal/GU negative Renal ROS  negative genitourinary   Musculoskeletal   Abdominal   Peds  Hematology negative hematology ROS (+)   Anesthesia Other Findings   Reproductive/Obstetrics negative OB ROS                             Anesthesia Physical Anesthesia Plan  ASA: 3  Anesthesia Plan: General   Post-op Pain Management:    Induction:   PONV Risk Score and Plan: Propofol  infusion  Airway Management Planned:   Additional Equipment:   Intra-op Plan:   Post-operative Plan:   Informed Consent: I have reviewed the patients History and Physical, chart, labs and discussed the procedure including the risks, benefits and alternatives for the proposed anesthesia with the patient or authorized representative who has indicated his/her understanding and acceptance.     Dental Advisory Given  Plan Discussed with: CRNA  Anesthesia Plan Comments:        Anesthesia Quick Evaluation

## 2024-02-05 NOTE — Anesthesia Postprocedure Evaluation (Signed)
 Anesthesia Post Note  Patient: Cindy Stevenson  Procedure(s) Performed: EGD (ESOPHAGOGASTRODUODENOSCOPY) DILATION, ESOPHAGUS  Patient location during evaluation: Short Stay Anesthesia Type: General Level of consciousness: awake and alert Pain management: pain level controlled Vital Signs Assessment: post-procedure vital signs reviewed and stable Respiratory status: spontaneous breathing Cardiovascular status: blood pressure returned to baseline and stable Postop Assessment: no apparent nausea or vomiting Anesthetic complications: no   No notable events documented.   Last Vitals:  Vitals:   02/05/24 0753  BP: (!) 128/91  Pulse: 98  Resp: 18  Temp: 36.7 C  SpO2: 98%    Last Pain:  Vitals:   02/05/24 0956  TempSrc:   PainSc: 0-No pain                 Alishea Beaudin

## 2024-02-05 NOTE — Transfer of Care (Signed)
 Immediate Anesthesia Transfer of Care Note  Patient: Cindy Stevenson  Procedure(s) Performed: EGD (ESOPHAGOGASTRODUODENOSCOPY) DILATION, ESOPHAGUS  Patient Location: Short Stay  Anesthesia Type:General  Level of Consciousness: awake  Airway & Oxygen Therapy: Patient Spontanous Breathing  Post-op Assessment: Report given to RN  Post vital signs: Reviewed and stable  Last Vitals:  Vitals Value Taken Time  BP    Temp    Pulse    Resp    SpO2      Last Pain:  Vitals:   02/05/24 0956  TempSrc:   PainSc: 0-No pain         Complications: No notable events documented.

## 2024-02-05 NOTE — Interval H&P Note (Signed)
 History and Physical Interval Note:  02/05/2024 8:51 AM  Cindy Stevenson  has presented today for surgery, with the diagnosis of dysphagia,dyspepsia.  The various methods of treatment have been discussed with the patient and family. After consideration of risks, benefits and other options for treatment, the patient has consented to  Procedure(s) with comments: EGD (ESOPHAGOGASTRODUODENOSCOPY) (N/A) - 9:15 am, asa 3 DILATION, ESOPHAGUS (N/A) - 9:15 am, asa 3 as a surgical intervention.  The patient's history has been reviewed, patient examined, no change in status, stable for surgery.  I have reviewed the patient's chart and labs.  Questions were answered to the patient's satisfaction.     [name]

## 2024-02-06 ENCOUNTER — Encounter (HOSPITAL_COMMUNITY): Payer: Self-pay | Admitting: Internal Medicine

## 2024-02-06 LAB — SURGICAL PATHOLOGY

## 2024-02-15 ENCOUNTER — Encounter: Admitting: Physical Medicine and Rehabilitation

## 2024-02-20 ENCOUNTER — Ambulatory Visit: Admitting: Physical Medicine and Rehabilitation

## 2024-02-20 ENCOUNTER — Other Ambulatory Visit: Payer: Self-pay

## 2024-02-20 VITALS — BP 129/86 | HR 89

## 2024-02-20 DIAGNOSIS — M47812 Spondylosis without myelopathy or radiculopathy, cervical region: Secondary | ICD-10-CM

## 2024-02-20 MED ORDER — METHYLPREDNISOLONE ACETATE 40 MG/ML IJ SUSP
40.0000 mg | Freq: Once | INTRAMUSCULAR | Status: AC
  Administered 2024-02-20: 40 mg

## 2024-02-20 NOTE — Patient Instructions (Signed)

## 2024-02-20 NOTE — Progress Notes (Signed)
 Pain Scale   Average Pain 6 Patient advising she has chronic neck pain without any relief. Patient has 2nd RFA on May 12        +Driver, -BT, -Dye Allergies.

## 2024-02-21 NOTE — Therapy (Deleted)
 OUTPATIENT PHYSICAL THERAPY THORACOLUMBAR EVALUATION   Patient Name: Cindy Stevenson MRN: [id number] DOB:[birth year], 48 y.o., female Today's Date: 02/21/2024  END OF SESSION:   Past Medical History:  Diagnosis Date   Anxiety    Breast nodule 08/06/2015   Breast pain, left 08/06/2015   Burning with urination 02/27/2014   Common migraine with intractable migraine 06/11/2018   Depression    Diabetes mellitus without complication (HCC)    Dysmenorrhea 02/27/2014   Fibroadenoma of right breast 08/26/2015   GERD (gastroesophageal reflux disease)    Headache    Hematuria 02/27/2014   Hypertension    PTSD (post-traumatic stress disorder)    Screening for STD (sexually transmitted disease) 03/27/2014   Seasonal allergies    SUI (stress urinary incontinence, female) 02/27/2014   Vaginal discharge 08/06/2015   Warts, genital 01/23/2015   Yeast infection 08/06/2015   Past Surgical History:  Procedure Laterality Date   BALLOON DILATION N/A 03/24/2022   Procedure: BALLOON DILATION;  Surgeon: [name], DO;  [facility];  Service: Endoscopy;  Laterality: N/A;   BILATERAL SALPINGECTOMY Bilateral 05/09/2017   Procedure: BILATERAL SALPINGECTOMY;  Surgeon: Albino Hum, MD;  Location: [facility];  Service: Gynecology;  Laterality: Bilateral;   CHOLECYSTECTOMY  03/2023   COLONOSCOPY N/A 09/10/2013   YSA:YTKZS;/WFUXN internal hemorrhoids   COLONOSCOPY WITH PROPOFOL  N/A 03/24/2022   Surgeon: [name], DO;   Hemorrhoids on perianal exam, internal hemorrhoids, otherwise normal exam.  Repeat in 10 years.   ESOPHAGEAL DILATION N/A 02/05/2024   Procedure: DILATION, ESOPHAGUS;  Surgeon: [name], DO;  Location: [facility];  Service: Endoscopy;  Laterality: N/A;  9:15 am, asa 3   ESOPHAGOGASTRODUODENOSCOPY N/A 09/10/2013   ATF:TDDUKGUR ring at the gastroesophagral juctions/mild non-erosive gastritis   ESOPHAGOGASTRODUODENOSCOPY  2017   Dr. Alline Ivans; Schatzki's ring in  the distal esophagus, otherwise normal exam.   ESOPHAGOGASTRODUODENOSCOPY N/A 02/05/2024   Procedure: EGD (ESOPHAGOGASTRODUODENOSCOPY);  Surgeon: [name], DO;  [facility]: [facility];  Service: Endoscopy;  Laterality: N/A;  9:15 am, asa 3   ESOPHAGOGASTRODUODENOSCOPY (EGD) WITH PROPOFOL  N/A 03/24/2022   Surgeon: [name], DO;   Mild Schatzki's ring dilated, 2 gastric polyps removed, otherwise normal exam. Polyps were hyperplastic.   LABIOPLASTY  02/14/2012   Procedure: LABIAPLASTY;  Surgeon: Albino Hum, MD;  Location: [facility];  Service: Gynecology;  Laterality: Right;  of the right labia minora   POLYPECTOMY  03/24/2022   Procedure: POLYPECTOMY;  Surgeon: [name], DO;  [facility]: [facility];  Service: Endoscopy;;   SUPRACERVICAL ABDOMINAL HYSTERECTOMY N/A 05/09/2017   Procedure: HYSTERECTOMY SUPRACERVICAL ABDOMINAL;  Surgeon: Albino Hum, MD;  Location: [facility];  Service: Gynecology;  Laterality: N/A;   WISDOM TOOTH EXTRACTION  10/18/1995   Dr. Bevin Bucks   Patient Active Problem List   Diagnosis Date Noted   Abdominal wall mass of right upper quadrant 01/18/2024   Dysphagia 01/18/2024   Dyspepsia 01/18/2024   Biliary colic 03/31/2023   Prolapsed internal hemorrhoids, grade 3 12/01/2022   Prolapsed internal hemorrhoids, grade 2 09/29/2022   HLD (hyperlipidemia) 08/18/2022   Constipation 07/18/2022   Abdominal pain, epigastric 07/18/2022   LLQ abdominal pain 07/18/2022   Hemorrhoids 03/11/2022   Vaginal irritation 12/09/2020   Screening for cardiovascular condition 12/09/2020   Anxiety and depression 12/09/2020   Mixed stress and urge urinary incontinence 12/09/2020   Low back pain 08/24/2020   Routine medical exam 05/14/2020   Encounter for screening  fecal occult blood testing 05/14/2020   Current use of estrogen therapy 05/14/2020   Hot flashes 04/30/2020   Moody 04/30/2020   Common migraine with intractable migraine 06/11/2018   Status post  abdominal supracervical subtotal hysterectomy 05/09/2017   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   Abnormal uterine bleeding (AUB) 03/09/2017   Depression 03/09/2017   Pelvic pain 03/09/2017   Irritable bowel syndrome 05/09/2016   GERD (gastroesophageal reflux disease) 05/09/2016   Fibroadenoma of right breast 08/26/2015   Vaginal discharge 08/06/2015   Yeast infection 08/06/2015   Breast nodule 08/06/2015   Breast pain, left 08/06/2015   Postop check 03/25/2015   Warts, genital 01/23/2015   Screening for STD (sexually transmitted disease) 03/27/2014   Burning with urination 02/27/2014   Hematuria 02/27/2014   SUI (stress urinary incontinence, female) 02/27/2014   Dysmenorrhea 02/27/2014   Generalized abdominal pain 09/06/2013   Rectal bleeding 09/06/2013    PCP: Arvis Bison  REFERRING PROVIDER: Darryll Eng, NP  REFERRING DIAG: G89.29,M54.41 (ICD-10-CM) - Chronic bilateral low back pain with right-sided sciatica  Rationale for Evaluation and Treatment: Rehabilitation  THERAPY DIAG:  No diagnosis found.  ONSET DATE: ***  SUBJECTIVE:                                                                                                                                                                                           SUBJECTIVE STATEMENT: ***  PERTINENT HISTORY:  ***  PAIN:  Are you having pain? {OPRCPAIN:27236}  PRECAUTIONS: None  RED FLAGS: {PT Red Flags:29287}   WEIGHT BEARING RESTRICTIONS: No  FALLS:  Has patient fallen in last 6 months? {fallsyesno:27318}  LIVING ENVIRONMENT: Lives with: {OPRC lives with:25569::"lives with their family"} Lives in: {Lives in:25570} Stairs: {opstairs:27293} Has following equipment at home: {Assistive devices:23999}  OCCUPATION: ***  PLOF: {PLOF:24004}  PATIENT GOALS: ***  NEXT MD VISIT: ***  OBJECTIVE:  Note: Objective measures were completed at Evaluation unless otherwise  noted.  DIAGNOSTIC FINDINGS:  ***  PATIENT SURVEYS:  Modified Oswestry ***   COGNITION: Overall cognitive status: {cognition:24006}     SENSATION: {sensation:27233}  MUSCLE LENGTH: Hamstrings: Right *** deg; Left *** deg Andy Bannister test: Right *** deg; Left *** deg  POSTURE: {posture:25561}  PALPATION: ***  LUMBAR ROM:   AROM eval  Flexion   Extension   Right lateral flexion   Left lateral flexion   Right rotation   Left rotation    (Blank rows = not tested)  LOWER EXTREMITY ROM:     {AROM/PROM:27142}  Right eval Left eval  Hip flexion    Hip extension    Hip abduction  Hip adduction    Hip internal rotation    Hip external rotation    Knee flexion    Knee extension    Ankle dorsiflexion    Ankle plantarflexion    Ankle inversion    Ankle eversion     (Blank rows = not tested)  LOWER EXTREMITY MMT:    MMT Right eval Left eval  Hip flexion    Hip extension    Hip abduction    Hip adduction    Hip internal rotation    Hip external rotation    Knee flexion    Knee extension    Ankle dorsiflexion    Ankle plantarflexion    Ankle inversion    Ankle eversion     (Blank rows = not tested)  LUMBAR SPECIAL TESTS:  {lumbar special test:25242}  FUNCTIONAL TESTS:  {Functional tests:24029}  GAIT: Distance walked: *** Assistive device utilized: {Assistive devices:23999} Level of assistance: {Levels of assistance:24026} Comments: ***  TREATMENT DATE:  02/22/24: PT Eval and HEP                                                                                                                                 PATIENT EDUCATION:  Education details: PT evaluation, objective findings, POC, Importance of HEP, Precautions, Clinic policies Person educated: Patient Education method: Explanation and Demonstration Education comprehension: verbalized understanding and returned demonstration  HOME EXERCISE PROGRAM: ***  ASSESSMENT:  CLINICAL  IMPRESSION: Patient is a 48 y.o. female who was seen today for physical therapy evaluation and treatment for G89.29,M54.41 (ICD-10-CM) - Chronic bilateral low back pain with right-sided sciatica.   OBJECTIVE IMPAIRMENTS: {opptimpairments:25111}.   ACTIVITY LIMITATIONS: {activitylimitations:27494}  PARTICIPATION LIMITATIONS: {participationrestrictions:25113}  PERSONAL FACTORS: {Personal factors:25162} are also affecting patient's functional outcome.   REHAB POTENTIAL: {rehabpotential:25112}  CLINICAL DECISION MAKING: {clinical decision making:25114}  EVALUATION COMPLEXITY: {Evaluation complexity:25115}   GOALS: Goals reviewed with patient? No  SHORT TERM GOALS: Target date: 03/07/24 Patient will be independent with performance of HEP to demonstrate adequate self management of symptoms.  Baseline:  Goal status: INITIAL  2.   Patient will report at least a 25% improvement with function or pain overall since beginning PT. Baseline:  Goal status: INITIAL   LONG TERM GOALS: Target date: 04/04/24 Patient will improve Oswestry score by     points in order to improve self-perceived disability and overall function.  Baseline: Goal status: INITIAL   2.  Patient will improve     score by     in order to   .  Baseline: Goal status: INITIAL   3.  Patient will improve    test to   in order to improve LE strength and endurance to return to  . Baseline:  Goal status: INITIAL   4.  Patient will gain at least    deg of AROM in     in order to improve foot clearance for safe gait mechanics Baseline:  Goal status: INITIAL   5.  Patient will report overall 50% improvement since beginning PT. Baseline:  Goal status: INITIAL   PLAN:  PT FREQUENCY: 1-2x/week  PT DURATION: 6 weeks  PLANNED INTERVENTIONS: 97164- PT Re-evaluation, 97110-Therapeutic exercises, 97530- Therapeutic activity, [id number]- Neuromuscular re-education, 97535- Self Care, 16109- Manual therapy, [id number]- Gait training,  [phone]- Electrical stimulation (manual), [id number]- Traction (mechanical), Patient/Family education, Balance training, Stair training, Spinal mobilization, and Moist heat.  PLAN FOR NEXT SESSION: ***   Ardath Koh Powell-Butler, PT 02/21/2024, 2:39 PM    Managed Medicaid Authorization Request  Visit Dx Codes: ***  Functional Tool Score: ***  For all possible CPT codes, reference the Planned Interventions line above.     Check all conditions that are expected to impact treatment: {Conditions expected to impact treatment:{Conditions expected to impact treatment:28273}   If treatment provided at initial evaluation, no treatment charged due to lack of authorization.

## 2024-02-22 ENCOUNTER — Ambulatory Visit (INDEPENDENT_AMBULATORY_CARE_PROVIDER_SITE_OTHER): Admitting: Surgery

## 2024-02-22 ENCOUNTER — Encounter: Payer: Self-pay | Admitting: Surgery

## 2024-02-22 ENCOUNTER — Ambulatory Visit (HOSPITAL_COMMUNITY)

## 2024-02-22 VITALS — BP 155/105 | HR 90 | Temp 98.4°F | Resp 16 | Ht 61.0 in | Wt 226.0 lb

## 2024-02-22 DIAGNOSIS — R1901 Right upper quadrant abdominal swelling, mass and lump: Secondary | ICD-10-CM | POA: Diagnosis not present

## 2024-02-23 NOTE — Progress Notes (Unsigned)
 [facility] History and Physical  Reason for Referral: Abdominal wall lipoma Referring Physician: Karna Pacas, NP  Chief Complaint   Follow-up     Cindy Stevenson is a 48 y.o. female.  HPI: Patient presents for evaluation of a right-sided abdominal lipoma.  She states that she has been having an achy pain on her right side at the location of this bump.  She is unable to sleep on that side secondary to the pain.  She noticed these issues around the same time that she presented with gallbladder issues.  She has had lumps under her breasts in the past which have been described as fatty tissue on imaging.  She has a past medical history significant for diabetes, hypertension, depression, PTSD, and migraines.  She denies use of blood thinning medications.  Her surgical history is significant for an open partial hysterectomy and a robotic assisted laparoscopic cholecystectomy on 03/31/2023.  She denies use of tobacco products, alcohol, and illicit drugs.  Past Medical History:  Diagnosis Date   Anxiety    Breast nodule 08/06/2015   Breast pain, left 08/06/2015   Burning with urination 02/27/2014   Common migraine with intractable migraine 06/11/2018   Depression    Diabetes mellitus without complication (HCC)    Dysmenorrhea 02/27/2014   Fibroadenoma of right breast 08/26/2015   GERD (gastroesophageal reflux disease)    Headache    Hematuria 02/27/2014   Hypertension    PTSD (post-traumatic stress disorder)    Screening for STD (sexually transmitted disease) 03/27/2014   Seasonal allergies    SUI (stress urinary incontinence, female) 02/27/2014   Vaginal discharge 08/06/2015   Warts, genital 01/23/2015   Yeast infection 08/06/2015    Past Surgical History:  Procedure Laterality Date   BALLOON DILATION N/A 03/24/2022   Procedure: BALLOON DILATION;  Surgeon: [name], DO;  [facility]: [facility];  Service: Endoscopy;  Laterality: N/A;   BILATERAL SALPINGECTOMY  Bilateral 05/09/2017   Procedure: BILATERAL SALPINGECTOMY;  Surgeon: Albino Hum, MD;  Location: [facility];  Service: Gynecology;  Laterality: Bilateral;   CHOLECYSTECTOMY  03/2023   COLONOSCOPY N/A 09/10/2013   ZOX:WRUEA;/VWUJW internal hemorrhoids   COLONOSCOPY WITH PROPOFOL  N/A 03/24/2022   Surgeon: [name], DO;   Hemorrhoids on perianal exam, internal hemorrhoids, otherwise normal exam.  Repeat in 10 years.   ESOPHAGEAL DILATION N/A 02/05/2024   Procedure: DILATION, ESOPHAGUS;  Surgeon: [name], DO;  Location: [facility];  Service: Endoscopy;  Laterality: N/A;  9:15 am, asa 3   ESOPHAGOGASTRODUODENOSCOPY N/A 09/10/2013   JXB:JYNWGNFA ring at the gastroesophagral juctions/mild non-erosive gastritis   ESOPHAGOGASTRODUODENOSCOPY  2017   Dr. Alline Ivans; Schatzki's ring in the distal esophagus, otherwise normal exam.   ESOPHAGOGASTRODUODENOSCOPY N/A 02/05/2024   Procedure: EGD (ESOPHAGOGASTRODUODENOSCOPY);  Surgeon: [name], DO;  Location: [facility];  Service: Endoscopy;  Laterality: N/A;  9:15 am, asa 3   ESOPHAGOGASTRODUODENOSCOPY (EGD) WITH PROPOFOL  N/A 03/24/2022   Surgeon: [name], DO;   Mild Schatzki's ring dilated, 2 gastric polyps removed, otherwise normal exam. Polyps were hyperplastic.   LABIOPLASTY  02/14/2012   Procedure: LABIAPLASTY;  Surgeon: Albino Hum, MD;  Location: [facility];  Service: Gynecology;  Laterality: Right;  of the right labia minora   POLYPECTOMY  03/24/2022   Procedure: POLYPECTOMY;  Surgeon: [name], DO;  Location: [facility];  Service: Endoscopy;;   SUPRACERVICAL ABDOMINAL HYSTERECTOMY N/A 05/09/2017   Procedure: HYSTERECTOMY SUPRACERVICAL ABDOMINAL;  Surgeon:  Albino Hum, MD;  Location: [facility];  Service: Gynecology;  Laterality: N/A;   WISDOM TOOTH EXTRACTION  10/18/1995   Dr. Bevin Bucks    Family History  Problem Relation Age of Onset   Lung cancer Mother    Asthma Sister    Allergies Sister     Mental illness Maternal Aunt        depression, PTSD   Diabetes Maternal Grandmother    Cancer Maternal Grandfather        skin   Colon cancer Neg Hx     Social History   Tobacco Use   Smoking status: Never   Smokeless tobacco: Never  Vaping Use   Vaping status: Never Used  Substance Use Topics   Alcohol use: No   Drug use: No    Medications: I have reviewed the patient's current medications. Allergies as of 02/22/2024       Reactions   Effexor  [venlafaxine ] Rash, Hypertension   SSRI syndrome   Latex Dermatitis   Codeine Nausea And Vomiting   Can take with nausea meds   Shellfish Allergy Hives   Sulfa Antibiotics Swelling   Tape    Tears skin   Amoxicillin Rash   Ceftin [cefuroxime] Rash        Medication List        Accurate as of Feb 22, 2024 11:59 PM. If you have any questions, ask your nurse or doctor.          Accu-Chek Guide test strip Generic drug: glucose blood   Accu-Chek Softclix Lancets lancets SMARTSIG:Topical   acetaminophen  500 MG tablet Commonly known as: TYLENOL  Take 500-1,000 mg by mouth every 6 (six) hours as needed (pain.).   albuterol 108 (90 Base) MCG/ACT inhaler Commonly known as: VENTOLIN HFA Inhale 2 puffs into the lungs every 6 (six) hours as needed for shortness of breath or wheezing.   ALPRAZolam 1 MG tablet Commonly known as: XANAX Take 1 mg by mouth 3 (three) times daily.   baclofen  10 MG tablet Commonly known as: LIORESAL  Take 1 tablet (10 mg total) by mouth daily as needed (migraine).   budesonide-formoterol 160-4.5 MCG/ACT inhaler Commonly known as: SYMBICORT Inhale 2 puffs into the lungs 2 (two) times daily.   cetirizine 10 MG tablet Commonly known as: ZYRTEC Take 10 mg by mouth in the morning.   cholecalciferol 25 MCG (1000 UNIT) tablet Commonly known as: VITAMIN D3 Take 1,000 Units by mouth in the morning.   Diclofenac  Potassium(Migraine) 50 MG Pack TAKE 50-100 MG BY MOUTH AS NEEDED FOR MIGRAINE (Max  Daily Dose: 100 MG in 24 HOURS)   docusate sodium  100 MG capsule Commonly known as: COLACE Take 100-200 mg by mouth 2 (two) times daily as needed for mild constipation.   esomeprazole 40 MG capsule Commonly known as: NEXIUM Take 40 mg by mouth 2 (two) times daily.   FISH OIL PO Take 2,000 mg by mouth in the morning.   gabapentin  400 MG capsule Commonly known as: NEURONTIN  Take 400 mg by mouth 4 (four) times daily.   hydrochlorothiazide  25 MG tablet Commonly known as: HYDRODIURIL  Take 25 mg by mouth in the morning.   ibuprofen  600 MG tablet Commonly known as: ADVIL  Take 600 mg by mouth every 8 (eight) hours as needed for headache.   labetalol 100 MG tablet Commonly known as: NORMODYNE Take 50 mg by mouth 2 (two) times daily.   melatonin 3 MG Tabs tablet Take 3-6 mg by mouth at bedtime.  naratriptan  2.5 MG tablet Commonly known as: AMERGE TAKE 1 TABLET BY MOUTH AT ONSET of HEADACHE. if returns OR does not resolve, MAY REPEAT AFTER 4 HOURS (Max Daily Dose: 2 TABLETS in 24 HOURS)   nystatin powder Commonly known as: MYCOSTATIN/NYSTOP Apply 1 Application topically 2 (two) times daily as needed (skin irritation.).   nystatin-triamcinolone  cream Commonly known as: MYCOLOG II Apply 1 Application topically 2 (two) times daily as needed (skin irritation.).   ondansetron  8 MG tablet Commonly known as: ZOFRAN  Take 8 mg by mouth every 8 (eight) hours as needed for nausea.   Pepto-Bismol 262 MG Tabs Generic drug: Bismuth Subsalicylate Take 262 mg by mouth daily as needed (indigestion).   Procto-Med HC  2.5 % rectal cream Generic drug: hydrocortisone  APPLY 1 GRAM RECTALLY TWICE DAILY What changed: See the new instructions.   SYSTANE OP Place 1 drop into both eyes 3 (three) times daily as needed (dry/irritated eyes.).   tiZANidine  2 MG tablet Commonly known as: ZANAFLEX  TAKE 1 TABLET BY MOUTH DAILY AS NEEDED FOR HEADACHE. STOP METHOCARBAMOL    Trulance  3 MG  Tabs Generic drug: Plecanatide  Take 1 tablet (3 mg total) by mouth daily.   Vyepti  100 MG/ML injection Generic drug: Eptinezumab -jjmr Inject 300 mg into the vein every 3 (three) months. Getting at [facility] [phone], fax [phone]         ROS:  Constitutional: negative for chills, fatigue, and fevers Eyes: negative for visual disturbance and pain Ears, nose, mouth, throat, and face: negative for ear drainage, sore throat, and sinus problems Respiratory: negative for cough, wheezing, and shortness of breath Cardiovascular: negative for chest pain and palpitations Gastrointestinal: positive for reflux symptoms, negative for abdominal pain, nausea, and vomiting Genitourinary:negative for dysuria and frequency Integument/breast: negative for dryness and rash Hematologic/lymphatic: negative for bleeding and lymphadenopathy Musculoskeletal:negative for back pain and neck pain Neurological: negative for dizziness and tremors Endocrine: negative for temperature intolerance  Blood pressure (!) 155/105, pulse 90, temperature 98.4 F (36.9 C), temperature source Oral, resp. rate 16, height 5\' 1"  (1.549 m), weight 226 lb (102.5 kg), last menstrual period 04/21/2017, SpO2 97%. Physical Exam Vitals reviewed.  Constitutional:      Appearance: Normal appearance.  HENT:     Head: Normocephalic and atraumatic.  Eyes:     Extraocular Movements: Extraocular movements intact.     Pupils: Pupils are equal, round, and reactive to light.  Cardiovascular:     Rate and Rhythm: Normal rate and regular rhythm.  Pulmonary:     Effort: Pulmonary effort is normal.     Breath sounds: Normal breath sounds.  Abdominal:     Comments: Abdomen soft, nondistended, no percussion tenderness, nontender to palpation; no rigidity, guarding, rebound tenderness; maybe 2 cm palpable mobile mass within the subcutaneous tissues in the right lateral abdomen, in close proximity to a blood spot on her  right lateral abdomen  Musculoskeletal:        General: Normal range of motion.     Cervical back: Normal range of motion.  Skin:    General: Skin is warm and dry.  Neurological:     General: No focal deficit present.     Mental Status: She is alert and oriented to person, place, and time.  Psychiatric:        Mood and Affect: Mood normal.        Behavior: Behavior normal.     Results: Abdominal ultrasound (06/01/2024): IMPRESSION: There is a 24 mm possible lipoma versus prominent fat  lobule at the site of palpable concern.   Clinical follow-up MRI could be considered if there is clinical evidence of interval growth or new pain.  Assessment & Plan:  Cindy Stevenson is a 48 y.o. female who presents for evaluation of a possible lipoma of the right side of her abdomen.  -I had a long discussion with the patient about this possible lipoma versus prominent fat lobule in the right side of her abdomen.  We discussed that while I can remove the area of concern, there is a moderate to high likelihood that she will still have some right sided abdominal pain, as I do not believe this 2 cm lipoma is causing all of her abdominal discomfort on the right side of her abdomen.  We also discussed that given she is not able to reproducibly demonstrate the location of the mass, there is a chance that I may not remove the area of concern -The risk and benefits of right abdominal wall mass excision were discussed including but not limited to bleeding, infection, injury to surrounding structures, need for additional procedures, and persistent pain.  After careful consideration, Cindy Stevenson has decided to proceed with surgery. -Patient tentatively scheduled for surgery on 5/19 -Information provided to the patient regarding lipomas and lipoma excision abdominal wall lipoma  All questions were answered to the satisfaction of the patient.   Note: Portions of this report may have been transcribed using  voice recognition software. Every effort has been made to ensure accuracy; however, inadvertent computerized transcription errors may still be present.   Lidia Reels, DO [facility] [address] [place], [place] [address] [phone] (office)

## 2024-02-26 ENCOUNTER — Encounter: Admitting: Physical Medicine and Rehabilitation

## 2024-02-26 NOTE — Procedures (Signed)
 Cervical Facet Nerve Denervation  Patient: Cindy Stevenson      Date of Birth: 1976/04/27 MRN: [id number] PCP: Lizabeth Riggs, PA-C      Visit Date: 02/20/2024   Universal Protocol:    Date/Time: 05/12/255:37 AM  Consent Given By: the patient  Position: PRONE  Additional Comments: Vital signs were monitored before and after the procedure. Patient was prepped and draped in the usual sterile fashion. The correct patient, procedure, and site was verified.   Injection Procedure Details:   Procedure diagnoses:  1. Cervical spondylosis without myelopathy      Meds Administered:  Meds ordered this encounter  Medications   methylPREDNISolone  acetate (DEPO-MEDROL ) injection 40 mg     Laterality: Bilateral  Location/Site:  C2-3  Needle size: 18 G  Needle type: RF cannula, 5 mm active tip  Findings:  -Comments:   Procedure Details: The fluoroscope beam was positioned to square off the endplates of the desired vertebral level to achieve a true AP position. The beam was then moved in a small "counter" oblique to the contralateral side with a small amount of caudal tilt to achieve a trajectory alignment with the desired nerves.  For each target described below the skin was anesthetized with 1 ml of 1% Lidocaine  without epinephrine .  To denervate the facet joint nerve to C2 (third occipital nerve), the cannula was advanced under fluoroscopic guidance and positioned over the inferior lateral portion of the C2/3 facet joint nerve where the third occipital nerve lies.  A minimum of three lesions were made along the location of the nerve.   For all of these levels, AP and lateral images were used to confirm location.  The radiofrequency probe was inserted into the cannula and stimulation was carried out at both sensory and motor levels to make sure there was expected stimulation without a radicular pattern. Subsequently, this was removed and then 0.5 to 1 ml. of 1% Lidocaine  was  injected. The radiofrequency probe was re-inserted and denervation of the facet nerves (medial branches of the dorsal rami innervating the facet joints) was carried out at 80 degrees Celsius for 90 seconds. The above procedure was repeated for each facet joint nerve mentioned above. Radiographs were obtained at each level (unless otherwise noted) to verify probe placement during the neurotomy.  Additional Comments:  The patient tolerated the procedure well Dressing: Band-Aid    Post-procedure details: Patient was observed during the procedure. Post-procedure instructions were reviewed. Patient left the clinic in stable condition.

## 2024-02-26 NOTE — H&P (Signed)
 [facility] History and Physical  Reason for Referral: Abdominal wall lipoma Referring Physician: Karna Pacas, NP  Chief Complaint   Follow-up     Cindy Stevenson is a 48 y.o. female.  HPI: Patient presents for evaluation of a right-sided abdominal lipoma.  She states that she has been having an achy pain on her right side at the location of this bump.  She is unable to sleep on that side secondary to the pain.  She noticed these issues around the same time that she presented with gallbladder issues.  She has had lumps under her breasts in the past which have been described as fatty tissue on imaging.  She has a past medical history significant for diabetes, hypertension, depression, PTSD, and migraines.  She denies use of blood thinning medications.  Her surgical history is significant for an open partial hysterectomy and a robotic assisted laparoscopic cholecystectomy on 03/31/2023.  She denies use of tobacco products, alcohol, and illicit drugs.  Past Medical History:  Diagnosis Date   Anxiety    Breast nodule 08/06/2015   Breast pain, left 08/06/2015   Burning with urination 02/27/2014   Common migraine with intractable migraine 06/11/2018   Depression    Diabetes mellitus without complication (HCC)    Dysmenorrhea 02/27/2014   Fibroadenoma of right breast 08/26/2015   GERD (gastroesophageal reflux disease)    Headache    Hematuria 02/27/2014   Hypertension    PTSD (post-traumatic stress disorder)    Screening for STD (sexually transmitted disease) 03/27/2014   Seasonal allergies    SUI (stress urinary incontinence, female) 02/27/2014   Vaginal discharge 08/06/2015   Warts, genital 01/23/2015   Yeast infection 08/06/2015    Past Surgical History:  Procedure Laterality Date   BALLOON DILATION N/A 03/24/2022   Procedure: BALLOON DILATION;  Surgeon: [name], DO;  [facility]: [facility];  Service: Endoscopy;  Laterality: N/A;   BILATERAL SALPINGECTOMY  Bilateral 05/09/2017   Procedure: BILATERAL SALPINGECTOMY;  Surgeon: Albino Hum, MD;  Location: [facility];  Service: Gynecology;  Laterality: Bilateral;   CHOLECYSTECTOMY  03/2023   COLONOSCOPY N/A 09/10/2013   ZOX:WRUEA;/VWUJW internal hemorrhoids   COLONOSCOPY WITH PROPOFOL  N/A 03/24/2022   Surgeon: [name], DO;   Hemorrhoids on perianal exam, internal hemorrhoids, otherwise normal exam.  Repeat in 10 years.   ESOPHAGEAL DILATION N/A 02/05/2024   Procedure: DILATION, ESOPHAGUS;  Surgeon: [name], DO;  Location: [facility];  Service: Endoscopy;  Laterality: N/A;  9:15 am, asa 3   ESOPHAGOGASTRODUODENOSCOPY N/A 09/10/2013   JXB:JYNWGNFA ring at the gastroesophagral juctions/mild non-erosive gastritis   ESOPHAGOGASTRODUODENOSCOPY  2017   Dr. Alline Ivans; Schatzki's ring in the distal esophagus, otherwise normal exam.   ESOPHAGOGASTRODUODENOSCOPY N/A 02/05/2024   Procedure: EGD (ESOPHAGOGASTRODUODENOSCOPY);  Surgeon: [name], DO;  Location: [facility];  Service: Endoscopy;  Laterality: N/A;  9:15 am, asa 3   ESOPHAGOGASTRODUODENOSCOPY (EGD) WITH PROPOFOL  N/A 03/24/2022   Surgeon: [name], DO;   Mild Schatzki's ring dilated, 2 gastric polyps removed, otherwise normal exam. Polyps were hyperplastic.   LABIOPLASTY  02/14/2012   Procedure: LABIAPLASTY;  Surgeon: Albino Hum, MD;  Location: [facility];  Service: Gynecology;  Laterality: Right;  of the right labia minora   POLYPECTOMY  03/24/2022   Procedure: POLYPECTOMY;  Surgeon: [name], DO;  [facility]: [facility];  Service: Endoscopy;;   SUPRACERVICAL ABDOMINAL HYSTERECTOMY N/A 05/09/2017   Procedure: HYSTERECTOMY SUPRACERVICAL ABDOMINAL;  Surgeon:  Albino Hum, MD;  Location: [facility];  Service: Gynecology;  Laterality: N/A;   WISDOM TOOTH EXTRACTION  10/18/1995   Dr. Bevin Bucks    Family History  Problem Relation Age of Onset   Lung cancer Mother    Asthma Sister    Allergies Sister     Mental illness Maternal Aunt        depression, PTSD   Diabetes Maternal Grandmother    Cancer Maternal Grandfather        skin   Colon cancer Neg Hx     Social History   Tobacco Use   Smoking status: Never   Smokeless tobacco: Never  Vaping Use   Vaping status: Never Used  Substance Use Topics   Alcohol use: No   Drug use: No    Medications: I have reviewed the patient's current medications. Allergies as of 02/22/2024       Reactions   Effexor  [venlafaxine ] Rash, Hypertension   SSRI syndrome   Latex Dermatitis   Codeine Nausea And Vomiting   Can take with nausea meds   Shellfish Allergy Hives   Sulfa Antibiotics Swelling   Tape    Tears skin   Amoxicillin Rash   Ceftin [cefuroxime] Rash        Medication List        Accurate as of Feb 22, 2024 11:59 PM. If you have any questions, ask your nurse or doctor.          Accu-Chek Guide test strip Generic drug: glucose blood   Accu-Chek Softclix Lancets lancets SMARTSIG:Topical   acetaminophen  500 MG tablet Commonly known as: TYLENOL  Take 500-1,000 mg by mouth every 6 (six) hours as needed (pain.).   albuterol 108 (90 Base) MCG/ACT inhaler Commonly known as: VENTOLIN HFA Inhale 2 puffs into the lungs every 6 (six) hours as needed for shortness of breath or wheezing.   ALPRAZolam 1 MG tablet Commonly known as: XANAX Take 1 mg by mouth 3 (three) times daily.   baclofen  10 MG tablet Commonly known as: LIORESAL  Take 1 tablet (10 mg total) by mouth daily as needed (migraine).   budesonide-formoterol 160-4.5 MCG/ACT inhaler Commonly known as: SYMBICORT Inhale 2 puffs into the lungs 2 (two) times daily.   cetirizine 10 MG tablet Commonly known as: ZYRTEC Take 10 mg by mouth in the morning.   cholecalciferol 25 MCG (1000 UNIT) tablet Commonly known as: VITAMIN D3 Take 1,000 Units by mouth in the morning.   Diclofenac  Potassium(Migraine) 50 MG Pack TAKE 50-100 MG BY MOUTH AS NEEDED FOR MIGRAINE (Max  Daily Dose: 100 MG in 24 HOURS)   docusate sodium  100 MG capsule Commonly known as: COLACE Take 100-200 mg by mouth 2 (two) times daily as needed for mild constipation.   esomeprazole 40 MG capsule Commonly known as: NEXIUM Take 40 mg by mouth 2 (two) times daily.   FISH OIL PO Take 2,000 mg by mouth in the morning.   gabapentin  400 MG capsule Commonly known as: NEURONTIN  Take 400 mg by mouth 4 (four) times daily.   hydrochlorothiazide  25 MG tablet Commonly known as: HYDRODIURIL  Take 25 mg by mouth in the morning.   ibuprofen  600 MG tablet Commonly known as: ADVIL  Take 600 mg by mouth every 8 (eight) hours as needed for headache.   labetalol 100 MG tablet Commonly known as: NORMODYNE Take 50 mg by mouth 2 (two) times daily.   melatonin 3 MG Tabs tablet Take 3-6 mg by mouth at bedtime.  naratriptan  2.5 MG tablet Commonly known as: AMERGE TAKE 1 TABLET BY MOUTH AT ONSET of HEADACHE. if returns OR does not resolve, MAY REPEAT AFTER 4 HOURS (Max Daily Dose: 2 TABLETS in 24 HOURS)   nystatin powder Commonly known as: MYCOSTATIN/NYSTOP Apply 1 Application topically 2 (two) times daily as needed (skin irritation.).   nystatin-triamcinolone  cream Commonly known as: MYCOLOG II Apply 1 Application topically 2 (two) times daily as needed (skin irritation.).   ondansetron  8 MG tablet Commonly known as: ZOFRAN  Take 8 mg by mouth every 8 (eight) hours as needed for nausea.   Pepto-Bismol 262 MG Tabs Generic drug: Bismuth Subsalicylate Take 262 mg by mouth daily as needed (indigestion).   Procto-Med HC  2.5 % rectal cream Generic drug: hydrocortisone  APPLY 1 GRAM RECTALLY TWICE DAILY What changed: See the new instructions.   SYSTANE OP Place 1 drop into both eyes 3 (three) times daily as needed (dry/irritated eyes.).   tiZANidine  2 MG tablet Commonly known as: ZANAFLEX  TAKE 1 TABLET BY MOUTH DAILY AS NEEDED FOR HEADACHE. STOP METHOCARBAMOL    Trulance  3 MG  Tabs Generic drug: Plecanatide  Take 1 tablet (3 mg total) by mouth daily.   Vyepti  100 MG/ML injection Generic drug: Eptinezumab -jjmr Inject 300 mg into the vein every 3 (three) months. Getting at [facility] [phone], fax [phone]         ROS:  Constitutional: negative for chills, fatigue, and fevers Eyes: negative for visual disturbance and pain Ears, nose, mouth, throat, and face: negative for ear drainage, sore throat, and sinus problems Respiratory: negative for cough, wheezing, and shortness of breath Cardiovascular: negative for chest pain and palpitations Gastrointestinal: positive for reflux symptoms, negative for abdominal pain, nausea, and vomiting Genitourinary:negative for dysuria and frequency Integument/breast: negative for dryness and rash Hematologic/lymphatic: negative for bleeding and lymphadenopathy Musculoskeletal:negative for back pain and neck pain Neurological: negative for dizziness and tremors Endocrine: negative for temperature intolerance  Blood pressure (!) 155/105, pulse 90, temperature 98.4 F (36.9 C), temperature source Oral, resp. rate 16, height 5\' 1"  (1.549 m), weight 226 lb (102.5 kg), last menstrual period 04/21/2017, SpO2 97%. Physical Exam Vitals reviewed.  Constitutional:      Appearance: Normal appearance.  HENT:     Head: Normocephalic and atraumatic.  Eyes:     Extraocular Movements: Extraocular movements intact.     Pupils: Pupils are equal, round, and reactive to light.  Cardiovascular:     Rate and Rhythm: Normal rate and regular rhythm.  Pulmonary:     Effort: Pulmonary effort is normal.     Breath sounds: Normal breath sounds.  Abdominal:     Comments: Abdomen soft, nondistended, no percussion tenderness, nontender to palpation; no rigidity, guarding, rebound tenderness; maybe 2 cm palpable mobile mass within the subcutaneous tissues in the right lateral abdomen, in close proximity to a blood spot on her  right lateral abdomen  Musculoskeletal:        General: Normal range of motion.     Cervical back: Normal range of motion.  Skin:    General: Skin is warm and dry.  Neurological:     General: No focal deficit present.     Mental Status: She is alert and oriented to person, place, and time.  Psychiatric:        Mood and Affect: Mood normal.        Behavior: Behavior normal.     Results: Abdominal ultrasound (06/01/2024): IMPRESSION: There is a 24 mm possible lipoma versus prominent fat  lobule at the site of palpable concern.   Clinical follow-up MRI could be considered if there is clinical evidence of interval growth or new pain.  Assessment & Plan:  Cindy Stevenson is a 48 y.o. female who presents for evaluation of a possible lipoma of the right side of her abdomen.  -I had a long discussion with the patient about this possible lipoma versus prominent fat lobule in the right side of her abdomen.  We discussed that while I can remove the area of concern, there is a moderate to high likelihood that she will still have some right sided abdominal pain, as I do not believe this 2 cm lipoma is causing all of her abdominal discomfort on the right side of her abdomen.  We also discussed that given she is not able to reproducibly demonstrate the location of the mass, there is a chance that I may not remove the area of concern -The risk and benefits of right abdominal wall mass excision were discussed including but not limited to bleeding, infection, injury to surrounding structures, need for additional procedures, and persistent pain.  After careful consideration, Cindy Stevenson has decided to proceed with surgery. -Patient tentatively scheduled for surgery on 5/19 -Information provided to the patient regarding lipomas and lipoma excision abdominal wall lipoma  All questions were answered to the satisfaction of the patient.   Note: Portions of this report may have been transcribed using  voice recognition software. Every effort has been made to ensure accuracy; however, inadvertent computerized transcription errors may still be present.   Lidia Reels, DO [facility] [address] [place], [place] [address] [phone] (office)

## 2024-02-26 NOTE — Progress Notes (Signed)
 Cindy Stevenson - 48 y.o. female MRN [id number]  Date of birth: 1976/07/30  Office Visit Note: Visit Date: 02/20/2024 PCP: Lizabeth Riggs, PA-C Referred by: Lizabeth Riggs, *  Subjective: Chief Complaint  Patient presents with   Neck - Pain   HPI:  Cindy Stevenson is a 47 y.o. female who comes in todayfor planned radiofrequency ablation of the Bilateral C2-3 Cervical facet joints. This would be ablation of the corresponding medial branches and/or dorsal rami.  Patient has had double diagnostic blocks with more than 50% relief.  These are documented on pain diary.  They have had chronic back pain for quite some time, more than 3 months, which has been an ongoing situation with recalcitrant axial back pain.  They have no radicular pain.  Their axial pain is worse with standing and ambulating and on exam today with facet loading.  They have had physical therapy as well as home exercise program.  The imaging noted in the chart below indicated facet pathology. Accordingly they meet all the criteria and qualification for for radiofrequency ablation and we are going to complete this today hopefully for more longer term relief as part of comprehensive management program.   ROS Otherwise per HPI.  Assessment & Plan: Visit Diagnoses:    ICD-10-CM   1. Cervical spondylosis without myelopathy  M47.812 XR C-ARM NO REPORT    Radiofrequency,Cervical    methylPREDNISolone  acetate (DEPO-MEDROL ) injection 40 mg      Plan: No additional findings.   Meds & Orders:  Meds ordered this encounter  Medications   methylPREDNISolone  acetate (DEPO-MEDROL ) injection 40 mg    Orders Placed This Encounter  Procedures   Radiofrequency,Cervical   XR C-ARM NO REPORT    Follow-up: Return if symptoms worsen or fail to improve.   Procedures: No procedures performed  Cervical Facet Nerve Denervation  Patient: Cindy Stevenson      Date of Birth: 11-09-1975 MRN: [id number] PCP: Lizabeth Riggs, PA-C      Visit Date: 02/20/2024   Universal Protocol:    Date/Time: 05/12/255:37 AM  Consent Given By: the patient  Position: PRONE  Additional Comments: Vital signs were monitored before and after the procedure. Patient was prepped and draped in the usual sterile fashion. The correct patient, procedure, and site was verified.   Injection Procedure Details:   Procedure diagnoses:  1. Cervical spondylosis without myelopathy      Meds Administered:  Meds ordered this encounter  Medications   methylPREDNISolone  acetate (DEPO-MEDROL ) injection 40 mg     Laterality: Bilateral  Location/Site:  C2-3  Needle size: 18 G  Needle type: RF cannula, 5 mm active tip  Findings:  -Comments:   Procedure Details: The fluoroscope beam was positioned to square off the endplates of the desired vertebral level to achieve a true AP position. The beam was then moved in a small "counter" oblique to the contralateral side with a small amount of caudal tilt to achieve a trajectory alignment with the desired nerves.  For each target described below the skin was anesthetized with 1 ml of 1% Lidocaine  without epinephrine .  To denervate the facet joint nerve to C2 (third occipital nerve), the cannula was advanced under fluoroscopic guidance and positioned over the inferior lateral portion of the C2/3 facet joint nerve where the third occipital nerve lies.  A minimum of three lesions were made along the location of the nerve.   For all of these levels, AP and lateral images were  used to confirm location.  The radiofrequency probe was inserted into the cannula and stimulation was carried out at both sensory and motor levels to make sure there was expected stimulation without a radicular pattern. Subsequently, this was removed and then 0.5 to 1 ml. of 1% Lidocaine  was injected. The radiofrequency probe was re-inserted and denervation of the facet nerves (medial branches of the dorsal rami  innervating the facet joints) was carried out at 80 degrees Celsius for 90 seconds. The above procedure was repeated for each facet joint nerve mentioned above. Radiographs were obtained at each level (unless otherwise noted) to verify probe placement during the neurotomy.  Additional Comments:  The patient tolerated the procedure well Dressing: Band-Aid    Post-procedure details: Patient was observed during the procedure. Post-procedure instructions were reviewed. Patient left the clinic in stable condition.      Clinical History: No specialty comments available.     Objective:  VS:  HT:    WT:   BMI:     BP:129/86  HR:89bpm  TEMP: ( )  RESP:  Physical Exam Vitals and nursing note reviewed.  Constitutional:      General: She is not in acute distress.    Appearance: Normal appearance. She is not ill-appearing.  HENT:     Head: Normocephalic and atraumatic.     Right Ear: External ear normal.     Left Ear: External ear normal.  Eyes:     Extraocular Movements: Extraocular movements intact.  Cardiovascular:     Rate and Rhythm: Normal rate.     Pulses: Normal pulses.  Musculoskeletal:     Cervical back: Tenderness present. No rigidity.     Right lower leg: No edema.     Left lower leg: No edema.     Comments: Patient has good strength in the upper extremities including 5 out of 5 strength in wrist extension long finger flexion and APB.  There is no atrophy of the hands intrinsically.  There is a negative Hoffmann's test.   Lymphadenopathy:     Cervical: No cervical adenopathy.  Skin:    Findings: No erythema, lesion or rash.  Neurological:     General: No focal deficit present.     Mental Status: She is alert and oriented to person, place, and time.     Sensory: No sensory deficit.     Motor: No weakness or abnormal muscle tone.     Coordination: Coordination normal.  Psychiatric:        Mood and Affect: Mood normal.        Behavior: Behavior normal.       Imaging: No results found.

## 2024-02-28 NOTE — Patient Instructions (Signed)
 Cindy Stevenson  02/28/2024     @PREFPERIOPPHARMACY @   Your procedure is scheduled on 03/04/2024.   Report to [facility] at  0600  A.M.   Call this number if you have problems the morning of surgery:  [phone]  If you experience any cold or flu symptoms such as cough, fever, chills, shortness of breath, etc. between now and your scheduled surgery, please notify us  at the above number.   Remember:        DO NOT take any medications for diabetes the morning of your procedure.         Use your inhaler before you come and bring your rescue inhaler with you.   Do not eat after midnight.   You may drink clear liquids until 0330 am on 03/04/2024.    Clear liquids allowed are:                    Water , Juice (No red color; non-citric and without pulp; diabetics please choose diet or no sugar options), Carbonated beverages (diabetics please choose diet or no sugar options), Clear Tea (No creamer, milk, or cream, including half & half and powdered creamer), Black Coffee Only (No creamer, milk or cream, including half & half and powdered creamer), and Clear Sports drink (No red color; diabetics please choose diet or no sugar options)    Take these medicines the morning of surgery with A SIP OF WATER            alprazolam(if needed), baclofen , esomeprazole, gabapentin , labetolol, naratriptan , tizanidine (if needed), zofran  (if needed).     Do not wear jewelry, make-up or nail polish, including gel polish,  artificial nails, or any other type of covering on natural nails (fingers and  toes).  Do not wear lotions, powders, or perfumes, or deodorant.  Do not shave 48 hours prior to surgery.  Men may shave face and neck.  Do not bring valuables to the hospital.  [facility] is not responsible for any belongings or valuables.  Contacts, dentures or bridgework may not be worn into surgery.  Leave your suitcase in the car.  After surgery it may be brought to your room.  For patients  admitted to the hospital, discharge time will be determined by your treatment team.  Patients discharged the day of surgery will not be allowed to drive home and must have someone with them for 24 hours.    Special instructions:   DO NOT smoke tobacco or vape for 24 hours before your procedure.  Please read over the following fact sheets that you were given. Coughing and Deep Breathing, Surgical Site Infection Prevention, Anesthesia Post-op Instructions, and Care and Recovery After Surgery      Incision Care, Adult An incision is a cut that a doctor makes in your skin for surgery. Most times, these cuts are closed after surgery. Your cut from surgery may be closed with: Stitches (sutures). Staples. Skin glue. Skin tape (adhesive) strips. You may need to go back to your doctor to have stitches or staples taken out. This may happen many days or many weeks after your surgery. You need to take good care of your cut so it does not get infected. Follow instructions from your doctor about how to care for your cut. Supplies needed: Soap and water . A clean hand towel. Wound cleanser. A clean bandage (dressing), if needed. Cream or ointment, if told by your doctor. Clean gauze. How to care for  your cut from surgery Cleaning your cut Ask your doctor how to clean your cut. You may need to: Wear medical gloves. Use mild soap and water , or a wound cleanser. Use a clean gauze to pat your cut dry after you clean it. Changing your bandage Wash your hands with soap and water  for at least 20 seconds before and after you change your bandage. If you cannot use soap and water , use hand sanitizer. Do not usedisinfectants or antiseptics, such as rubbing alcohol, to clean your wound unless told by your doctor. Change your bandage as told by your doctor. Leavestitches or skin glue in place for at least 2 weeks. Leave tape strips alone unless you are told to take them off. You may trim the edges of the  tape strips if they curl up. Put a cream or ointment on your cut. Do this only as told. Cover your cut with a clean bandage. Ask your doctor when you can leave your cut uncovered. Checking for infection Check your cut area every day for signs of infection. Check for: More redness, swelling, or pain. More fluid or blood. New warmth. Hardness or a new rash around the incision. Pus or a bad smell.  Follow these instructions at home Medicines Take over-the-counter and prescription medicines only as told by your doctor. If you were prescribed an antibiotic medicine, cream, or ointment, use it as told by your doctor. Do not stop using the antibiotic even if you start to feel better. Eating and drinking Eat foods that have a lot of certain nutrients, such as protein, vitamin A, and vitamin C. These foods help your cut heal. Foods rich in protein include meat, fish, eggs, dairy, beans, nuts, and protein drinks. Foods rich in vitamin A include carrots and dark green, leafy vegetables. Foods rich in vitamin C include citrus fruits, tomatoes, broccoli, and peppers. Drink enough fluid to keep your pee (urine) pale yellow. General instructions  Do not take baths, swim, or use a hot tub. Ask your doctor about taking showers or sponge baths. Limit movement around your cut. This helps with healing. Try not to strain, lift, or exercise for the first 2 weeks, or for as long as told by your doctor. Return to your normal activities as told by your doctor. Ask your doctor what activities are safe for you. Do not scratch, scrub, or pick at your cut. Keep it covered as told by your doctor. Protect your cut from the sun when you are outside for the first 6 months, or for as long as told by your doctor. Cover up the scar area or put on sunscreen that has an SPF of at least 30. Do not use any products that contain nicotine or tobacco, such as cigarettes, e-cigarettes, and chewing tobacco. These can delay cut  healing. If you need help quitting, ask your doctor. Keep all follow-up visits. Contact a doctor if: You have any of these signs of infection around your cut: More redness, swelling, or pain. More fluid or blood. New warmth or hardness. Pus or a bad smell. A new rash. You have a fever. You feel like you may vomit (nauseous). You vomit. You are dizzy. Your stitches, staples, skin glue, or tape strips come undone. Your cut gets bigger. You have a fever. Get help right away if: Your cut bleeds through your bandage, and bleeding does not stop with gentle pressure. Your cut opens up and comes apart. These symptoms may be an emergency. Do not wait to  see if the symptoms will go away. Get medical help right away. Call your local emergency services (911 in the [place] Do not drive yourself to the hospital. Summary Follow instructions from your doctor about how to care for your cut. Wash your hands with soap and water  for at least 20 seconds before and after you change your bandage. If you cannot use soap and water , use hand sanitizer. Check your cut area every day for signs of infection. Keep all follow-up visits. This information is not intended to replace advice given to you by your health care provider. Make sure you discuss any questions you have with your health care provider. Document Revised: 01/04/2021 Document Reviewed: 01/04/2021 Elsevier Patient Education  2024 Elsevier Inc.General Anesthesia, Adult, Care After The following information offers guidance on how to care for yourself after your procedure. Your health care provider may also give you more specific instructions. If you have problems or questions, contact your health care provider. What can I expect after the procedure? After the procedure, it is common for people to: Have pain or discomfort at the IV site. Have nausea or vomiting. Have a sore throat or hoarseness. Have trouble concentrating. Feel cold or chills. Feel  weak, sleepy, or tired (fatigue). Have soreness and body aches. These can affect parts of the body that were not involved in surgery. Follow these instructions at home: For the time period you were told by your health care provider:  Rest. Do not participate in activities where you could fall or become injured. Do not drive or use machinery. Do not drink alcohol. Do not take sleeping pills or medicines that cause drowsiness. Do not make important decisions or sign legal documents. Do not take care of children on your own. General instructions Drink enough fluid to keep your urine pale yellow. If you have sleep apnea, surgery and certain medicines can increase your risk for breathing problems. Follow instructions from your health care provider about wearing your sleep device: Anytime you are sleeping, including during daytime naps. While taking prescription pain medicines, sleeping medicines, or medicines that make you drowsy. Return to your normal activities as told by your health care provider. Ask your health care provider what activities are safe for you. Take over-the-counter and prescription medicines only as told by your health care provider. Do not use any products that contain nicotine or tobacco. These products include cigarettes, chewing tobacco, and vaping devices, such as e-cigarettes. These can delay incision healing after surgery. If you need help quitting, ask your health care provider. Contact a health care provider if: You have nausea or vomiting that does not get better with medicine. You vomit every time you eat or drink. You have pain that does not get better with medicine. You cannot urinate or have bloody urine. You develop a skin rash. You have a fever. Get help right away if: You have trouble breathing. You have chest pain. You vomit blood. These symptoms may be an emergency. Get help right away. Call 911. Do not wait to see if the symptoms will go away. Do  not drive yourself to the hospital. Summary After the procedure, it is common to have a sore throat, hoarseness, nausea, vomiting, or to feel weak, sleepy, or fatigue. For the time period you were told by your health care provider, do not drive or use machinery. Get help right away if you have difficulty breathing, have chest pain, or vomit blood. These symptoms may be an emergency. This information is not  intended to replace advice given to you by your health care provider. Make sure you discuss any questions you have with your health care provider. Document Revised: 12/31/2021 Document Reviewed: 12/31/2021 Elsevier Patient Education  2024 Elsevier Inc.How to Use Chlorhexidine  at Home in the Shower Chlorhexidine  gluconate (CHG) is a germ-killing (antiseptic) wash that's used to clean the skin. It can get rid of the germs that normally live on the skin and can keep them away for about 24 hours. If you're having surgery, you may be told to shower with CHG at home the night before surgery. This can help lower your risk for infection. To use CHG wash in the shower, follow the steps below. Supplies needed: CHG body wash. Clean washcloth. Clean towel. How to use CHG in the shower Follow these steps unless you're told to use CHG in a different way: Start the shower. Use your normal soap and shampoo to wash your face and hair. Turn off the shower or move out of the shower stream. Pour CHG onto a clean washcloth. Do not use any type of brush or rough sponge. Start at your neck, washing your body down to your toes. Make sure you: Wash the part of your body where the surgery will be done for at least 1 minute. Do not scrub. Do not use CHG on your head or face unless your health care provider tells you to. If it gets into your ears or eyes, rinse them well with water . Do not wash your genitals with CHG. Wash your back and under your arms. Make sure to wash skin folds. Let the CHG sit on your skin for  1-2 minutes or as long as told. Rinse your entire body in the shower, including all body creases and folds. Turn off the shower. Dry off with a clean towel. Do not put anything on your skin afterward, such as powder, lotion, or perfume. Put on clean clothes or pajamas. If it's the night before surgery, sleep in clean sheets. General tips Use CHG only as told, and follow the instructions on the label. Use the full amount of CHG as told. This is often one bottle. Do not smoke and stay away from flames after using CHG. Your skin may feel sticky after using CHG. This is normal. The sticky feeling will go away as the CHG dries. Do not use CHG: If you have a chlorhexidine  allergy or have reacted to chlorhexidine  in the past. On open wounds or areas of skin that have broken skin, cuts, or scrapes. On babies younger than 62 months of age. Contact a health care provider if: You have questions about using CHG. Your skin gets irritated or itchy. You have a rash after using CHG. You swallow any CHG. Call your local poison control center 2046005346 in the [place] Your eyes itch badly, or they become very red or swollen. Your hearing changes. You have trouble seeing. If you can't reach your provider, go to an urgent care or emergency room. Do not drive yourself. Get help right away if: You have swelling or tingling in your mouth or throat. You make high-pitched whistling sounds when you breathe, most often when you breathe out (wheeze). You have trouble breathing. These symptoms may be an emergency. Call 911 right away. Do not wait to see if the symptoms will go away. Do not drive yourself to the hospital. This information is not intended to replace advice given to you by your health care provider. Make sure you discuss any  questions you have with your health care provider. Document Revised: 04/18/2023 Document Reviewed: 04/14/2022 Elsevier Patient Education  2024 [employer].

## 2024-02-29 ENCOUNTER — Encounter (HOSPITAL_COMMUNITY): Payer: Self-pay

## 2024-02-29 ENCOUNTER — Encounter (HOSPITAL_COMMUNITY)
Admission: RE | Admit: 2024-02-29 | Discharge: 2024-02-29 | Disposition: A | Discharge status: Home / Self Care | Source: Ambulatory Visit | Attending: Surgery | Admitting: Surgery

## 2024-02-29 ENCOUNTER — Other Ambulatory Visit: Payer: Self-pay

## 2024-02-29 VITALS — BP 126/84 | HR 90 | Temp 98.4°F | Resp 16

## 2024-02-29 DIAGNOSIS — Z01812 Encounter for preprocedural laboratory examination: Secondary | ICD-10-CM | POA: Insufficient documentation

## 2024-02-29 DIAGNOSIS — E119 Type 2 diabetes mellitus without complications: Secondary | ICD-10-CM | POA: Diagnosis not present

## 2024-02-29 LAB — HEMOGLOBIN A1C
Hgb A1c MFr Bld: 7.6 % — ABNORMAL HIGH (ref 4.8–5.6)
Mean Plasma Glucose: 171.42 mg/dL

## 2024-03-04 ENCOUNTER — Ambulatory Visit (HOSPITAL_COMMUNITY)
Admission: RE | Admit: 2024-03-04 | Discharge: 2024-03-04 | Disposition: A | Discharge status: Home / Self Care | Attending: Surgery | Admitting: Surgery

## 2024-03-04 ENCOUNTER — Ambulatory Visit (HOSPITAL_BASED_OUTPATIENT_CLINIC_OR_DEPARTMENT_OTHER): Admitting: Certified Registered"

## 2024-03-04 ENCOUNTER — Encounter (HOSPITAL_COMMUNITY)
Admission: RE | Disposition: A | Discharge status: Home / Self Care | Payer: Self-pay | Source: Home / Self Care | Attending: Surgery

## 2024-03-04 ENCOUNTER — Encounter (HOSPITAL_COMMUNITY): Payer: Self-pay | Admitting: Surgery

## 2024-03-04 ENCOUNTER — Ambulatory Visit (HOSPITAL_COMMUNITY): Admitting: Certified Registered"

## 2024-03-04 DIAGNOSIS — E66813 Obesity, class 3: Secondary | ICD-10-CM | POA: Diagnosis not present

## 2024-03-04 DIAGNOSIS — Z833 Family history of diabetes mellitus: Secondary | ICD-10-CM | POA: Insufficient documentation

## 2024-03-04 DIAGNOSIS — E119 Type 2 diabetes mellitus without complications: Secondary | ICD-10-CM | POA: Insufficient documentation

## 2024-03-04 DIAGNOSIS — R519 Headache, unspecified: Secondary | ICD-10-CM | POA: Diagnosis not present

## 2024-03-04 DIAGNOSIS — Z6841 Body Mass Index (BMI) 40.0 and over, adult: Secondary | ICD-10-CM | POA: Insufficient documentation

## 2024-03-04 DIAGNOSIS — J45909 Unspecified asthma, uncomplicated: Secondary | ICD-10-CM | POA: Diagnosis not present

## 2024-03-04 DIAGNOSIS — D171 Benign lipomatous neoplasm of skin and subcutaneous tissue of trunk: Secondary | ICD-10-CM

## 2024-03-04 DIAGNOSIS — E65 Localized adiposity: Secondary | ICD-10-CM | POA: Insufficient documentation

## 2024-03-04 DIAGNOSIS — I10 Essential (primary) hypertension: Secondary | ICD-10-CM | POA: Diagnosis not present

## 2024-03-04 DIAGNOSIS — F419 Anxiety disorder, unspecified: Secondary | ICD-10-CM | POA: Insufficient documentation

## 2024-03-04 DIAGNOSIS — K219 Gastro-esophageal reflux disease without esophagitis: Secondary | ICD-10-CM | POA: Diagnosis not present

## 2024-03-04 DIAGNOSIS — R1901 Right upper quadrant abdominal swelling, mass and lump: Secondary | ICD-10-CM

## 2024-03-04 DIAGNOSIS — F32A Depression, unspecified: Secondary | ICD-10-CM | POA: Insufficient documentation

## 2024-03-04 DIAGNOSIS — Z7951 Long term (current) use of inhaled steroids: Secondary | ICD-10-CM | POA: Diagnosis not present

## 2024-03-04 HISTORY — PX: EXCISION OF ABDOMINAL WALL TUMOR: SHX6687

## 2024-03-04 LAB — GLUCOSE, CAPILLARY: Glucose-Capillary: 196 mg/dL — ABNORMAL HIGH (ref 70–99)

## 2024-03-04 SURGERY — EXCISION, NEOPLASM, ABDOMINAL WALL
Anesthesia: General | Site: Abdomen | Laterality: Right

## 2024-03-04 MED ORDER — SUCCINYLCHOLINE 20MG/ML (10ML) SYRINGE FOR MEDFUSION PUMP - OPTIME
INTRAMUSCULAR | Status: DC | PRN
  Administered 2024-03-04: 60 mg via INTRAVENOUS
  Administered 2024-03-04: 120 mg via INTRAVENOUS

## 2024-03-04 MED ORDER — FENTANYL CITRATE (PF) 100 MCG/2ML IJ SOLN
INTRAMUSCULAR | Status: AC
  Filled 2024-03-04: qty 2

## 2024-03-04 MED ORDER — BUPIVACAINE HCL (PF) 0.5 % IJ SOLN
INTRAMUSCULAR | Status: AC
Start: 2024-03-04 — End: ?
  Filled 2024-03-04: qty 30

## 2024-03-04 MED ORDER — PHENYLEPHRINE 80 MCG/ML (10ML) SYRINGE FOR IV PUSH (FOR BLOOD PRESSURE SUPPORT)
PREFILLED_SYRINGE | INTRAVENOUS | Status: DC | PRN
Start: 2024-03-04 — End: 2024-03-04
  Administered 2024-03-04 (×2): 80 ug via INTRAVENOUS

## 2024-03-04 MED ORDER — ORAL CARE MOUTH RINSE: 15.0000 mL | Freq: Once | OROMUCOSAL | Status: AC

## 2024-03-04 MED ORDER — CHLORHEXIDINE GLUCONATE CLOTH 2 % EX PADS: 6.0000 | MEDICATED_PAD | Freq: Once | CUTANEOUS | Status: DC

## 2024-03-04 MED ORDER — MIDAZOLAM HCL 2 MG/2ML IJ SOLN
INTRAMUSCULAR | Status: AC
  Filled 2024-03-04: qty 2

## 2024-03-04 MED ORDER — ONDANSETRON HCL 4 MG/2ML IJ SOLN
INTRAMUSCULAR | Status: DC | PRN
  Administered 2024-03-04: 4 mg via INTRAVENOUS

## 2024-03-04 MED ORDER — PROPOFOL 10 MG/ML IV BOLUS
INTRAVENOUS | Status: AC
  Filled 2024-03-04: qty 20

## 2024-03-04 MED ORDER — 0.9 % SODIUM CHLORIDE (POUR BTL) OPTIME
TOPICAL | Status: DC | PRN
  Administered 2024-03-04: 1000 mL

## 2024-03-04 MED ORDER — DEXAMETHASONE SODIUM PHOSPHATE 10 MG/ML IJ SOLN
INTRAMUSCULAR | Status: AC
  Filled 2024-03-04: qty 1

## 2024-03-04 MED ORDER — PHENYLEPHRINE 80 MCG/ML (10ML) SYRINGE FOR IV PUSH (FOR BLOOD PRESSURE SUPPORT)
PREFILLED_SYRINGE | INTRAVENOUS | Status: AC
  Filled 2024-03-04: qty 10

## 2024-03-04 MED ORDER — ROCURONIUM BROMIDE 10 MG/ML (PF) SYRINGE
PREFILLED_SYRINGE | INTRAVENOUS | Status: AC
  Filled 2024-03-04: qty 10

## 2024-03-04 MED ORDER — LIDOCAINE 2% (20 MG/ML) 5 ML SYRINGE
INTRAMUSCULAR | Status: AC
Start: 2024-03-04 — End: ?
  Filled 2024-03-04: qty 5

## 2024-03-04 MED ORDER — ROCURONIUM 10MG/ML (10ML) SYRINGE FOR MEDFUSION PUMP - OPTIME
INTRAVENOUS | Status: DC | PRN
  Administered 2024-03-04: 8 mg via INTRAVENOUS

## 2024-03-04 MED ORDER — MIDAZOLAM HCL 5 MG/5ML IJ SOLN
INTRAMUSCULAR | Status: DC | PRN
  Administered 2024-03-04: 2 mg via INTRAVENOUS

## 2024-03-04 MED ORDER — CHLORHEXIDINE GLUCONATE 0.12 % MT SOLN
15.0000 mL | Freq: Once | OROMUCOSAL | Status: AC
  Administered 2024-03-04: 15 mL via OROMUCOSAL

## 2024-03-04 MED ORDER — ACETAMINOPHEN 500 MG PO TABS
1000.0000 mg | ORAL_TABLET | Freq: Four times a day (QID) | ORAL | 0 refills | Status: AC
Start: 2024-03-04 — End: 2024-03-11

## 2024-03-04 MED ORDER — BUPIVACAINE HCL (PF) 0.5 % IJ SOLN
INTRAMUSCULAR | Status: DC | PRN
  Administered 2024-03-04: 30 mL

## 2024-03-04 MED ORDER — DEXMEDETOMIDINE HCL IN NACL 80 MCG/20ML IV SOLN
INTRAVENOUS | Status: AC
  Filled 2024-03-04: qty 20

## 2024-03-04 MED ORDER — DEXAMETHASONE SODIUM PHOSPHATE 10 MG/ML IJ SOLN
INTRAMUSCULAR | Status: DC | PRN
  Administered 2024-03-04: 10 mg via INTRAVENOUS

## 2024-03-04 MED ORDER — LACTATED RINGERS IV SOLN: INTRAVENOUS | Status: DC

## 2024-03-04 MED ORDER — LIDOCAINE HCL (CARDIAC) PF 50 MG/5ML IV SOSY
PREFILLED_SYRINGE | INTRAVENOUS | Status: DC | PRN
  Administered 2024-03-04 (×2): 75 mg via INTRAVENOUS

## 2024-03-04 MED ORDER — FENTANYL CITRATE (PF) 100 MCG/2ML IJ SOLN
INTRAMUSCULAR | Status: DC | PRN
  Administered 2024-03-04 (×2): 50 ug via INTRAVENOUS

## 2024-03-04 MED ORDER — ONDANSETRON HCL 4 MG/2ML IJ SOLN
INTRAMUSCULAR | Status: AC
  Filled 2024-03-04: qty 2

## 2024-03-04 MED ORDER — PROPOFOL 10 MG/ML IV BOLUS
INTRAVENOUS | Status: DC | PRN
  Administered 2024-03-04: 50 mg via INTRAVENOUS
  Administered 2024-03-04: 180 mg via INTRAVENOUS

## 2024-03-04 MED ORDER — OXYCODONE HCL 5 MG PO TABS: 5.0000 mg | ORAL_TABLET | Freq: Four times a day (QID) | ORAL | 0 refills | Status: DC | PRN

## 2024-03-04 SURGICAL SUPPLY — 24 items
APPLICATOR CHLORAPREP 10.5 ORG (MISCELLANEOUS) ×1 IMPLANT
CLOTH BEACON ORANGE TIMEOUT ST (SAFETY) ×1 IMPLANT
COVER LIGHT HANDLE STERIS (MISCELLANEOUS) ×2 IMPLANT
DERMABOND ADVANCED .7 DNX12 (GAUZE/BANDAGES/DRESSINGS) IMPLANT
DRAPE EENT ADH APERT 31X51 STR (DRAPES) IMPLANT
ELECTRODE REM PT RTRN 9FT ADLT (ELECTROSURGICAL) ×1 IMPLANT
GAUZE SPONGE 4X4 12PLY STRL (GAUZE/BANDAGES/DRESSINGS) IMPLANT
GLOVE BIOGEL PI IND STRL 6.5 (GLOVE) ×1 IMPLANT
GLOVE BIOGEL PI IND STRL 7.0 (GLOVE) ×2 IMPLANT
GLOVE SURG SS PI 6.5 STRL IVOR (GLOVE) ×2 IMPLANT
GOWN STRL REUS W/TWL LRG LVL3 (GOWN DISPOSABLE) ×2 IMPLANT
KIT TURNOVER KIT A (KITS) ×1 IMPLANT
MANIFOLD NEPTUNE II (INSTRUMENTS) ×1 IMPLANT
NDL HYPO 25X1 1.5 SAFETY (NEEDLE) ×1 IMPLANT
NEEDLE HYPO 25X1 1.5 SAFETY (NEEDLE) ×1 IMPLANT
NS IRRIG 1000ML POUR BTL (IV SOLUTION) ×1 IMPLANT
PACK MINOR (CUSTOM PROCEDURE TRAY) IMPLANT
PAD ARMBOARD POSITIONER FOAM (MISCELLANEOUS) ×1 IMPLANT
POSITIONER HEAD 8X9X4 ADT (SOFTGOODS) ×1 IMPLANT
SET BASIN LINEN APH (SET/KITS/TRAYS/PACK) ×1 IMPLANT
SUT MNCRL AB 4-0 PS2 18 (SUTURE) ×1 IMPLANT
SUT VIC AB 3-0 SH 27X BRD (SUTURE) ×1 IMPLANT
SYR BULB IRRIG 60ML STRL (SYRINGE) IMPLANT
TAPE CLOTH SOFT 2X10 (GAUZE/BANDAGES/DRESSINGS) IMPLANT

## 2024-03-04 NOTE — Progress Notes (Signed)
[facility]  Spoke with the patient's family in the consultation room.  I explained that she tolerated the procedure without difficulty.  She has dissolvable stitches under the skin with overlying skin glue.  This will flake off in 10 to 14 days.  I discharged her home with a prescription for narcotic pain medication that they should take as needed for pain.  I also want her taking scheduled Tylenol.  If they take the narcotic pain medication, they should take a stool softener as well.  The patient will follow-up with me in 2 weeks.  All questions were answered to her expressed satisfaction.  Theophilus Kinds, DO [facility] [address] [place], [place] [address] [phone] (office)

## 2024-03-04 NOTE — Anesthesia Postprocedure Evaluation (Signed)
 Anesthesia Post Note  Patient: Cindy Stevenson  Procedure(s) Performed: EXCISION, NEOPLASM, ABDOMINAL WALL (Right: Abdomen)  Anesthesia Type: General Anesthetic complications: no   No notable events documented.   Last Vitals:  Vitals:   03/04/24 0915 03/04/24 0930  BP: (!) 142/100 (!) 141/99  Pulse: (!) 103 93  Resp: 16 18  Temp:    SpO2: (!) 88% 97%    Last Pain:  Vitals:   03/04/24 0930  TempSrc:   PainSc: 0-No pain                 Beacher Limerick

## 2024-03-04 NOTE — Anesthesia Postprocedure Evaluation (Signed)
 Anesthesia Post Note  Patient: Cindy Stevenson  Procedure(s) Performed: EXCISION, NEOPLASM, ABDOMINAL WALL (Right: Abdomen)  Patient location during evaluation: PACU Anesthesia Type: General Level of consciousness: awake and alert Pain management: pain level controlled Vital Signs Assessment: post-procedure vital signs reviewed and stable Respiratory status: spontaneous breathing, nonlabored ventilation, respiratory function stable and patient connected to nasal cannula oxygen Cardiovascular status: blood pressure returned to baseline and stable Postop Assessment: no apparent nausea or vomiting Anesthetic complications: no  No notable events documented.   Last Vitals:  Vitals:   03/04/24 0930 03/04/24 0947  BP: (!) 141/99 127/84  Pulse: 93 80  Resp: 18 16  Temp:  36.6 C  SpO2: 97% 100%    Last Pain:  Vitals:   03/04/24 0947  TempSrc: Oral  PainSc: 0-No pain                 Beacher Limerick

## 2024-03-04 NOTE — Anesthesia Procedure Notes (Signed)
 Procedure Name: Intubation Date/Time: 03/04/2024 7:58 AM  Performed by: Leeanne Puffer, CRNAPre-anesthesia Checklist: Patient identified, Patient being monitored, Timeout performed, Emergency Drugs available and Suction available Patient Re-evaluated:Patient Re-evaluated prior to induction Oxygen Delivery Method: Circle system utilized Preoxygenation: Pre-oxygenation with 100% oxygen Induction Type: IV induction Ventilation: Mask ventilation without difficulty Laryngoscope Size: Mac and 3 Grade View: Grade I Tube type: Oral Tube size: 7.0 mm Number of attempts: 1 Airway Equipment and Method: Stylet Placement Confirmation: ETT inserted through vocal cords under direct vision, positive ETCO2 and breath sounds checked- equal and bilateral Secured at: 21 cm Tube secured with: Tape Dental Injury: Teeth and Oropharynx as per pre-operative assessment  Comments: Leak in ETT cuff, not maintaining volumes, ETT changed.

## 2024-03-04 NOTE — Interval H&P Note (Signed)
 History and Physical Interval Note:  03/04/2024 7:22 AM  Cindy Stevenson  has presented today for surgery, with the diagnosis of ABDOMINAL MASS, RUQ  2 CM.  The various methods of treatment have been discussed with the patient and family. After consideration of risks, benefits and other options for treatment, the patient has consented to  Procedure(s): EXCISION, NEOPLASM, ABDOMINAL WALL (Right) as a surgical intervention.  The patient's history has been reviewed, patient examined, no change in status, stable for surgery.  I have reviewed the patient's chart and labs.  Questions were answered to the patient's satisfaction.     [name] A Bon [place]is

## 2024-03-04 NOTE — Discharge Instructions (Addendum)
 Ambulatory Surgery Discharge Instructions  General Anesthesia or Sedation Do not drive or operate heavy machinery for 24 hours.  Do not consume alcohol, tranquilizers, sleeping medications, or any non-prescribed medications for 24 hours. Do not make important decisions or sign any important papers in the next 24 hours. You should have someone with you tonight at home.  Activity  You are advised to go directly home from the hospital.  Restrict your activities and rest for a day.  Resume light activity tomorrow. No heavy lifting over 10 lbs or strenuous exercise.  Fluids and Diet Begin with clear liquids, bouillon, dry toast, soda crackers.  If not nauseated, you may go to a regular diet when you desire.  Greasy and spicy foods are not advised.  Medications  If you have not had a bowel movement in 24 hours, take 2 tablespoons over the counter Milk of mag.             You May resume your blood thinners tomorrow (Aspirin, coumadin, or other).  You are being discharged with prescriptions for Opioid/Narcotic Medications: There are some specific considerations for these medications that you should know. Opioid Meds have risks & benefits. Addiction to these meds is always a concern with prolonged use Take medication only as directed Do not drive while taking narcotic pain medication Do not crush tablets or capsules Do not use a different container than medication was dispensed in Lock the container of medication in a cool, dry place out of reach of children and pets. Opioid medication can cause addiction Do not share with anyone else (this is a felony) Do not store medications for future use. Dispose of them properly.     Disposal:  Find a [employer] household drug take back site near you.  If you can't get to a drug take back site, use the recipe below as a last resort to dispose of expired, unused or unwanted drugs. Disposal  (Do not dispose chemotherapy drugs this way, talk to your  prescribing doctor instead.) Step 1: Mix drugs (do not crush) with dirt, kitty litter, or used coffee grounds and add a small amount of water to dissolve any solid medications. Step 2: Seal drugs in plastic bag. Step 3: Place plastic bag in trash. Step 4: Take prescription container and scratch out personal information, then recycle or throw away.  Operative Site  You have a liquid bandage over your incisions, this will begin to flake off in about a week. Ok to English as a second language teacher. Keep wound clean and dry. No baths or swimming. No lifting more than 10 pounds.  Contact Information: If you have questions or concerns, please call our office, [phone], Monday- Thursday 8AM-5PM and Friday 8AM-12Noon.  If it is after hours or on the weekend, please call [facility] Main Number, [phone], and ask to speak to the surgeon on call for Dr. [facility].   SPECIFIC COMPLICATIONS TO WATCH FOR: Inability to urinate Fever over 101? F by mouth Nausea and vomiting lasting longer than 24 hours. Pain not relieved by medication ordered Swelling around the operative site Increased redness, warmth, hardness, around operative area Numbness, tingling, or cold fingers or toes Blood -soaked dressing, (small amounts of oozing may be normal) Increasing and progressive drainage from surgical area or exam site  Post Anesthesia Home Care Instructions  Activity: Get plenty of rest for the remainder of the day. A responsible individual must stay with you for 24 hours following the procedure.  For the next 24  hours, DO NOT: -Drive a car -Advertising copywriter -Drink alcoholic beverages -Take any medication unless instructed by your physician -Make any legal decisions or sign important papers.  Meals: Start with liquid foods such as gelatin or soup. Progress to regular foods as tolerated. Avoid greasy, spicy, heavy foods. If nausea and/or vomiting occur, drink only clear liquids until the nausea and/or vomiting  subsides. Call your physician if vomiting continues.  Special Instructions/Symptoms: Your throat may feel dry or sore from the anesthesia or the breathing tube placed in your throat during surgery. If this causes discomfort, gargle with warm salt water. The discomfort should disappear within 24 hours.

## 2024-03-04 NOTE — Anesthesia Preprocedure Evaluation (Addendum)
 Anesthesia Evaluation  Patient identified by MRN, date of birth, ID band Patient awake    Reviewed: Allergy & Precautions, H&P , NPO status , Patient's Chart, lab work & pertinent test results  Airway Mallampati: II  TM Distance: >3 FB Neck ROM: Full    Dental no notable dental hx.    Pulmonary asthma    Pulmonary exam normal breath sounds clear to auscultation       Cardiovascular hypertension, Normal cardiovascular exam Rhythm:Regular Rate:Normal     Neuro/Psych  Headaches PSYCHIATRIC DISORDERS Anxiety Depression       GI/Hepatic Neg liver ROS,GERD  ,,  Endo/Other  diabetes, Type 2    Renal/GU negative Renal ROS  negative genitourinary   Musculoskeletal negative musculoskeletal ROS (+)    Abdominal   Peds negative pediatric ROS (+)  Hematology negative hematology ROS (+)   Anesthesia Other Findings   Reproductive/Obstetrics negative OB ROS                             Anesthesia Physical Anesthesia Plan  ASA: 2  Anesthesia Plan: General   Post-op Pain Management:    Induction: Intravenous  PONV Risk Score and Plan:   Airway Management Planned: Oral ETT  Additional Equipment:   Intra-op Plan:   Post-operative Plan: Extubation in OR  Informed Consent: I have reviewed the patients History and Physical, chart, labs and discussed the procedure including the risks, benefits and alternatives for the proposed anesthesia with the patient or authorized representative who has indicated his/her understanding and acceptance.     Dental advisory given  Plan Discussed with: CRNA  Anesthesia Plan Comments:         Anesthesia Quick Evaluation

## 2024-03-04 NOTE — Anesthesia Procedure Notes (Signed)
 Procedure Name: Intubation Date/Time: 03/04/2024 7:46 AM  Performed by: Leeanne Puffer, CRNAPre-anesthesia Checklist: Patient identified, Patient being monitored, Timeout performed, Emergency Drugs available and Suction available Patient Re-evaluated:Patient Re-evaluated prior to induction Oxygen Delivery Method: Circle system utilized Preoxygenation: Pre-oxygenation with 100% oxygen Induction Type: IV induction Ventilation: Mask ventilation without difficulty Laryngoscope Size: Mac and 3 Grade View: Grade I Tube type: Oral Tube size: 7.0 mm Number of attempts: 1 Airway Equipment and Method: Stylet Placement Confirmation: ETT inserted through vocal cords under direct vision, positive ETCO2 and breath sounds checked- equal and bilateral Secured at: 21 cm Tube secured with: Tape Dental Injury: Teeth and Oropharynx as per pre-operative assessment

## 2024-03-04 NOTE — Transfer of Care (Signed)
 Immediate Anesthesia Transfer of Care Note  Patient: Cindy Stevenson  Procedure(s) Performed: EXCISION, NEOPLASM, ABDOMINAL WALL (Right: Abdomen)  Patient Location: PACU  Anesthesia Type:General  Level of Consciousness: awake, alert , and oriented  Airway & Oxygen Therapy: Patient Spontanous Breathing and Patient connected to face mask oxygen  Post-op Assessment: Report given to RN and Post -op Vital signs reviewed and stable  Post vital signs: Reviewed and stable  Last Vitals:  Vitals Value Taken Time  BP 144/91 03/04/24 0906  Temp    Pulse 99 03/04/24 0909  Resp 14 03/04/24 0909  SpO2 99 % 03/04/24 0909  Vitals shown include unfiled device data.  Last Pain:  Vitals:   03/04/24 0704  TempSrc: Oral  PainSc: 0-No pain      Patients Stated Pain Goal: 5 (03/04/24 0704)  Complications: No notable events documented.

## 2024-03-04 NOTE — Addendum Note (Signed)
 Addendum  created 03/04/24 1219 by Beacher Limerick, MD   Clinical Note Signed

## 2024-03-04 NOTE — Op Note (Signed)
 [facility] Operative Note  03/04/24  Preoperative Diagnosis: Right lateral abdominal wall mass   Postoperative Diagnosis: Abnormal right lateral abdominal wall tissue   Procedure(s) Performed: Excision of right lateral abdominal wall mass   Surgeon: Lidia Reels, DO    Assistants: Donivan Furry, MS3   Anesthesia: General endotracheal   Anesthesiologist: Dr. Porter Britain   Specimens: Right abdominal wall tissue   Estimated Blood Loss: Minimal   Blood Replacement: None    Complications: None   Wound Class: Clean   Operative Indications: Patient is a 48 year old female who presents for excision of a right lateral abdominal wall mass.  She feels a lump in this area and it causes her pain when lying on that side.  She underwent an ultrasound which demonstrated a 2 cm lipoma versus prominent fat lobule.  She is agreeable to surgery at this time.  All risks and benefits of performing this procedure were discussed with the patient including pain, infection, bleeding, damage to the surrounding structures, need for more procedures or surgery, failure of symptoms to improve. The patient voiced understanding of the procedure, all questions were sought and answered, and consent was obtained.  Findings: Right lateral abdominal wall fat with prominent fat lobules   Procedure: The patient was taken to the operating room and placed supine. General endotracheal anesthesia was induced.  Patient was placed in a left lateral decubitus position.  The abdomen was prepared and draped in the usual sterile fashion.  A time-out was completed verifying correct patient, procedure, site, positioning, and implant(s) and/or special equipment prior to beginning this procedure.  A linear incision was made overlying the previously marked area of concern.  Upon palpation of the subcutaneous fat, the patient was noted to have some prominent fat lobules.  Using electrocautery, the subcutaneous  tissues were dissected around the prominent fat lobules. The abdominal wall tissue was removed in its entirety and sent to pathology for evaluation. Hemostasis was achieved using electrocautery.  There were no palpable abnormalities within the dissection cavity.  The dermis was approximated using 3-0 Vicryl sutures. The skin was closed using a running subcuticular 4-0 Monocryl stitch.  The incision was dressed with Dermabond and covered with 4 x 4's and Medipore tape.  Final inspection revealed acceptable hemostasis. All counts were correct at the end of the case. The patient was awakened from anesthesia and extubated without complication.  The patient went to the PACU in stable condition.   Lidia Reels, DO  [facility] [address] [place] [place] [place], [place] [address] [phone] (office)

## 2024-03-05 ENCOUNTER — Encounter (HOSPITAL_COMMUNITY): Payer: Self-pay | Admitting: Surgery

## 2024-03-05 ENCOUNTER — Ambulatory Visit: Admitting: Surgery

## 2024-03-05 LAB — SURGICAL PATHOLOGY

## 2024-03-06 ENCOUNTER — Other Ambulatory Visit: Payer: Self-pay | Admitting: Adult Health

## 2024-03-10 IMAGING — MR MR HEAD WO/W CM
12 series · 48 of 48 positions shown · IV contrast (multihance)
Comparison: MRI September 09, 2020 (without report).

CLINICAL DATA: Headache, new or worsening, positional (Age 18-49y)

EXAM:
MRI HEAD WITHOUT AND WITH CONTRAST
TECHNIQUE: Multiplanar, multiecho pulse sequences of the brain and surrounding
structures were obtained without and with intravenous contrast.
CONTRAST:  20mL MULTIHANCE GADOBENATE DIMEGLUMINE 529 MG/ML IV SOLN

[Series 2: T1 · sagittal · 5.0mm · 0.45mm/px · 1 of 26 slices shown]
[im 1/26]
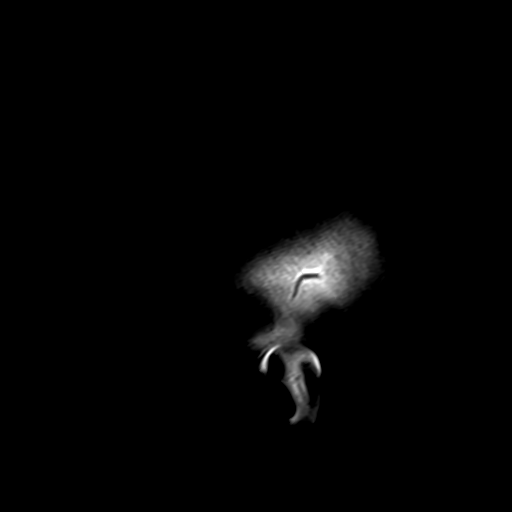

[Series 3: ax ep2d_diff_3 · axial · 3.0mm · 1.80mm/px · z∈[-79,+77]mm · 5 of 104 slices shown]
[im 1/104]
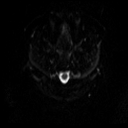
[im 26/104]
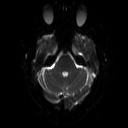
[im 52/104]
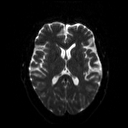
[im 78/104]
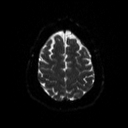
[im 104/104]
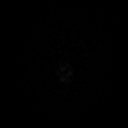

[Series 4: ax ep2d_diff_3_adc · axial · 3.0mm · 1.80mm/px · z∈[-79,+77]mm · 3 of 53 slices shown]
[im 1/53]
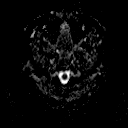
[im 27/53]
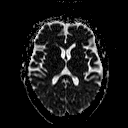
[im 53/53]
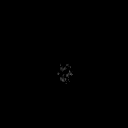

[Series 5: cor ep2d_diff · coronal · 5.0mm · 1.77mm/px · 4 of 59 slices shown]
[im 1/59]
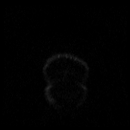
[im 20/59]
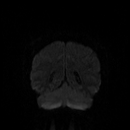
[im 39/59]
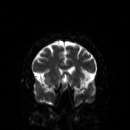
[im 59/59]
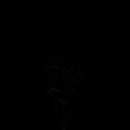

[Series 6: cor ep2d_diff_adc · coronal · 5.0mm · 1.77mm/px · 2 of 30 slices shown]
[im 1/30]
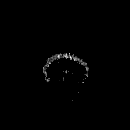
[im 30/30]
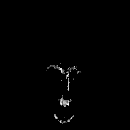

[Series 8: swi_images · axial · 2.0mm · 0.98mm/px · z∈[-79,+78]mm · 5 of 80 slices shown]
[im 1/80]
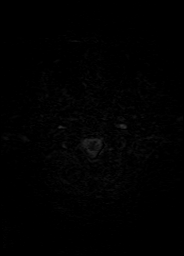
[im 20/80]
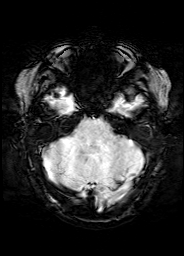
[im 40/80]
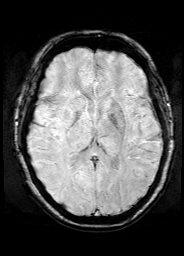
[im 60/80]
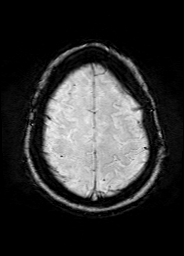
[im 80/80]
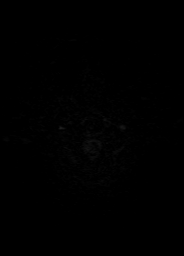

[Series 9: FLAIR · axial · 3.0mm · 0.45mm/px · z∈[-84,+75]mm · 2 of 42 slices shown]
[im 1/42]
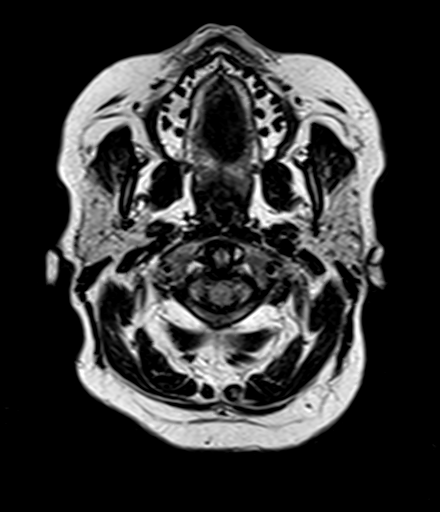
[im 42/42]
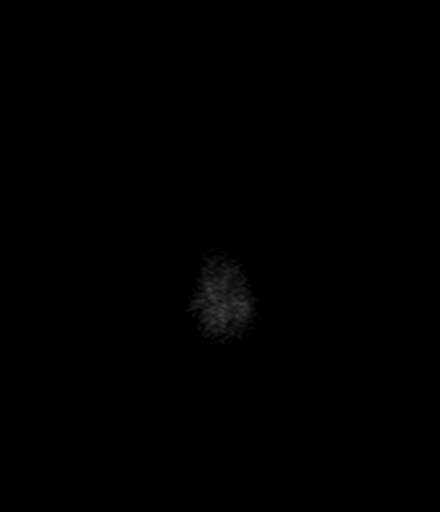

[Series 10: T2 · axial · 5.0mm · 0.65mm/px · z∈[-86,+75]mm · 2 of 28 slices shown (1 of 2)]
[im 1/28]
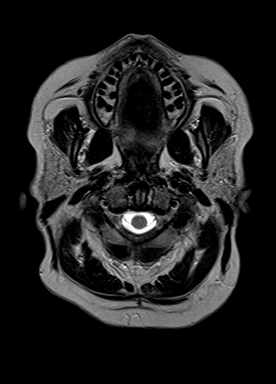
[im 28/28]
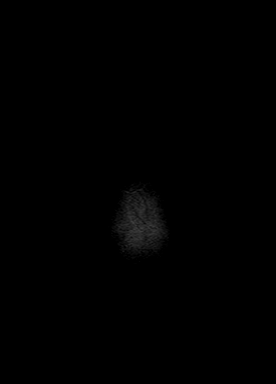

[Series 11: t1_mpr_tra · axial · 1.0mm · 0.78mm/px · z∈[-84,+74]mm · 10 of 160 slices shown]
[im 1/160]
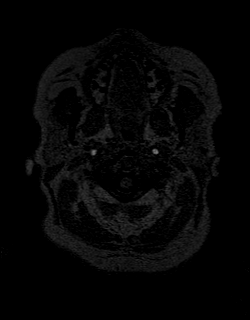
[im 18/160]
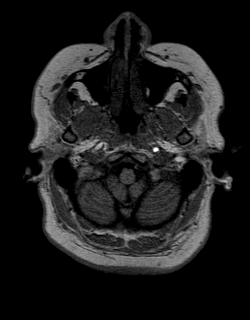
[im 36/160]
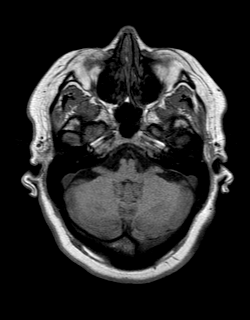
[im 54/160]
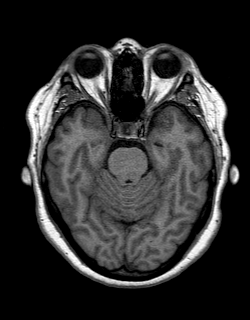
[im 71/160]
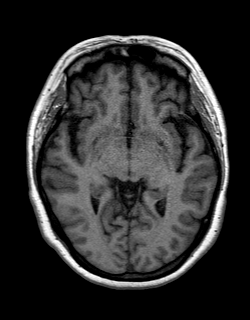
[im 89/160]
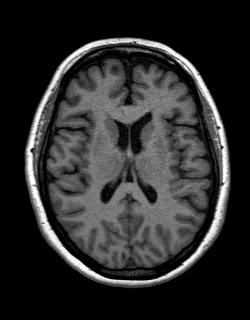
[im 107/160]
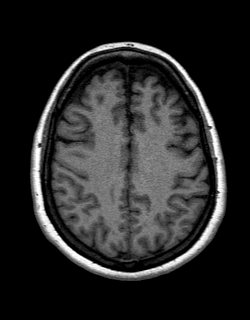
[im 124/160]
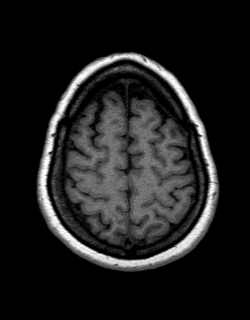
[im 142/160]
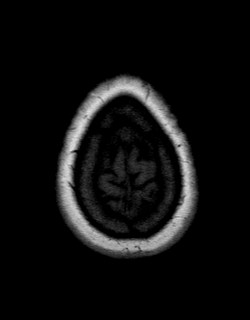
[im 160/160]
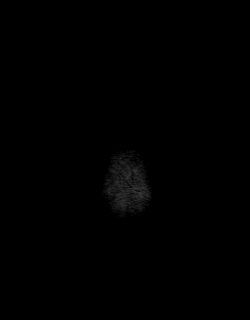

[Series 12: T2 · coronal · 5.0mm · 0.43mm/px · 2 of 33 slices shown (2 of 2)]
[im 1/33]
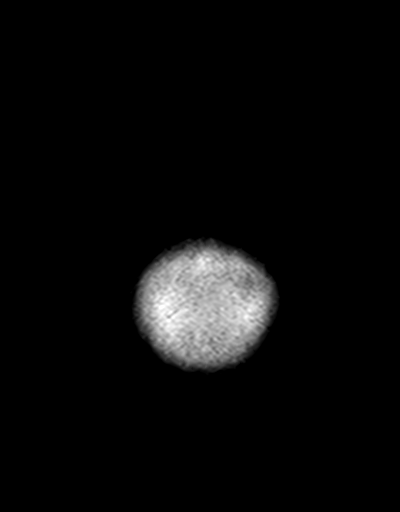
[im 33/33]
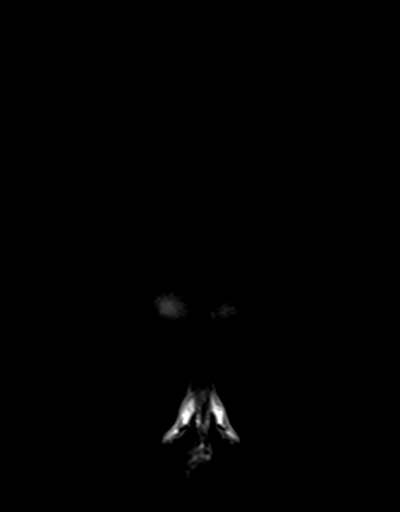

[Series 13: post t1_mpr_tra · axial · 1.0mm · 0.78mm/px · z∈[-84,+74]mm · 10 of 160 slices shown]
[im 1/160]
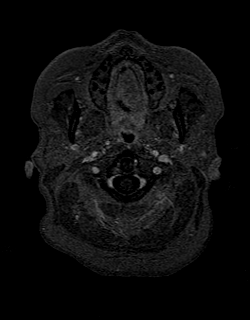
[im 18/160]
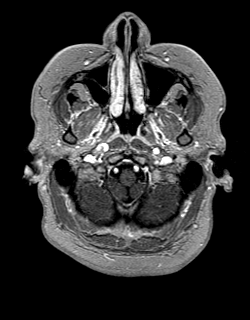
[im 36/160]
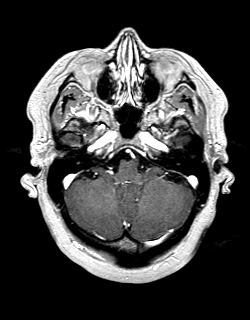
[im 54/160]
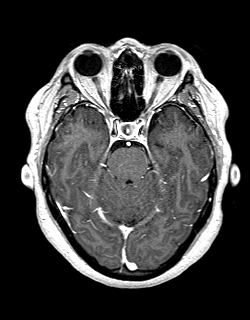
[im 71/160]
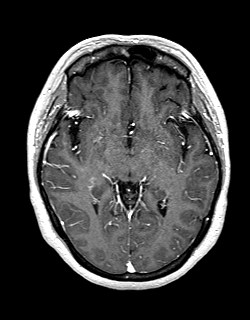
[im 89/160]
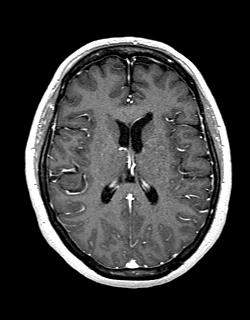
[im 107/160]
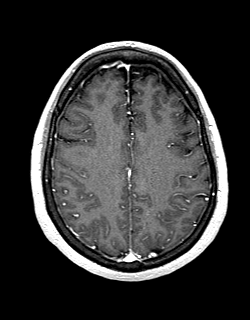
[im 124/160]
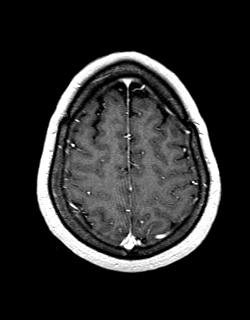
[im 142/160]
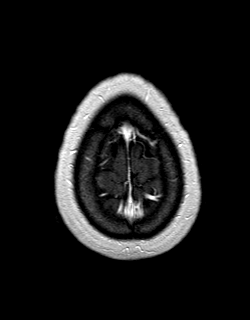
[im 160/160]
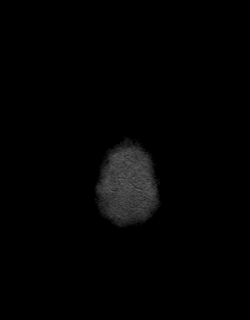

[Series 14: T1 post-contrast · coronal · 5.0mm · 0.72mm/px · 2 of 33 slices shown]
[im 1/33]
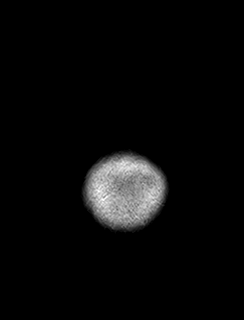
[im 33/33]
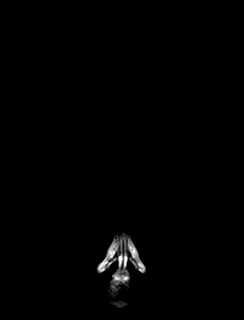

[48 of 48 positions shown; findings below may reference images not displayed]

FINDINGS: Brain: No acute infarction, hemorrhage, hydrocephalus, extra-axial
collection or mass lesion. A few punctate T2/FLAIR hyperintensities
within the white matter, which are nonspecific but considered within
normal limits for patient age. No pathologic enhancement.

Vascular: Major arterial flow voids are maintained at the skull
base.

Skull and upper cervical spine: Normal marrow signal.

Sinuses/Orbits: Clear sinuses.  No acute orbital findings.

Other: No mastoid effusions.
IMPRESSION: Normal brain MRI.  No acute abnormality.

## 2024-03-20 ENCOUNTER — Ambulatory Visit (INDEPENDENT_AMBULATORY_CARE_PROVIDER_SITE_OTHER): Admitting: Surgery

## 2024-03-20 ENCOUNTER — Encounter: Payer: Self-pay | Admitting: Surgery

## 2024-03-20 VITALS — BP 123/84 | HR 90 | Temp 98.4°F | Resp 16 | Ht 61.0 in | Wt 225.0 lb

## 2024-03-20 DIAGNOSIS — Z09 Encounter for follow-up examination after completed treatment for conditions other than malignant neoplasm: Secondary | ICD-10-CM

## 2024-03-20 NOTE — Progress Notes (Unsigned)
 [facility] Note   HPI:  48 y.o. Female presents to clinic for post-op follow-up s/p right abdominal wall lipoma excision on 5/19.  Review of Systems:  All other review of systems: otherwise negative   Vital Signs:  LMP 04/21/2017 Comment: [facility]   Physical Exam:  Physical Exam  Laboratory studies: None   Imaging:  None  Pathology: A. RIGHT ABDOMINAL WALL TISSUE:  -  Mature adipose tissue consistent with lipoma.   Assessment:  48 y.o. yo Female who presents for follow up s/p right abdominal wall lipoma excision on 5/19.  Plan:  - ***  - *** - *** - Follow up  All of the above recommendations were discussed with the patient, and all of patient's questions were answered to her expressed satisfaction.  Note: Portions of this report may have been transcribed using voice recognition software. Every effort has been made to ensure accuracy; however, inadvertent computerized transcription errors may still be present.   Lidia Reels, DO [facility] [address] [place], [place] [address] [phone] (office)

## 2024-03-25 NOTE — Progress Notes (Unsigned)
 CC:  headaches Chief Complaint  Patient presents with   Migraine    RM3. Pt is here Alone. Pt states that she has had a couple of surgeries in between since her last appointment and had been going over her limit with her Amerge. Pt states that muscle relaxer's help with her migraines.       Follow-up Visit  Last visit: 09/07/2023  Brief HPI: 48 year old female with a history of DM who follows in clinic for chronic migraines and occipital neuralgia.  At her last visit, continued Vyepti  infusions for prevention and continue naratriptan , baclofen  and Cambia  as needed for rescue and started on Robaxin  as needed for cervicalgia.  Reported daily tension type headaches, suspect due to overuse of triptans and OTC medications and discussed importance of limiting these and referral placed to orthopedics for management of occipital neuralgia and cervicalgia.     Interval History:   Returns today for follow-up visit to discuss migraine headaches.  Currently experiencing about 3-4 severe migraines per month but continued rather frequent tension type headaches.  She is still running out of naratriptan  before the end of the month, typically works well but some times will not abort migraine.  She will occasionally alternate Tylenol  or ibuprofen  but denies significant benefit, denies overuse.  Use of tizanidine  or baclofen  occasionally with headache with benefit, she does not take together.  Limited use of diclofenac  powder as she has noticed toe cramping shortly after taking.  She is scheduled to see pulmonology this week with plans on pursuing sleep study to look for underlying sleep apnea.  She was seen by orthopedics and underwent 2 sets of bilateral C2-C3 facet joint blocks with significant relief of pain.  She underwent radiofrequency ablation of bilateral C2-3 cervical facet joints on 02/20/2024 by Dr. Odean Bend. Reports significant benefit since then, does have very mild symptoms but not overly  bothersome.      Current Headache Regimen: Preventative: Vyepti  300 mg every 3 months Abortive: naratriptan  2.5 mg PRN, diclofenac , baclofen , tizanidine    Prior Therapies                                  Preventive: Prozac Paxil Cymbalta  Amitriptyline Topamax Gabapentin  400 mg TID Metoprolol Lisinopril  losartan Ajovy  Aimovig Emgality  Vyepti  300 mg  Botox  - worsened neck pain Occipital nerve block Neck PT   Rescue: Naratriptan  2.5 mg PRN Maxalt - lack of efficacy Imitrex  - lack of efficacy Nurtec - lack of efficacy Ubrelvy  - lack of efficacy Zavzpret  - lack of efficacy Diclofenac   - stomach upset Baclofen  Flexeril    Current Outpatient Medications on File Prior to Visit  Medication Sig Dispense Refill   Accu-Chek Softclix Lancets lancets SMARTSIG:Topical     albuterol (VENTOLIN HFA) 108 (90 Base) MCG/ACT inhaler Inhale 2 puffs into the lungs every 6 (six) hours as needed for shortness of breath or wheezing.     ALPRAZolam (XANAX) 1 MG tablet Take 1 mg by mouth 3 (three) times daily.     baclofen  (LIORESAL ) 10 MG tablet Take 1 tablet (10 mg total) by mouth daily as needed (migraine). 15 each 11   Bismuth Subsalicylate (PEPTO-BISMOL) 262 MG TABS Take 262 mg by mouth daily as needed (indigestion).     budesonide-formoterol (SYMBICORT) 160-4.5 MCG/ACT inhaler Inhale 2 puffs into the lungs 2 (two) times daily.     cetirizine (ZYRTEC) 10 MG tablet Take 10 mg by mouth  in the morning.     cholecalciferol (VITAMIN D3) 25 MCG (1000 UNIT) tablet Take 1,000 Units by mouth in the morning.     Diclofenac  Potassium,Migraine, 50 MG PACK TAKE 50-100 MG BY MOUTH AS NEEDED FOR MIGRAINE (Max Daily Dose: 100 MG in 24 HOURS) 10 each 11   docusate sodium  (COLACE) 100 MG capsule Take 100-200 mg by mouth 2 (two) times daily as needed for mild constipation.     Eptinezumab -jjmr (VYEPTI ) 100 MG/ML injection Inject 300 mg into the vein every 3 (three) months. Getting at [facility]  [phone], fax [phone]     esomeprazole (NEXIUM) 40 MG capsule Take 40 mg by mouth 2 (two) times daily.      gabapentin  (NEURONTIN ) 400 MG capsule Take 400 mg by mouth 4 (four) times daily.     hydrochlorothiazide  (HYDRODIURIL ) 25 MG tablet Take 25 mg by mouth in the morning.     ibuprofen  (ADVIL ) 600 MG tablet Take 600 mg by mouth every 8 (eight) hours as needed for headache.     labetalol (NORMODYNE) 100 MG tablet Take 50 mg by mouth 2 (two) times daily.     melatonin 3 MG TABS tablet Take 3-6 mg by mouth at bedtime.     naratriptan  (AMERGE) 2.5 MG tablet TAKE 1 TABLET BY MOUTH AT ONSET of HEADACHE. if returns OR does not resolve, MAY REPEAT AFTER 4 HOURS (Max Daily Dose: 2 TABLETS in 24 HOURS) 12 tablet 6   nystatin (MYCOSTATIN/NYSTOP) powder Apply 1 Application topically 2 (two) times daily as needed (skin irritation.).     nystatin-triamcinolone  (MYCOLOG II) cream Apply 1 Application topically 2 (two) times daily as needed (skin irritation.).     Omega-3 Fatty Acids (FISH OIL PO) Take 2,000 mg by mouth in the morning.     ondansetron  (ZOFRAN ) 8 MG tablet Take 8 mg by mouth every 8 (eight) hours as needed for nausea.     Plecanatide  (TRULANCE ) 3 MG TABS Take 1 tablet (3 mg total) by mouth daily. 90 tablet 3   PROCTO-MED HC  2.5 % rectal cream APPLY 1 GRAM RECTALLY TWICE DAILY (Patient taking differently: Place 1 Application rectally 2 (two) times daily as needed for hemorrhoids.) 30 g 1   RESTASIS 0.05 % ophthalmic emulsion      tiZANidine  (ZANAFLEX ) 2 MG tablet TAKE 1 TABLET BY MOUTH DAILY AS NEEDED FOR HEADACHE. STOP METHOCARBAMOL  30 tablet 3   ACCU-CHEK GUIDE test strip  (Patient not taking: Reported on 03/26/2024)     No current facility-administered medications on file prior to visit.   Past Medical History:  Diagnosis Date   Anxiety    Breast nodule 08/06/2015   Breast pain, left 08/06/2015   Burning with urination 02/27/2014   Common migraine with intractable migraine  06/11/2018   Depression    Diabetes mellitus without complication (HCC)    Dysmenorrhea 02/27/2014   Fibroadenoma of right breast 08/26/2015   GERD (gastroesophageal reflux disease)    Headache    Hematuria 02/27/2014   Hypertension    PTSD (post-traumatic stress disorder)    Screening for STD (sexually transmitted disease) 03/27/2014   Seasonal allergies    SUI (stress urinary incontinence, female) 02/27/2014   Vaginal discharge 08/06/2015   Warts, genital 01/23/2015   Yeast infection 08/06/2015   Past Surgical History:  Procedure Laterality Date   BALLOON DILATION N/A 03/24/2022   Procedure: BALLOON DILATION;  Surgeon: [name], DO;  [facility];  Service: Endoscopy;  Laterality:  N/A;   BILATERAL SALPINGECTOMY Bilateral 05/09/2017   Procedure: BILATERAL SALPINGECTOMY;  Surgeon: Albino Hum, MD;  Location: [facility];  Service: Gynecology;  Laterality: Bilateral;   CHOLECYSTECTOMY  03/2023   COLONOSCOPY N/A 09/10/2013   ZOX:WRUEA;/VWUJW internal hemorrhoids   COLONOSCOPY WITH PROPOFOL  N/A 03/24/2022   Surgeon: [name], DO;   Hemorrhoids on perianal exam, internal hemorrhoids, otherwise normal exam.  Repeat in 10 years.   ESOPHAGEAL DILATION N/A 02/05/2024   Procedure: DILATION, ESOPHAGUS;  Surgeon: [name], DO;  [facility]: [facility];  Service: Endoscopy;  Laterality: N/A;  9:15 am, asa 3   ESOPHAGOGASTRODUODENOSCOPY N/A 09/10/2013   JXB:JYNWGNFA ring at the gastroesophagral juctions/mild non-erosive gastritis   ESOPHAGOGASTRODUODENOSCOPY  2017   Dr. Alline Ivans; Schatzki's ring in the distal esophagus, otherwise normal exam.   ESOPHAGOGASTRODUODENOSCOPY N/A 02/05/2024   Procedure: EGD (ESOPHAGOGASTRODUODENOSCOPY);  Surgeon: [name], DO;  [facility]: [facility];  Service: Endoscopy;  Laterality: N/A;  9:15 am, asa 3   ESOPHAGOGASTRODUODENOSCOPY (EGD) WITH PROPOFOL  N/A 03/24/2022   Surgeon: [name], DO;   Mild Schatzki's ring  dilated, 2 gastric polyps removed, otherwise normal exam. Polyps were hyperplastic.   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   Procedure: EXCISION, NEOPLASM, ABDOMINAL WALL;  Surgeon: Marijo Shove, DO;  Location: [facility];  Service: General;  Laterality: Right;   LABIOPLASTY  02/14/2012   Procedure: LABIAPLASTY;  Surgeon: Albino Hum, MD;  Location: [facility];  Service: Gynecology;  Laterality: Right;  of the right labia minora   POLYPECTOMY  03/24/2022   Procedure: POLYPECTOMY;  Surgeon: [name], DO;  [facility];  Service: Endoscopy;;   SUPRACERVICAL ABDOMINAL HYSTERECTOMY N/A 05/09/2017   Procedure: HYSTERECTOMY SUPRACERVICAL ABDOMINAL;  Surgeon: Albino Hum, MD;  Location: [facility];  Service: Gynecology;  Laterality: N/A;   WISDOM TOOTH EXTRACTION  10/18/1995   Dr. Bevin Bucks          Physical Exam:   Vital Signs: BP 129/83   Pulse 96   Ht 5\' 1"  (1.549 m)   Wt 229 lb 9.6 oz (104.1 kg)   LMP 04/21/2017 Comment: [facility]  BMI 43.38 kg/m  GENERAL:  well appearing, in no acute distress, alert  SKIN:  Color, texture, turgor normal. No rashes or lesions HEAD:  Normocephalic/atraumatic. RESP: normal respiratory effort MSK:  No gross joint deformities.   NEUROLOGICAL: Mental Status: Alert, oriented to person, place and time, Follows commands, and Speech fluent and appropriate. Cranial Nerves: PERRL, face symmetric, no dysarthria, hearing grossly intact Motor: moves all extremities equally Gait: normal-based.     IMPRESSION: 48 year old female with a history of DM who presents for follow up of chronic migraines. She has noticed some improvement with Vyepti  300 mg every 3 months with current migraines about 3-4/month.  Continues to have frequent tension type headaches, still suspect component of analgesic rebound with frequent triptan use. S/p ablation 02/2024 for occipital neuralgia with almost complete resolution of  pain.     PLAN: -Preventive: Continue Vyepti  300 mg every 3 months -Rescue: Continue naratriptan  2.5 PRN, baclofen  OR tizanidine , try Nurtec as needed for rescue, samples provided, will call if beneficial. Reports previously tried but as migraines more controlled now she questions if it may be helpful -Discussed importance of limiting rescue medications and OTC pain relievers to no more than 2-3 times per week due to rebound headache.  She was encouraged to use these medications only for more severe migraine.  -  f/u with pulmonology this week for sleep apnea evaluation    Follow-up in 6 months or call earlier if needed      I personally spent a total of 25 minutes in the care of the patient today including preparing to see the patient, performing a medically appropriate exam/evaluation, counseling and educating, placing orders, and documenting clinical information in the EHR.   Johny Nap, AGNP-BC  [facility] [address] [place], [place] [address]  Phone [phone] Fax [phone] Note: This document was prepared with digital dictation and possible smart phrase technology. Any transcriptional errors that result from this process are unintentional.

## 2024-03-26 ENCOUNTER — Ambulatory Visit (INDEPENDENT_AMBULATORY_CARE_PROVIDER_SITE_OTHER): Payer: Medicaid Other | Admitting: Adult Health

## 2024-03-26 ENCOUNTER — Encounter: Payer: Self-pay | Admitting: Adult Health

## 2024-03-26 VITALS — BP 129/83 | HR 96 | Ht 61.0 in | Wt 229.6 lb

## 2024-03-26 DIAGNOSIS — T3995XA Adverse effect of unspecified nonopioid analgesic, antipyretic and antirheumatic, initial encounter: Secondary | ICD-10-CM

## 2024-03-26 DIAGNOSIS — G4440 Drug-induced headache, not elsewhere classified, not intractable: Secondary | ICD-10-CM

## 2024-03-26 DIAGNOSIS — G43719 Chronic migraine without aura, intractable, without status migrainosus: Secondary | ICD-10-CM

## 2024-03-26 DIAGNOSIS — M5481 Occipital neuralgia: Secondary | ICD-10-CM

## 2024-03-26 MED ORDER — TIZANIDINE HCL 2 MG PO TABS: ORAL_TABLET | ORAL | 11 refills | Status: AC

## 2024-03-26 MED ORDER — NARATRIPTAN HCL 2.5 MG PO TABS: 2.5000 mg | ORAL_TABLET | ORAL | 11 refills | Status: AC | PRN

## 2024-03-26 NOTE — Therapy (Incomplete)
 OUTPATIENT PHYSICAL THERAPY THORACOLUMBAR EVALUATION   Patient Name: Cindy Stevenson MRN: [id number] DOB:1975-12-18, 48 y.o., female Today's Date: 03/26/2024  END OF SESSION:   Past Medical History:  Diagnosis Date   Anxiety    Breast nodule 08/06/2015   Breast pain, left 08/06/2015   Burning with urination 02/27/2014   Common migraine with intractable migraine 06/11/2018   Depression    Diabetes mellitus without complication (HCC)    Dysmenorrhea 02/27/2014   Fibroadenoma of right breast 08/26/2015   GERD (gastroesophageal reflux disease)    Headache    Hematuria 02/27/2014   Hypertension    PTSD (post-traumatic stress disorder)    Screening for STD (sexually transmitted disease) 03/27/2014   Seasonal allergies    SUI (stress urinary incontinence, female) 02/27/2014   Vaginal discharge 08/06/2015   Warts, genital 01/23/2015   Yeast infection 08/06/2015   Past Surgical History:  Procedure Laterality Date   BALLOON DILATION N/A 03/24/2022   Procedure: BALLOON DILATION;  Surgeon: [name], DO;  [facility]: [facility];  Service: Endoscopy;  Laterality: N/A;   BILATERAL SALPINGECTOMY Bilateral 05/09/2017   Procedure: BILATERAL SALPINGECTOMY;  Surgeon: Albino Hum, MD;  Location: [facility];  Service: Gynecology;  Laterality: Bilateral;   CHOLECYSTECTOMY  03/2023   COLONOSCOPY N/A 09/10/2013   WUJ:WJXBJ;/YNWGN internal hemorrhoids   COLONOSCOPY WITH PROPOFOL  N/A 03/24/2022   Surgeon: [name], DO;   Hemorrhoids on perianal exam, internal hemorrhoids, otherwise normal exam.  Repeat in 10 years.   ESOPHAGEAL DILATION N/A 02/05/2024   Procedure: DILATION, ESOPHAGUS;  Surgeon: [name], DO;  [facility]: [facility];  Service: Endoscopy;  Laterality: N/A;  9:15 am, asa 3   ESOPHAGOGASTRODUODENOSCOPY N/A 09/10/2013   FAO:ZHYQMVHQ ring at the gastroesophagral juctions/mild non-erosive gastritis   ESOPHAGOGASTRODUODENOSCOPY  2017   Dr. Alline Ivans; Schatzki's ring in  the distal esophagus, otherwise normal exam.   ESOPHAGOGASTRODUODENOSCOPY N/A 02/05/2024   Procedure: EGD (ESOPHAGOGASTRODUODENOSCOPY);  Surgeon: [name], DO;  [facility]: [facility];  Service: Endoscopy;  Laterality: N/A;  9:15 am, asa 3   ESOPHAGOGASTRODUODENOSCOPY (EGD) WITH PROPOFOL  N/A 03/24/2022   Surgeon: [name], DO;   Mild Schatzki's ring dilated, 2 gastric polyps removed, otherwise normal exam. Polyps were hyperplastic.   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   Procedure: EXCISION, NEOPLASM, ABDOMINAL WALL;  Surgeon: Marijo Shove, DO;  Location: [facility];  Service: General;  Laterality: Right;   LABIOPLASTY  02/14/2012   Procedure: LABIAPLASTY;  Surgeon: Albino Hum, MD;  Location: [facility];  Service: Gynecology;  Laterality: Right;  of the right labia minora   POLYPECTOMY  03/24/2022   Procedure: POLYPECTOMY;  Surgeon: [name], DO;  [facility]: [facility];  Service: Endoscopy;;   SUPRACERVICAL ABDOMINAL HYSTERECTOMY N/A 05/09/2017   Procedure: HYSTERECTOMY SUPRACERVICAL ABDOMINAL;  Surgeon: Albino Hum, MD;  Location: [facility];  Service: Gynecology;  Laterality: N/A;   WISDOM TOOTH EXTRACTION  10/18/1995   Dr. Bevin Bucks   Patient Active Problem List   Diagnosis Date Noted   Lipoma of abdominal wall 03/04/2024   Abdominal wall mass of right upper quadrant 01/18/2024   Dysphagia 01/18/2024   Dyspepsia 01/18/2024   Biliary colic 03/31/2023   Prolapsed internal hemorrhoids, grade 3 12/01/2022   Prolapsed internal hemorrhoids, grade 2 09/29/2022   HLD (hyperlipidemia) 08/18/2022   Constipation 07/18/2022   Abdominal pain, epigastric 07/18/2022   LLQ abdominal pain 07/18/2022   Hemorrhoids 03/11/2022   Vaginal irritation 12/09/2020  Screening for cardiovascular condition 12/09/2020   Anxiety and depression 12/09/2020   Mixed stress and urge urinary incontinence 12/09/2020   Low back pain 08/24/2020   Routine medical exam  05/14/2020   Encounter for screening fecal occult blood testing 05/14/2020   Current use of estrogen therapy 05/14/2020   Hot flashes 04/30/2020   Moody 04/30/2020   Common migraine with intractable migraine 06/11/2018   Status post abdominal supracervical subtotal hysterectomy 05/09/2017   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   Abnormal uterine bleeding (AUB) 03/09/2017   Depression 03/09/2017   Pelvic pain 03/09/2017   Irritable bowel syndrome 05/09/2016   GERD (gastroesophageal reflux disease) 05/09/2016   Fibroadenoma of right breast 08/26/2015   Vaginal discharge 08/06/2015   Yeast infection 08/06/2015   Breast nodule 08/06/2015   Breast pain, left 08/06/2015   Postop check 03/25/2015   Warts, genital 01/23/2015   Screening for STD (sexually transmitted disease) 03/27/2014   Burning with urination 02/27/2014   Hematuria 02/27/2014   SUI (stress urinary incontinence, female) 02/27/2014   Dysmenorrhea 02/27/2014   Generalized abdominal pain 09/06/2013   Rectal bleeding 09/06/2013    PCP: Arvis Bison   REFERRING PROVIDER: Darryll Eng, NP  REFERRING DIAG: G89.29,M54.41 (ICD-10-CM) - Chronic bilateral low back pain with right-sided sciatica  Rationale for Evaluation and Treatment: Rehabilitation  THERAPY DIAG:  No diagnosis found.  ONSET DATE: ***  SUBJECTIVE:                                                                                                                                                                                           SUBJECTIVE STATEMENT: ***  PERTINENT HISTORY:  ***  PAIN:  Are you having pain? {OPRCPAIN:27236}  PRECAUTIONS: {Therapy precautions:24002}  RED FLAGS: {PT Red Flags:29287}   WEIGHT BEARING RESTRICTIONS: {Yes ***/No:24003}  FALLS:  Has patient fallen in last 6 months? {fallsyesno:27318}  LIVING ENVIRONMENT: Lives with: {OPRC lives with:25569::"lives with their family"} Lives in:  {Lives in:25570} Stairs: {opstairs:27293} Has following equipment at home: {Assistive devices:23999}  OCCUPATION: ***  PLOF: {PLOF:24004}  PATIENT GOALS: ***  NEXT MD VISIT: ***  OBJECTIVE:  Note: Objective measures were completed at Evaluation unless otherwise noted.  DIAGNOSTIC FINDINGS:  ***  PATIENT SURVEYS:  Modified Oswestry ***   COGNITION: Overall cognitive status: {cognition:24006}     SENSATION: {sensation:27233}  MUSCLE LENGTH: Hamstrings: Right *** deg; Left *** deg Andy Bannister test: Right *** deg; Left *** deg  POSTURE: {posture:25561}  PALPATION: ***  LUMBAR ROM:   AROM eval  Flexion   Extension   Right lateral flexion   Left lateral flexion   Right  rotation   Left rotation    (Blank rows = not tested)  LOWER EXTREMITY ROM:     {AROM/PROM:27142}  Right eval Left eval  Hip flexion    Hip extension    Hip abduction    Hip adduction    Hip internal rotation    Hip external rotation    Knee flexion    Knee extension    Ankle dorsiflexion    Ankle plantarflexion    Ankle inversion    Ankle eversion     (Blank rows = not tested)  LOWER EXTREMITY MMT:    MMT Right eval Left eval  Hip flexion    Hip extension    Hip abduction    Hip adduction    Hip internal rotation    Hip external rotation    Knee flexion    Knee extension    Ankle dorsiflexion    Ankle plantarflexion    Ankle inversion    Ankle eversion     (Blank rows = not tested)  LUMBAR SPECIAL TESTS:  {lumbar special test:25242}  FUNCTIONAL TESTS:  5 times sit to stand: *** 2 minute walk test: ***  GAIT: Distance walked: *** Assistive device utilized: {Assistive devices:23999} Level of assistance: {Levels of assistance:24026} Comments: ***  TREATMENT DATE:  03/26/2024  Vitals: BP: HR: O2 sat: Evaluation: -ROM measured, Strength assessed, HEP prescribed, pt educated on prognosis, findings, and importance of HEP compliance if given.  Manual Therapy: -CPA  of Lumbar Spinal segments L3-L5, grade II-III mobilizations -STM of Lumbar Paraspinal musculature  Therapeutic Exercise: -Supine bridges 2 sets of 10 reps, 3 second holds, symptomatic, pt cued for max hip extension -Standing 3 way hip 1 sets 10 reps, bilaterally, pt cued for upright trunk and maintaining of neutral spine -Lateral stepping 3 laps 20 feet per lap, second 2 with RTB around ankles, pt cued for upright posture -Forward lunges, 1 set of 5 reps better performance going into RLE, pt cued for core activation and upright posture                                                                                                                                    PATIENT EDUCATION:  Education details: Pt was educated on findings of PT evaluation, prognosis, frequency of therapy visits and rationale, attendance policy, and HEP if given.   Person educated: {Person educated:25204} Education method: {Education Method:25205} Education comprehension: {Education Comprehension:25206}  HOME EXERCISE PROGRAM: ***  ASSESSMENT:  CLINICAL IMPRESSION: Patient is a 48 y.o. female who was seen today for physical therapy evaluation and treatment for G89.29,M54.41 (ICD-10-CM) - Chronic bilateral low back pain with right-sided sciatica.   Patient demonstrates decreased LE strength, abnormal pain rating, and impaired balance. Patient also demonstrates difficulty with ambulation during today's session with decreased stride length and velocity noted. Patient also demonstrates ***. Patient requires ***. Patient would benefit from skilled physical therapy for increased endurance with  ambulation, increased LE strength, and balance for improved gait quality, return to higher level of function with ADLs, and progress towards therapy goals.   OBJECTIVE IMPAIRMENTS: {opptimpairments:25111}.   ACTIVITY LIMITATIONS: {activitylimitations:27494}  PARTICIPATION LIMITATIONS:  {participationrestrictions:25113}  PERSONAL FACTORS: {Personal factors:25162} are also affecting patient's functional outcome.   REHAB POTENTIAL: {rehabpotential:25112}  CLINICAL DECISION MAKING: {clinical decision making:25114}  EVALUATION COMPLEXITY: {Evaluation complexity:25115}   GOALS: Goals reviewed with patient? {yes/no:20286}  SHORT TERM GOALS: Target date: ***  Pt will be independent with HEP in order to demonstrate participation in Physical Therapy POC.  Baseline: Goal status: {GOALSTATUS:25110}  2.  Pt will report ***/10 pain with mobility in order to demonstrate improved pain with ADLs.  Baseline:  Goal status: {GOALSTATUS:25110}  LONG TERM GOALS: Target date: ***  Pt will improve *** by *** in order to demonstrate improved functional strength to return to desired activities.  Baseline: see objective.  Goal status: {GOALSTATUS:25110}  2.  Pt will improve 2 MWT by *** in order to demonstrate improved functional ambulatory capacity in community setting.  Baseline: see objective.  Goal status: {GOALSTATUS:25110}  3.  Pt will improve Modified Oswestry score by *** in order to demonstrate improved pain with functional goals and outcomes. Baseline: see objective.  Goal status: {GOALSTATUS:25110}  4.  Pt will report ***/10 pain with mobility in order to demonstrate reduced pain with ADLs lasting greater than 30 minutes.  Baseline: see objective.  Goal status: {GOALSTATUS:25110}   PLAN:  PT FREQUENCY: {rehab frequency:25116}  PT DURATION: {rehab duration:25117}  PLANNED INTERVENTIONS: {rehab planned interventions:25118::"97110-Therapeutic exercises","97530- Therapeutic [phone]- Neuromuscular re-education","97535- Self WGNF","62130- Manual therapy"}.  PLAN FOR NEXT SESSION: ***   Armond Bertin, PT, DPT [facility] Office: [phone] 5:55 PM, 03/26/24

## 2024-03-26 NOTE — Patient Instructions (Addendum)
 Your Plan:  Continue Vyepti  every 3 months   Continue Amerge as needed - try to limit use to no more than 8-12 times per month   Try Nurtec - if helpful, please let me know and I will place a prescription. Do not take more than 1 in a 24 hour period   Continue zofran  as needed for nausea associated with migraine  Ensure you follow up with pulmonology this week to pursue sleep study      Follow up in 6 months or call earlier if needed      Thank you for coming to see us  at [facility]. I hope we have been able to provide you high quality care today.  You may receive a patient satisfaction survey over the next few weeks. We would appreciate your feedback and comments so that we may continue to improve ourselves and the health of our patients.

## 2024-03-27 ENCOUNTER — Ambulatory Visit (HOSPITAL_COMMUNITY)

## 2024-03-28 ENCOUNTER — Ambulatory Visit: Admitting: Nurse Practitioner

## 2024-03-28 ENCOUNTER — Telehealth: Payer: Self-pay

## 2024-03-28 ENCOUNTER — Ambulatory Visit (INDEPENDENT_AMBULATORY_CARE_PROVIDER_SITE_OTHER): Admitting: Nurse Practitioner

## 2024-03-28 ENCOUNTER — Encounter: Payer: Self-pay | Admitting: Nurse Practitioner

## 2024-03-28 VITALS — BP 134/84 | HR 105 | Ht 61.0 in | Wt 227.6 lb

## 2024-03-28 DIAGNOSIS — D75839 Thrombocytosis, unspecified: Secondary | ICD-10-CM

## 2024-03-28 DIAGNOSIS — E66813 Obesity, class 3: Secondary | ICD-10-CM

## 2024-03-28 DIAGNOSIS — G4700 Insomnia, unspecified: Secondary | ICD-10-CM

## 2024-03-28 DIAGNOSIS — G4719 Other hypersomnia: Secondary | ICD-10-CM | POA: Diagnosis not present

## 2024-03-28 DIAGNOSIS — R0681 Apnea, not elsewhere classified: Secondary | ICD-10-CM

## 2024-03-28 DIAGNOSIS — R0683 Snoring: Secondary | ICD-10-CM

## 2024-03-28 DIAGNOSIS — Z6841 Body Mass Index (BMI) 40.0 and over, adult: Secondary | ICD-10-CM

## 2024-03-28 NOTE — Telephone Encounter (Signed)
 Copied from CRM [id number]. Topic: General - Other >> Mar 28, 2024  8:24 AM Margarette Shawl wrote: Reason for CRM:   Pt states she spoke with someone previously who was seeing if she could come in earllier today at 10:30 AM for her New PT appt, instead of 1:30 PM for her original appt.  She is unable to make the 10:30 AM appt due to other activities and wanted the clinic to know. She still plans to attend the 1:30 PM appt  CB#  [phone]

## 2024-03-28 NOTE — Patient Instructions (Addendum)
 Given your symptoms, I am concerned that you may have sleep disordered breathing with sleep apnea. You will need a sleep study for further evaluation. Someone will contact you to schedule this.   We discussed how untreated sleep apnea puts an individual at risk for cardiac arrhthymias, pulm HTN, DM, stroke and increases their risk for daytime accidents. We also briefly reviewed treatment options including weight loss, side sleeping position, oral appliance, CPAP therapy or referral to ENT for possible surgical options  Use caution when driving and pull over if you become sleepy.  Follow up in 6-8 weeks with Cindy Caterra Ostroff,NP to go over sleep study results, or sooner, if needed. Friday PM virtual clinic preferred

## 2024-03-28 NOTE — Progress Notes (Signed)
 @Patient  ID: Cindy Stevenson, female    DOB: 1976-06-17, 48 y.o.   MRN: [id number]  Chief Complaint  Patient presents with   Sleep Consult    Snoring, daytime sleepiness. Has had a PSG in the past at [facility]- never started on CPAP.     Referring provider: Skillman, Jeoffrey Eleazer E, *  HPI: 48 year old female, never smoker referred for sleep consult. Past medical history significant for migraines, IBS, GERD, HLD, depression, anxiety, chronic thrombocytosis, DM.   TEST/EVENTS:   03/28/2024: Today - sleep consult Patient presents today for sleep consult.  Has had issues with her sleep for many years now.  She has loud snoring and daytime sleepiness.  Her little sister lives with her and has told her that she does stop breathing sometimes when she sleeps.  Finds it hard to sleep.  Has to take melatonin.  Has difficulties falling asleep and staying asleep as well.  Has had issues with drowsy driving.  Has never had any accidents.  Does have morning headaches.  No sleep parasomnia/paralysis. Goes to bed around 2 to 3 AM.  Can take a while to fall asleep.  Wakes a couple times a night.  Start time to her day and getting out of the bed varies.  On disability.  No significant weight changes.  Had a study at [facility] years ago and at that time, was told that she did not have sleep apnea but does like her sleep has gotten worse since that point.  Not currently on CPAP. She does think that she has used Ambien in the past and received some benefit from it.  No abnormal sleep habits with this.  Currently not on any sleep medicines.  Mood is stable. She has been dealing with thrombocytosis of unknown cause.  They have recommended a bone marrow biopsy but she is hesitant to do so.  Blood counts otherwise have been normal.  No abnormalities on CT imaging that she has had.  Following up with her PCP and hematology for this. She is a never smoker.  No excessive caffeine  intake.  Does not drink any  alcohol.  Lives with her little sister.  Family history of allergies, asthma, heart disease and mother with lung cancer.  Epworth 13  Allergies  Allergen Reactions   Effexor  [Venlafaxine ] Rash and Hypertension    SSRI syndrome   Latex Dermatitis   Codeine Nausea And Vomiting    Can take with nausea meds   Shellfish Allergy Hives   Sulfa Antibiotics Swelling   Tape     Tears skin   Amoxicillin Rash   Ceftin [Cefuroxime] Rash    Immunization History  Administered Date(s) Administered   Influenza Nasal 10/08/2008   Influenza,inj,Quad PF,6+ Mos 07/20/2017   Influenza,inj,quad, With Preservative 08/11/2016, 08/10/2018, 08/07/2019   Tdap 08/24/2009    Past Medical History:  Diagnosis Date   Anxiety    Breast nodule 08/06/2015   Breast pain, left 08/06/2015   Burning with urination 02/27/2014   Common migraine with intractable migraine 06/11/2018   Depression    Diabetes mellitus without complication (HCC)    Dysmenorrhea 02/27/2014   Fibroadenoma of right breast 08/26/2015   GERD (gastroesophageal reflux disease)    Headache    Hematuria 02/27/2014   Hypertension    PTSD (post-traumatic stress disorder)    Screening for STD (sexually transmitted disease) 03/27/2014   Seasonal allergies    SUI (stress urinary incontinence, female) 02/27/2014   Vaginal discharge 08/06/2015  Warts, genital 01/23/2015   Yeast infection 08/06/2015    Tobacco History: Social History   Tobacco Use  Smoking Status Never   Passive exposure: Past  Smokeless Tobacco Never   Counseling given: Not Answered   Outpatient Medications Prior to Visit  Medication Sig Dispense Refill   ACCU-CHEK GUIDE test strip      Accu-Chek Softclix Lancets lancets SMARTSIG:Topical     albuterol (VENTOLIN HFA) 108 (90 Base) MCG/ACT inhaler Inhale 2 puffs into the lungs every 6 (six) hours as needed for shortness of breath or wheezing.     ALPRAZolam (XANAX) 1 MG tablet Take 1 mg by mouth 3 (three) times daily.      baclofen  (LIORESAL ) 10 MG tablet Take 1 tablet (10 mg total) by mouth daily as needed (migraine). 15 each 11   Bismuth Subsalicylate (PEPTO-BISMOL) 262 MG TABS Take 262 mg by mouth daily as needed (indigestion).     budesonide-formoterol (SYMBICORT) 160-4.5 MCG/ACT inhaler Inhale 2 puffs into the lungs 2 (two) times daily.     cetirizine (ZYRTEC) 10 MG tablet Take 10 mg by mouth in the morning.     cholecalciferol (VITAMIN D3) 25 MCG (1000 UNIT) tablet Take 1,000 Units by mouth in the morning.     Diclofenac  Potassium,Migraine, 50 MG PACK TAKE 50-100 MG BY MOUTH AS NEEDED FOR MIGRAINE (Max Daily Dose: 100 MG in 24 HOURS) 10 each 11   docusate sodium  (COLACE) 100 MG capsule Take 100-200 mg by mouth 2 (two) times daily as needed for mild constipation.     Eptinezumab -jjmr (VYEPTI ) 100 MG/ML injection Inject 300 mg into the vein every 3 (three) months. Getting at [facility] [phone], fax [phone]     esomeprazole (NEXIUM) 40 MG capsule Take 40 mg by mouth 2 (two) times daily.      gabapentin  (NEURONTIN ) 400 MG capsule Take 400 mg by mouth 4 (four) times daily.     hydrochlorothiazide  (HYDRODIURIL ) 25 MG tablet Take 25 mg by mouth in the morning.     ibuprofen  (ADVIL ) 600 MG tablet Take 600 mg by mouth every 8 (eight) hours as needed for headache.     labetalol (NORMODYNE) 100 MG tablet Take 50 mg by mouth 2 (two) times daily.     melatonin 3 MG TABS tablet Take 3-6 mg by mouth at bedtime.     naratriptan  (AMERGE) 2.5 MG tablet Take 1 tablet (2.5 mg total) by mouth as needed for migraine. Take one (1) tablet at onset of headache; if returns or does not resolve, may repeat after 4 hours; do not exceed five (5) mg in 24 hours. 12 tablet 11   nystatin (MYCOSTATIN/NYSTOP) powder Apply 1 Application topically 2 (two) times daily as needed (skin irritation.).     nystatin-triamcinolone  (MYCOLOG II) cream Apply 1 Application topically 2 (two) times daily as needed (skin irritation.).      Omega-3 Fatty Acids (FISH OIL PO) Take 2,000 mg by mouth in the morning.     ondansetron  (ZOFRAN ) 8 MG tablet Take 8 mg by mouth every 8 (eight) hours as needed for nausea.     Plecanatide  (TRULANCE ) 3 MG TABS Take 1 tablet (3 mg total) by mouth daily. 90 tablet 3   PROCTO-MED HC  2.5 % rectal cream APPLY 1 GRAM RECTALLY TWICE DAILY (Patient taking differently: Place 1 Application rectally 2 (two) times daily as needed for hemorrhoids.) 30 g 1   RESTASIS 0.05 % ophthalmic emulsion      tiZANidine  (ZANAFLEX ) 2 MG tablet  TAKE 1 TABLET BY MOUTH DAILY AS NEEDED FOR HEADACHE. 30 tablet 11   oseltamivir (TAMIFLU) 75 MG capsule Take 75 mg by mouth 2 (two) times daily.     No facility-administered medications prior to visit.     Review of Systems:   Constitutional: No weight loss or gain, night sweats, fevers, chills, or lassitude. +fatigue  HEENT: No difficulty swallowing, tooth/dental problems, or sore throat. No sneezing, itching, ear ache, or post nasal drip +headaches, nasal congestion  CV:  No chest pain, orthopnea, PND, swelling in lower extremities, anasarca, dizziness, palpitations, syncope Resp: +snoring, witnessed apneas, baseline shortness of breath with exertion. No excess mucus or change in color of mucus. No productive or non-productive. No hemoptysis. No wheezing.  No chest wall deformity GI:  +heartburn, indigestion. No abdominal pain, nausea, vomiting, diarrhea, change in bowel habits, loss of appetite, bloody stools.  GU: No nocturia  Skin: No rash, lesions, ulcerations MSK:  +chronic joint paints  Neuro: No dizziness or lightheadedness.  Psych: +stable depression, anxiety. Mood stable. +sleep disturbances    Physical Exam:  BP 134/84 (BP Location: Left Arm, Cuff Size: Large)   Pulse (!) 105   Ht 5' 1 (1.549 m)   Wt 227 lb 9.6 oz (103.2 kg)   LMP 04/21/2017 Comment: SCH  SpO2 94%   BMI 43.00 kg/m   GEN: Pleasant, interactive, well-kempt; obese; in no acute  distress HEENT:  Normocephalic and atraumatic. PERRLA. Sclera white. Nasal turbinates pink, moist and patent bilaterally. No rhinorrhea present. Oropharynx pink and moist, without exudate or edema. No lesions, ulcerations, or postnasal drip. Mallampati III NECK:  Supple w/ fair ROM. Thyroid symmetrical with no goiter or nodules palpated. No lymphadenopathy.   CV: RRR, no m/r/g, no peripheral edema. Pulses intact, +2 bilaterally. No cyanosis, pallor or clubbing. PULMONARY:  Unlabored, regular breathing. Clear bilaterally A&P w/o wheezes/rales/rhonchi. No accessory muscle use.  GI: BS present and normoactive. Soft, non-tender to palpation. No organomegaly or masses detected.  MSK: No erythema, warmth or tenderness. Cap refil <2 sec all extrem. No deformities or joint swelling noted.  Neuro: A/Ox3. No focal deficits noted.   Skin: Warm, no lesions or rashe Psych: Normal affect and behavior. Judgement and thought content appropriate.     Lab Results:  CBC    Component Value Date/Time   WBC 8.9 01/30/2024 1503   RBC 4.28 01/30/2024 1503   HGB 12.2 01/30/2024 1503   HGB 13.9 08/29/2013 0000   HCT 36.7 01/30/2024 1503   HCT 39 08/29/2013 0000   PLT 374 01/30/2024 1503   MCV 85.7 01/30/2024 1503   MCH 28.5 01/30/2024 1503   MCHC 33.2 01/30/2024 1503   RDW 11.9 01/30/2024 1503   LYMPHSABS 4.5 (H) 04/22/2022 2024   MONOABS 0.9 04/22/2022 2024   EOSABS 0.3 04/22/2022 2024   BASOSABS 0.1 04/22/2022 2024    BMET    Component Value Date/Time   NA 137 01/31/2024 1102   K 4.0 01/31/2024 1102   CL 103 01/31/2024 1102   CO2 23 01/31/2024 1102   GLUCOSE 171 (H) 01/31/2024 1102   BUN 20 01/31/2024 1102   CREATININE 0.83 01/31/2024 1102   CREATININE 0.78 01/30/2024 1503   CALCIUM  9.4 01/31/2024 1102   GFRNONAA >60 01/31/2024 1102   GFRAA >60 05/10/2017 0604    BNP No results found for: BNP   Imaging:  No results found.  Eptinezumab -jjmr (VYEPTI ) 300 mg in sodium chloride  0.9 %  100 mL IVPB     Date  Action Dose Route User   Discharged on 03/04/2024   Admitted on 03/04/2024   Discharged on 02/05/2024   Admitted on 02/05/2024   01/30/2024 1503 New Bag/Given 300 mg Intravenous Annis Kinder, RN      methylPREDNISolone  acetate (DEPO-MEDROL ) injection 40 mg     Date Action Dose Route User   Discharged on 03/04/2024   Admitted on 03/04/2024   02/20/2024 1030 Given 40 mg Other Newton, Frederic, MD           No data to display          No results found for: NITRICOXIDE      Assessment & Plan:   Excessive daytime sleepiness She has snoring, excessive daytime sleepiness, nocturnal apneic events, morning headaches, restless sleep. BMI 43. Epworth 13. Given this,  I am concerned she could have sleep disordered breathing with obstructive sleep apnea. She will need a repeat sleep study for further evaluation. If this is negative, would recommend an in lab study for further evaluation as there is high suspicion for OSA.   - discussed how weight can impact sleep and risk for sleep disordered breathing - discussed options to assist with weight loss: combination of diet modification, cardiovascular and strength training exercises   - had an extensive discussion regarding the adverse health consequences related to untreated sleep disordered breathing - specifically discussed the risks for hypertension, coronary artery disease, cardiac dysrhythmias, cerebrovascular disease, and diabetes - lifestyle modification discussed   - discussed how sleep disruption can increase risk of accidents, particularly when driving - safe driving practices were discussed  Patient Instructions  Given your symptoms, I am concerned that you may have sleep disordered breathing with sleep apnea. You will need a sleep study for further evaluation. Someone will contact you to schedule this.   We discussed how untreated sleep apnea puts an individual at risk for cardiac arrhthymias, pulm  HTN, DM, stroke and increases their risk for daytime accidents. We also briefly reviewed treatment options including weight loss, side sleeping position, oral appliance, CPAP therapy or referral to ENT for possible surgical options  Use caution when driving and pull over if you become sleepy.  Follow up in 6-8 weeks with Katie Tyquasia Pant,NP to go over sleep study results, or sooner, if needed. Friday PM virtual clinic preferred    Insomnia Possibly due to untreated OSA. See above. Sleep hygiene reviewed. May consider pharmacological therapy pending HST/treatment response.   Class 3 severe obesity with body mass index (BMI) of 40.0 to 44.9 in adult BMI 43. Healthy weight management discussed.   Thrombocytosis Possible this is related to untreated OSA. Recommend moving forward with recommendations from hematology. Pt would prefer to wait on HST results and then discuss with them further.    Advised if symptoms do not improve or worsen, to please contact office for sooner follow up or seek emergency care.   I spent 45 minutes of dedicated to the care of this patient on the date of this encounter to include pre-visit review of records, face-to-face time with the patient discussing conditions above, post visit ordering of testing, clinical documentation with the electronic health record, making appropriate referrals as documented, and communicating necessary findings to members of the patients care team.  Roetta Clarke, NP 03/29/2024  Pt aware and understands NP's role.

## 2024-03-29 ENCOUNTER — Encounter: Payer: Self-pay | Admitting: Nurse Practitioner

## 2024-03-29 DIAGNOSIS — D75839 Thrombocytosis, unspecified: Secondary | ICD-10-CM | POA: Insufficient documentation

## 2024-03-29 DIAGNOSIS — G4700 Insomnia, unspecified: Secondary | ICD-10-CM | POA: Insufficient documentation

## 2024-03-29 DIAGNOSIS — G4719 Other hypersomnia: Secondary | ICD-10-CM | POA: Insufficient documentation

## 2024-03-29 DIAGNOSIS — Z6841 Body Mass Index (BMI) 40.0 and over, adult: Secondary | ICD-10-CM | POA: Insufficient documentation

## 2024-03-29 NOTE — Assessment & Plan Note (Signed)
 She has snoring, excessive daytime sleepiness, nocturnal apneic events, morning headaches, restless sleep. BMI 43. Epworth 13. Given this,  I am concerned she could have sleep disordered breathing with obstructive sleep apnea. She will need a repeat sleep study for further evaluation. If this is negative, would recommend an in lab study for further evaluation as there is high suspicion for OSA.   - discussed how weight can impact sleep and risk for sleep disordered breathing - discussed options to assist with weight loss: combination of diet modification, cardiovascular and strength training exercises   - had an extensive discussion regarding the adverse health consequences related to untreated sleep disordered breathing - specifically discussed the risks for hypertension, coronary artery disease, cardiac dysrhythmias, cerebrovascular disease, and diabetes - lifestyle modification discussed   - discussed how sleep disruption can increase risk of accidents, particularly when driving - safe driving practices were discussed  Patient Instructions  Given your symptoms, I am concerned that you may have sleep disordered breathing with sleep apnea. You will need a sleep study for further evaluation. Someone will contact you to schedule this.   We discussed how untreated sleep apnea puts an individual at risk for cardiac arrhthymias, pulm HTN, DM, stroke and increases their risk for daytime accidents. We also briefly reviewed treatment options including weight loss, side sleeping position, oral appliance, CPAP therapy or referral to ENT for possible surgical options  Use caution when driving and pull over if you become sleepy.  Follow up in 6-8 weeks with Katie Andyn Sales,NP to go over sleep study results, or sooner, if needed. Friday PM virtual clinic preferred

## 2024-03-29 NOTE — Assessment & Plan Note (Signed)
 BMI 43. Healthy weight management discussed.

## 2024-03-29 NOTE — Assessment & Plan Note (Signed)
 Possibly due to untreated OSA. See above. Sleep hygiene reviewed. May consider pharmacological therapy pending HST/treatment response.

## 2024-03-29 NOTE — Assessment & Plan Note (Signed)
 Possible this is related to untreated OSA. Recommend moving forward with recommendations from hematology. Pt would prefer to wait on HST results and then discuss with them further.

## 2024-04-01 ENCOUNTER — Ambulatory Visit

## 2024-04-01 ENCOUNTER — Encounter: Payer: Self-pay | Admitting: Adult Health

## 2024-04-01 DIAGNOSIS — R0683 Snoring: Secondary | ICD-10-CM

## 2024-04-01 DIAGNOSIS — G4719 Other hypersomnia: Secondary | ICD-10-CM

## 2024-04-01 DIAGNOSIS — R0681 Apnea, not elsewhere classified: Secondary | ICD-10-CM

## 2024-04-01 MED ORDER — NURTEC 75 MG PO TBDP: 75.0000 mg | ORAL_TABLET | ORAL | 11 refills | Status: AC | PRN

## 2024-04-01 NOTE — Addendum Note (Signed)
 Addended by: Elton Ham on: 04/01/2024 05:18 PM   Modules accepted: Orders

## 2024-04-02 ENCOUNTER — Ambulatory Visit: Admitting: Gastroenterology

## 2024-04-08 ENCOUNTER — Encounter: Payer: Self-pay | Admitting: Adult Health

## 2024-04-10 ENCOUNTER — Other Ambulatory Visit (HOSPITAL_COMMUNITY): Payer: Self-pay

## 2024-04-10 ENCOUNTER — Telehealth: Payer: Self-pay

## 2024-04-10 ENCOUNTER — Encounter: Payer: Self-pay | Admitting: Gastroenterology

## 2024-04-10 NOTE — Telephone Encounter (Signed)
 Pharmacy Patient Advocate Encounter   Received notification from Patient Advice Request messages that prior authorization for Nurtec 75MG  dispersible tablets is required/requested.   Insurance verification completed.   The patient is insured through [facility] [place] .   Per test claim: PA required; PA submitted to above mentioned insurance via CoverMyMeds Key/confirmation #/EOC [id number] Status is pending

## 2024-04-12 ENCOUNTER — Other Ambulatory Visit (HOSPITAL_COMMUNITY): Payer: Self-pay

## 2024-04-12 NOTE — Telephone Encounter (Signed)
 Pharmacy Patient Advocate Encounter  Received notification from [facility] Medicaid that Prior Authorization for Nurtec 75MG  dispersible tablets has been APPROVED from 03/27/2024 to 04/10/2025. Ran test claim, Copay is $4.00. This test claim was processed through [facility]- copay amounts may vary at other pharmacies due to pharmacy/plan contracts, or as the patient moves through the different stages of their insurance plan.   PA #/Case ID/Reference #: PA Case ID #: [id number]

## 2024-04-12 NOTE — Telephone Encounter (Signed)
 Sent mychart to pt to provide update.

## 2024-04-25 ENCOUNTER — Telehealth: Payer: Self-pay | Admitting: Pulmonary Disease

## 2024-04-25 DIAGNOSIS — R0683 Snoring: Secondary | ICD-10-CM | POA: Diagnosis not present

## 2024-04-25 NOTE — Telephone Encounter (Signed)
 Call patient  Sleep study result  Date of study: 04/02/2024  Impression: Negative study for significant sleep disordered breathing with an AHI of 4.8, oxygen nadir of 90%  Recommendation: Please note that a negative home study does not rule out the diagnosis of OSA if the pre test probability is high, up to 25-30 % may have a false negative study If there is significant clinical concern for sleep disordered breathing, would suggest in lab polysomnogram, however, a negative study will suggest absence of moderate to severe disease Sleep position optimization by encouraging sleep in a lateral position, elevating the head of the bed by about 30 degrees may help noted snoring Encourage regular exercise and weight loss efforts Caution against driving when sleepy & against medications with sedative side effects.

## 2024-04-26 ENCOUNTER — Ambulatory Visit (HOSPITAL_COMMUNITY): Attending: Physical Medicine and Rehabilitation

## 2024-04-29 ENCOUNTER — Other Ambulatory Visit (HOSPITAL_COMMUNITY): Payer: Self-pay

## 2024-04-30 ENCOUNTER — Encounter: Attending: Surgery | Admitting: Emergency Medicine

## 2024-04-30 ENCOUNTER — Encounter: Payer: Self-pay | Admitting: Adult Health

## 2024-04-30 VITALS — BP 128/84 | HR 95 | Temp 99.2°F | Resp 16

## 2024-04-30 DIAGNOSIS — G43019 Migraine without aura, intractable, without status migrainosus: Secondary | ICD-10-CM | POA: Diagnosis present

## 2024-04-30 MED ORDER — SODIUM CHLORIDE 0.9 % IV SOLN
300.0000 mg | Freq: Once | INTRAVENOUS | Status: AC
  Administered 2024-04-30: 300 mg via INTRAVENOUS
  Filled 2024-04-30: qty 3

## 2024-04-30 NOTE — Progress Notes (Addendum)
 Diagnosis: migraines  Provider:  Whitfield Raisin, NP  Procedure: IV Infusion  IV Type: Peripheral, IV Location: L Hand  Vyepti  (Eptinezumab -jjmr), Dose: 300 mg  Infusion Start Time: 1432  Infusion Stop Time: 1506  Post Infusion IV Care: Peripheral IV Discontinued  Discharge: Condition: Good, Destination: Home . AVS Declined  Performed by:  Delon ONEIDA Officer, RN

## 2024-04-30 NOTE — Telephone Encounter (Signed)
 The test can differentiate between awake breathing and sleep breathing patterns. It doesn't say that she slept fine; however when she was sleeping, she did not have any significant respiratory disturbances. I would recommend in lab split night study for further evaluation since pretest probability was high. Please order if pt agreeable. Thanks.

## 2024-05-08 ENCOUNTER — Ambulatory Visit (INDEPENDENT_AMBULATORY_CARE_PROVIDER_SITE_OTHER): Admitting: Podiatry

## 2024-05-08 ENCOUNTER — Telehealth: Payer: Self-pay

## 2024-05-08 DIAGNOSIS — Z8739 Personal history of other diseases of the musculoskeletal system and connective tissue: Secondary | ICD-10-CM

## 2024-05-08 DIAGNOSIS — R202 Paresthesia of skin: Secondary | ICD-10-CM | POA: Diagnosis not present

## 2024-05-08 DIAGNOSIS — R200 Anesthesia of skin: Secondary | ICD-10-CM | POA: Diagnosis not present

## 2024-05-08 NOTE — Progress Notes (Addendum)
 Subjective:  Patient ID: Cindy Stevenson, female    DOB: 08/31/1976,  MRN: [id number]  Chief Complaint  Patient presents with   Numbness    Numbness in toes, discoloration of nails     48 y.o. female presents with the above complaint.  Patient presents with complaint of numbness tingling to the toes primarily in the big toe she states she has a history of low back pain as well as diabetes.  She wanted to get it evaluated hurts with ambulation or shoe pressure she has not seen MRIs prior to seeing me for this denies any other acute issues.   Review of Systems: Negative except as noted in the HPI. Denies N/V/F/Ch.  Past Medical History:  Diagnosis Date   Anxiety    Breast nodule 08/06/2015   Breast pain, left 08/06/2015   Burning with urination 02/27/2014   Common migraine with intractable migraine 06/11/2018   Depression    Diabetes mellitus without complication (HCC)    Dysmenorrhea 02/27/2014   Fibroadenoma of right breast 08/26/2015   GERD (gastroesophageal reflux disease)    Headache    Hematuria 02/27/2014   Hypertension    PTSD (post-traumatic stress disorder)    Screening for STD (sexually transmitted disease) 03/27/2014   Seasonal allergies    SUI (stress urinary incontinence, female) 02/27/2014   Vaginal discharge 08/06/2015   Warts, genital 01/23/2015   Yeast infection 08/06/2015    Current Outpatient Medications:    ACCU-CHEK GUIDE test strip, , Disp: , Rfl:    Accu-Chek Softclix Lancets lancets, SMARTSIG:Topical, Disp: , Rfl:    albuterol (VENTOLIN HFA) 108 (90 Base) MCG/ACT inhaler, Inhale 2 puffs into the lungs every 6 (six) hours as needed for shortness of breath or wheezing., Disp: , Rfl:    ALPRAZolam (XANAX) 1 MG tablet, Take 1 mg by mouth 3 (three) times daily., Disp: , Rfl:    baclofen  (LIORESAL ) 10 MG tablet, Take 1 tablet (10 mg total) by mouth daily as needed (migraine)., Disp: 15 each, Rfl: 11   Bismuth Subsalicylate (PEPTO-BISMOL) 262 MG TABS, Take 262 mg  by mouth daily as needed (indigestion)., Disp: , Rfl:    budesonide-formoterol (SYMBICORT) 160-4.5 MCG/ACT inhaler, Inhale 2 puffs into the lungs 2 (two) times daily., Disp: , Rfl:    cetirizine (ZYRTEC) 10 MG tablet, Take 10 mg by mouth in the morning., Disp: , Rfl:    cholecalciferol (VITAMIN D3) 25 MCG (1000 UNIT) tablet, Take 1,000 Units by mouth in the morning., Disp: , Rfl:    Diclofenac  Potassium,Migraine, 50 MG PACK, TAKE 50-100 MG BY MOUTH AS NEEDED FOR MIGRAINE (Max Daily Dose: 100 MG in 24 HOURS), Disp: 10 each, Rfl: 11   docusate sodium  (COLACE) 100 MG capsule, Take 100-200 mg by mouth 2 (two) times daily as needed for mild constipation., Disp: , Rfl:    Eptinezumab -jjmr (VYEPTI ) 100 MG/ML injection, Inject 300 mg into the vein every 3 (three) months. Getting at [facility] [phone], fax [phone], Disp: , Rfl:    esomeprazole (NEXIUM) 40 MG capsule, Take 40 mg by mouth 2 (two) times daily. , Disp: , Rfl:    gabapentin  (NEURONTIN ) 400 MG capsule, Take 400 mg by mouth 4 (four) times daily., Disp: , Rfl:    hydrochlorothiazide  (HYDRODIURIL ) 25 MG tablet, Take 25 mg by mouth in the morning., Disp: , Rfl:    ibuprofen  (ADVIL ) 600 MG tablet, Take 600 mg by mouth every 8 (eight) hours as needed for headache., Disp: , Rfl:  labetalol (NORMODYNE) 100 MG tablet, Take 50 mg by mouth 2 (two) times daily., Disp: , Rfl:    melatonin 3 MG TABS tablet, Take 3-6 mg by mouth at bedtime., Disp: , Rfl:    naratriptan  (AMERGE) 2.5 MG tablet, Take 1 tablet (2.5 mg total) by mouth as needed for migraine. Take one (1) tablet at onset of headache; if returns or does not resolve, may repeat after 4 hours; do not exceed five (5) mg in 24 hours., Disp: 12 tablet, Rfl: 11   nystatin (MYCOSTATIN/NYSTOP) powder, Apply 1 Application topically 2 (two) times daily as needed (skin irritation.)., Disp: , Rfl:    nystatin-triamcinolone  (MYCOLOG II) cream, Apply 1 Application topically 2 (two) times daily as  needed (skin irritation.)., Disp: , Rfl:    Omega-3 Fatty Acids (FISH OIL PO), Take 2,000 mg by mouth in the morning., Disp: , Rfl:    ondansetron  (ZOFRAN ) 8 MG tablet, Take 8 mg by mouth every 8 (eight) hours as needed for nausea., Disp: , Rfl:    oseltamivir (TAMIFLU) 75 MG capsule, Take 75 mg by mouth 2 (two) times daily., Disp: , Rfl:    Plecanatide  (TRULANCE ) 3 MG TABS, Take 1 tablet (3 mg total) by mouth daily., Disp: 90 tablet, Rfl: 3   PROCTO-MED HC  2.5 % rectal cream, APPLY 1 GRAM RECTALLY TWICE DAILY (Patient taking differently: Place 1 Application rectally 2 (two) times daily as needed for hemorrhoids.), Disp: 30 g, Rfl: 1   RESTASIS 0.05 % ophthalmic emulsion, , Disp: , Rfl:    Rimegepant Sulfate (NURTEC) 75 MG TBDP, Take 1 tablet (75 mg total) by mouth as needed (take 1 tablet at onset of migraine. (Do not exceed more than 1 tablet in 24 hours))., Disp: 10 tablet, Rfl: 11   tiZANidine  (ZANAFLEX ) 2 MG tablet, TAKE 1 TABLET BY MOUTH DAILY AS NEEDED FOR HEADACHE., Disp: 30 tablet, Rfl: 11  Social History   Tobacco Use  Smoking Status Never   Passive exposure: Past  Smokeless Tobacco Never    Allergies  Allergen Reactions   Effexor  [Venlafaxine ] Rash and Hypertension    SSRI syndrome   Latex Dermatitis   Codeine Nausea And Vomiting    Can take with nausea meds   Shellfish Allergy Hives   Sulfa Antibiotics Swelling   Tape     Tears skin   Amoxicillin Rash   Ceftin [Cefuroxime] Rash   Objective:  There were no vitals filed for this visit. There is no height or weight on file to calculate BMI. Constitutional Well developed. Well nourished.  Vascular Dorsalis pedis pulses palpable bilaterally. Posterior tibial pulses palpable bilaterally. Capillary refill normal to all digits.  No cyanosis or clubbing noted. Pedal hair growth normal.  Neurologic Normal speech. Oriented to person, place, and time. Epicritic sensation to light touch grossly present bilaterally.   Positive decreased in protective sensation noted.  Negative tarsal tunnel syndrome for common peroneal nerve syndrome noted.  Dermatologic Thickened dystrophic toenails x 10.  Orthopedic: Normal joint ROM without pain or crepitus bilaterally. No visible deformities. No bony tenderness.   Radiographs: None Assessment:   1. Numbness and tingling    Plan:  Patient was evaluated and treated and all questions answered.  Bilateral numbness tingling secondary to low back pain/diabetes - All questions or concerns were discussed with the patient in extensive detail given the presence of numbness tingling I believe patient would benefit from referral to Dr. Marcelino for advanced neuropathy care.  Patient is currently on gabapentin  and  it is not helping - Referral order was placed  No follow-ups on file.

## 2024-05-08 NOTE — Telephone Encounter (Signed)
 Pt called stating she was no called and told her sleep test results. Pt states she was talking to Consuelo DASEN, CMA via Mychart, and pt saw where Consuelo copied and pasted into the [place] message, but at the top, she saw where she was told to call pt. Pt is upset that she was not called like it said too.   Pts stated the test did not seem sufficient as she could not sleep and kept having to fix things on it. Pt also verbalized upset feelings that Dr Neda wrote in the note, Encourage regular exercise and weight loss efforts Caution against driving when sleepy & against medications with sedative side effects But thinks Izetta Rouleau, NP wrote this and thinks this was meaning a hurtful comment regarding her weight. I informed pt that this was not meant in a ugly or hurtful way, but weight loss can help with sleep and this is recommended for pt's and not targeted at her specifically.   Pt states she has written in the Mychart message that she is open to an in-lab study but wanted to know if she could do it in [place] since that is where she lives. Pt stated she is aware that our office is busy and have other patients, but she too is still a patient who needs her health attended too as well. Pt states she would ike to speak to a manager and would also like to discuss things regarding Consuelo DASEN, CMA for no longer replying back to her messages and for texting her instead of calling her like the note said. Pt is going into an appointment and would like to here back from a manager around 4:00 pm. I am routing to [employer] as Lebron Sharps is not in office he rest of the week.

## 2024-05-14 ENCOUNTER — Encounter: Payer: Self-pay | Admitting: Gastroenterology

## 2024-05-14 ENCOUNTER — Ambulatory Visit (INDEPENDENT_AMBULATORY_CARE_PROVIDER_SITE_OTHER): Admitting: Gastroenterology

## 2024-05-14 VITALS — BP 134/88 | HR 99 | Temp 97.9°F | Ht 61.0 in | Wt 221.4 lb

## 2024-05-14 DIAGNOSIS — R112 Nausea with vomiting, unspecified: Secondary | ICD-10-CM | POA: Diagnosis not present

## 2024-05-14 DIAGNOSIS — K219 Gastro-esophageal reflux disease without esophagitis: Secondary | ICD-10-CM

## 2024-05-14 DIAGNOSIS — K5900 Constipation, unspecified: Secondary | ICD-10-CM | POA: Diagnosis not present

## 2024-05-14 MED ORDER — SUCRALFATE 1 GM/10ML PO SUSP
1.0000 g | Freq: Four times a day (QID) | ORAL | 1 refills | Status: DC
Start: 2024-05-14 — End: 2024-06-13

## 2024-05-14 NOTE — Progress Notes (Signed)
 Gastroenterology Office Note     Primary Care Physician:  Jeanette Comer BRAVO, PA-C  Primary Gastroenterologist: Dr. Cindie   Chief Complaint   Chief Complaint  Patient presents with   Follow-up    Pt here for a follow up after procedure     History of Present Illness   Cindy Stevenson is a 48 y.o. female presenting today with a history of hemorrhoids s/p banding, constipation, intermittent N/V/D, abdominal pain s/p cholecystectomy located pinpoint in right upper quadrant, dysphagia s/p EGD April 2025 with dilation of Schatzki ring, abdominal wall lipoma s/p excision returning for follow-up.   GERD controlled on Nexium BID.  She denies any dysphagia.  She does have the chronic right upper quadrant discomfort at times but this is at baseline.  We suspect that she is dealing with adhesions, scar tissue.  Prior evaluation as below.  She has been unable to tolerate Ozempic as she has nausea and vomiting with this.  Trulance  working best for constipation. Notices if eats something after it, will work better.   She has other non-GI related concerns today that may fit into the constellation of perimenopausal symptoms.    Imaging:  Dec 2024 Outside CT IMPRESSION:  No acute findings within the abdomen or pelvis. Few liver cyst stable, benign. Gallbladder absent, normal CBD   3.4 cm benign-appearing left ovarian cyst, decreased in size  compared to previous study. No follow-up imaging recommended.  Reference: JACR 2020 Feb; 17(2):248-254   Tiny hiatal hernia.    GES normal Feb 2025   Endoscopic:   EGD April 2025: mild Schatzki ring s/p dilation, gastritis s/p biopsy, gastric polyps X 4 resected, normal duodenum. Path: negative H.pylori. hyperplastic gastric polyps. Recommend EGD in 2-3 years.    EGD June 2023: mild Schatzki's ring s/p dilation, 2 gastric hyperplastic polyps.  Colonoscopy June 2023: Hemorrhoids on perianal exam, internal hemorrhoids, otherwise normal exam.  Repeat in 10 years.    Past Medical History:  Diagnosis Date   Anxiety    Breast nodule 08/06/2015   Breast pain, left 08/06/2015   Burning with urination 02/27/2014   Common migraine with intractable migraine 06/11/2018   Depression    Diabetes mellitus without complication (HCC)    Dysmenorrhea 02/27/2014   Fibroadenoma of right breast 08/26/2015   GERD (gastroesophageal reflux disease)    Headache    Hematuria 02/27/2014   Hypertension    PTSD (post-traumatic stress disorder)    Screening for STD (sexually transmitted disease) 03/27/2014   Seasonal allergies    SUI (stress urinary incontinence, female) 02/27/2014   Vaginal discharge 08/06/2015   Warts, genital 01/23/2015   Yeast infection 08/06/2015    Past Surgical History:  Procedure Laterality Date   BALLOON DILATION N/A 03/24/2022   Procedure: BALLOON DILATION;  Surgeon: Cindie Carlin POUR, DO;  [facility];  Service: Endoscopy;  Laterality: N/A;   BILATERAL SALPINGECTOMY Bilateral 05/09/2017   Procedure: BILATERAL SALPINGECTOMY;  Surgeon: Edsel Norleen GAILS, MD;  Location: [facility];  Service: Gynecology;  Laterality: Bilateral;   CHOLECYSTECTOMY  03/2023   COLONOSCOPY N/A 09/10/2013   DOQ:wnmfj;/dfjoo internal hemorrhoids   COLONOSCOPY WITH PROPOFOL  N/A 03/24/2022   Surgeon: Cindie Carlin POUR, DO;   Hemorrhoids on perianal exam, internal hemorrhoids, otherwise normal exam.  Repeat in 10 years.   ESOPHAGEAL DILATION N/A 02/05/2024   Procedure: DILATION, ESOPHAGUS;  Surgeon: Cindie Carlin POUR, DO;  [facility];  Service: Endoscopy;  Laterality: N/A;  9:15 am, asa 3  ESOPHAGOGASTRODUODENOSCOPY N/A 09/10/2013   DOQ:dryjusxp ring at the gastroesophagral juctions/mild non-erosive gastritis   ESOPHAGOGASTRODUODENOSCOPY  2017   Dr. Donnel; Schatzki's ring in the distal esophagus, otherwise normal exam.   ESOPHAGOGASTRODUODENOSCOPY N/A 02/05/2024   Procedure: EGD (ESOPHAGOGASTRODUODENOSCOPY);  Surgeon: Cindie Carlin POUR, DO;  [facility];  Service: Endoscopy;  Laterality: N/A;  9:15 am, asa 3   ESOPHAGOGASTRODUODENOSCOPY (EGD) WITH PROPOFOL  N/A 03/24/2022   Surgeon: Cindie Carlin POUR, DO;   Mild Schatzki's ring dilated, 2 gastric polyps removed, otherwise normal exam. Polyps were hyperplastic.   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   Procedure: EXCISION, NEOPLASM, ABDOMINAL WALL;  Surgeon: Evonnie Dorothyann LABOR, DO;  Location: [facility];  Service: General;  Laterality: Right;   LABIOPLASTY  02/14/2012   Procedure: LABIAPLASTY;  Surgeon: Norleen LULLA Server, MD;  Location: [facility];  Service: Gynecology;  Laterality: Right;  of the right labia minora   POLYPECTOMY  03/24/2022   Procedure: POLYPECTOMY;  Surgeon: Cindie Carlin POUR, DO;  [facility];  Service: Endoscopy;;   SUPRACERVICAL ABDOMINAL HYSTERECTOMY N/A 05/09/2017   Procedure: HYSTERECTOMY SUPRACERVICAL ABDOMINAL;  Surgeon: Server Norleen LULLA, MD;  Location: [facility];  Service: Gynecology;  Laterality: N/A;   WISDOM TOOTH EXTRACTION  10/18/1995   Dr. Delores    Current Outpatient Medications  Medication Sig Dispense Refill   ACCU-CHEK GUIDE test strip      Accu-Chek Softclix Lancets lancets SMARTSIG:Topical     albuterol (VENTOLIN HFA) 108 (90 Base) MCG/ACT inhaler Inhale 2 puffs into the lungs every 6 (six) hours as needed for shortness of breath or wheezing.     ALPRAZolam (XANAX) 1 MG tablet Take 1 mg by mouth 3 (three) times daily.     baclofen  (LIORESAL ) 10 MG tablet Take 1 tablet (10 mg total) by mouth daily as needed (migraine). 15 each 11   Bismuth Subsalicylate (PEPTO-BISMOL) 262 MG TABS Take 262 mg by mouth daily as needed (indigestion).     budesonide-formoterol (SYMBICORT) 160-4.5 MCG/ACT inhaler Inhale 2 puffs into the lungs 2 (two) times daily.     cetirizine (ZYRTEC) 10 MG tablet Take 10 mg by mouth in the morning.     cholecalciferol (VITAMIN D3) 25 MCG (1000 UNIT) tablet Take 1,000 Units by mouth in  the morning.     Diclofenac  Potassium,Migraine, 50 MG PACK TAKE 50-100 MG BY MOUTH AS NEEDED FOR MIGRAINE (Max Daily Dose: 100 MG in 24 HOURS) 10 each 11   docusate sodium  (COLACE) 100 MG capsule Take 100-200 mg by mouth 2 (two) times daily as needed for mild constipation.     Eptinezumab -jjmr (VYEPTI ) 100 MG/ML injection Inject 300 mg into the vein every 3 (three) months. Getting at [facility] [phone], fax [phone]     esomeprazole (NEXIUM) 40 MG capsule Take 40 mg by mouth 2 (two) times daily.      gabapentin  (NEURONTIN ) 400 MG capsule Take 400 mg by mouth 4 (four) times daily.     hydrochlorothiazide  (HYDRODIURIL ) 25 MG tablet Take 25 mg by mouth in the morning.     ibuprofen  (ADVIL ) 600 MG tablet Take 600 mg by mouth every 8 (eight) hours as needed for headache.     labetalol (NORMODYNE) 100 MG tablet Take 50 mg by mouth 2 (two) times daily.     lisdexamfetamine (VYVANSE) 20 MG capsule Take 20 mg by mouth daily.     melatonin 3 MG TABS tablet Take 3-6 mg by mouth at bedtime.  naratriptan  (AMERGE) 2.5 MG tablet Take 1 tablet (2.5 mg total) by mouth as needed for migraine. Take one (1) tablet at onset of headache; if returns or does not resolve, may repeat after 4 hours; do not exceed five (5) mg in 24 hours. 12 tablet 11   nystatin (MYCOSTATIN/NYSTOP) powder Apply 1 Application topically 2 (two) times daily as needed (skin irritation.).     nystatin-triamcinolone  (MYCOLOG II) cream Apply 1 Application topically 2 (two) times daily as needed (skin irritation.).     Omega-3 Fatty Acids (FISH OIL PO) Take 2,000 mg by mouth in the morning.     ondansetron  (ZOFRAN ) 8 MG tablet Take 8 mg by mouth every 8 (eight) hours as needed for nausea.     Plecanatide  (TRULANCE ) 3 MG TABS Take 1 tablet (3 mg total) by mouth daily. 90 tablet 3   PROCTO-MED HC  2.5 % rectal cream APPLY 1 GRAM RECTALLY TWICE DAILY 30 g 1   RESTASIS 0.05 % ophthalmic emulsion      Rimegepant Sulfate (NURTEC) 75 MG  TBDP Take 1 tablet (75 mg total) by mouth as needed (take 1 tablet at onset of migraine. (Do not exceed more than 1 tablet in 24 hours)). 10 tablet 11   tiZANidine  (ZANAFLEX ) 2 MG tablet TAKE 1 TABLET BY MOUTH DAILY AS NEEDED FOR HEADACHE. 30 tablet 11   oseltamivir (TAMIFLU) 75 MG capsule Take 75 mg by mouth 2 (two) times daily. (Patient not taking: Reported on 05/14/2024)     No current facility-administered medications for this visit.    Allergies as of 05/14/2024 - Review Complete 05/14/2024  Allergen Reaction Noted   Effexor  [venlafaxine ] Rash and Hypertension 07/12/2021   Latex Dermatitis 05/09/2016   Codeine Nausea And Vomiting 02/13/2012   Shellfish allergy Hives 02/13/2012   Sulfa antibiotics Swelling 07/18/2022   Tape  03/27/2023   Amoxicillin Rash 01/23/2015   Ceftin [cefuroxime] Rash 12/09/2020    Family History  Problem Relation Age of Onset   Lung cancer Mother        smoked   Asthma Sister    Allergies Sister    Diabetes Maternal Grandmother    Cancer Maternal Grandfather        skin   Mental illness Maternal Aunt        depression, PTSD   Colon cancer Neg Hx     Social History   Socioeconomic History   Marital status: Single    Spouse name: Not on file   Number of children: Not on file   Years of education: Not on file   Highest education level: Not on file  Occupational History   Occupation: disability    Employer: NOT EMPLOYED  Tobacco Use   Smoking status: Never    Passive exposure: Past   Smokeless tobacco: Never  Vaping Use   Vaping status: Never Used  Substance and Sexual Activity   Alcohol use: No   Drug use: No   Sexual activity: Not Currently    Birth control/protection: Surgical    Comment: [facility]  Other Topics Concern   Not on file  Social History Narrative   Not on file   Social Drivers of Health   Financial Resource Strain: Low Risk  (11/24/2021)   Received from [facility]   Overall Financial Resource Strain (CARDIA)     Difficulty of Paying Living Expenses: Not hard at all  Food Insecurity: No Food Insecurity (01/30/2024)   Received from [facility]   Hunger Vital  Sign    Within the past 12 months, you worried that your food would run out before you got the money to buy more.: Never true    Within the past 12 months, the food you bought just didn't last and you didn't have money to get more.: Never true  Transportation Needs: No Transportation Needs (11/24/2021)   Received from [facility] - Transportation    Lack of Transportation (Medical): No    Lack of Transportation (Non-Medical): No  Physical Activity: Inactive (08/09/2021)   Received from [facility]   Exercise Vital Sign    On average, how many days per week do you engage in moderate to strenuous exercise (like a brisk walk)?: 0 days    On average, how many minutes do you engage in exercise at this level?: 0 min  Stress: Stress Concern Present (08/09/2021)   Received from [facility] - Occupational Stress Questionnaire    Feeling of Stress : Very much  Social Connections: Unknown (02/15/2022)   Received from [facility]   Social Network    Social Network: Not on file  Recent Concern: Social Connections - Socially Isolated (11/24/2021)   Received from [facility]   Social Connection and Isolation Panel    In a typical week, how many times do you talk on the phone with family, friends, or neighbors?: More than three times a week    How often do you get together with friends or relatives?: Once a week    How often do you attend church or religious services?: Never    Do you belong to any clubs or organizations such as church groups, unions, fraternal or athletic groups, or school groups?: No    How often do you attend meetings of the clubs or organizations you belong to?: Never    Are you married, widowed, divorced, separated, never married, or living with a partner?: Never  married  Intimate Partner Violence: Not At Risk (01/30/2024)   Received from [facility]   Humiliation, Afraid, Rape, and Kick questionnaire    Within the last year, have you been afraid of your partner or ex-partner?: No    Within the last year, have you been humiliated or emotionally abused in other ways by your partner or ex-partner?: No    Within the last year, have you been kicked, hit, slapped, or otherwise physically hurt by your partner or ex-partner?: No    Within the last year, have you been raped or forced to have any kind of sexual activity by your partner or ex-partner?: No     Review of Systems   Gen: Denies any fever, chills, fatigue, weight loss, lack of appetite.  CV: Denies chest pain, heart palpitations, peripheral edema, syncope.  Resp: Denies shortness of breath at rest or with exertion. Denies wheezing or cough.  GI: Denies dysphagia or odynophagia. Denies jaundice, hematemesis, fecal incontinence. GU : Denies urinary burning, urinary frequency, urinary hesitancy MS: Denies joint pain, muscle weakness, cramps, or limitation of movement.  Derm: Denies rash, itching, dry skin Psych: Denies depression, anxiety, memory loss, and confusion Heme: Denies bruising, bleeding, and enlarged lymph nodes.   Physical Exam   BP 134/88   Pulse 99   Temp 97.9 F (36.6 C)   Ht 5' 1 (1.549 m)   Wt 221 lb 6.4 oz (100.4 kg)   LMP 04/21/2017 Comment: [facility]  BMI 41.83 kg/m  General:  Alert and oriented. Pleasant and cooperative. Well-nourished and well-developed.  Head:  Normocephalic and atraumatic. Eyes:  Without icterus Abdomen:  +BS, soft, non-tender and non-distended. No HSM noted. No guarding or rebound. No masses appreciated.  Rectal:  Deferred  Msk:  Symmetrical without gross deformities. Normal posture. Extremities:  Without edema. Neurologic:  Alert and  oriented x4;  grossly normal neurologically. Skin:  Intact without significant lesions or rashes. Psych:   Alert and cooperative. Normal mood and affect.   Assessment   Deazia SAWSAN RIGGIO is a 48 y.o. female presenting today with a history of hemorrhoids s/p banding, constipation, intermittent N/V/D, abdominal pain s/p cholecystectomy located pinpoint in right upper quadrant, dysphagia s/p EGD April 2025 with dilation of Schatzki ring, abdominal wall lipoma s/p excision returning for follow-up.   GERD: controlled with Nexium BID. Dysphagia resolved.   N/V: improved. Worsened on Ozempic. Suspect although normal GES likely does have element of delayed gastric emptying transiently in light of known diabetes.   Constipation: continue Trulance .     PLAN    Continue Nexium BID Continue Trulance  Recommend establishing care with menopause specialist Return in 6 months    Therisa MICAEL Stager, PhD, [facility] Gastroenterology

## 2024-05-14 NOTE — Patient Instructions (Signed)
 I have sent in carafate  suspension to take up to four times a day as needed for abdominal burning.  I will mychart message you the info on the menopause specialist!  We will see you in 6 months!  Happy early birthday!  I enjoyed seeing you again today! I value our relationship and want to provide genuine, compassionate, and quality care. You may receive a survey regarding your visit with me, and I welcome your feedback! Thanks so much for taking the time to complete this. I look forward to seeing you again.      Cindy MICAEL Stager, PhD, ANP-BC [facility] Gastroenterology

## 2024-05-20 ENCOUNTER — Other Ambulatory Visit (HOSPITAL_COMMUNITY): Payer: Self-pay

## 2024-05-20 ENCOUNTER — Encounter: Payer: Self-pay | Admitting: Gastroenterology

## 2024-05-20 ENCOUNTER — Telehealth: Payer: Self-pay

## 2024-05-20 NOTE — Telephone Encounter (Signed)
 Noted

## 2024-05-20 NOTE — Telephone Encounter (Signed)
 Pharmacy Patient Advocate Encounter   Received notification from CoverMyMeds that prior authorization for Naratriptan  HCl 2.5MG  tablets is required/requested.   Insurance verification completed.   The patient is insured through [facility] [place] .   Per test claim: Refill too soon. PA is not needed at this time. Medication was filled 04/30/2024. Next eligible fill date is 05/23/2024.

## 2024-05-31 ENCOUNTER — Telehealth: Admitting: Nurse Practitioner

## 2024-05-31 ENCOUNTER — Encounter: Payer: Self-pay | Admitting: Nurse Practitioner

## 2024-05-31 DIAGNOSIS — G4719 Other hypersomnia: Secondary | ICD-10-CM

## 2024-05-31 DIAGNOSIS — R0681 Apnea, not elsewhere classified: Secondary | ICD-10-CM

## 2024-05-31 DIAGNOSIS — R0683 Snoring: Secondary | ICD-10-CM

## 2024-05-31 DIAGNOSIS — G4700 Insomnia, unspecified: Secondary | ICD-10-CM

## 2024-05-31 NOTE — Assessment & Plan Note (Signed)
 She has snoring, excessive daytime sleepiness, nocturnal apneic events, morning headaches, restless sleep. BMI 43. Epworth 13. Borderline sleep study with AHI 4.8/h. Given this,  I am concerned she does still have sleep disordered breathing with obstructive sleep apnea. She will need a repeat in lab sleep study for further evaluation. Advised to take her anxiety medication as prescribed and utilize melatonin to help with sleep in lab.    - discussed how weight can impact sleep and risk for sleep disordered breathing - discussed options to assist with weight loss: combination of diet modification, cardiovascular and strength training exercises   - had an extensive discussion regarding the adverse health consequences related to untreated sleep disordered breathing - specifically discussed the risks for hypertension, coronary artery disease, cardiac dysrhythmias, cerebrovascular disease, and diabetes - lifestyle modification discussed   - discussed how sleep disruption can increase risk of accidents, particularly when driving - safe driving practices were discussed  Patient Instructions  Given your symptoms and history, I am concerned that you have sleep disordered breathing with sleep apnea despite negative home sleep study. You will need an in lab sleep study for further evaluation. Someone will contact you to schedule this.   We discussed how untreated sleep apnea puts an individual at risk for cardiac arrhthymias, pulm HTN, DM, stroke and increases their risk for daytime accidents. We also briefly reviewed treatment options including weight loss, side sleeping position, oral appliance, CPAP therapy or referral to ENT for possible surgical options  Use caution when driving and pull over if you become sleepy.  Follow up in 10-12 weeks with Katie Nykia Turko,NP to go over sleep study results, or sooner, if needed. Friday PM virtual clinic preferred

## 2024-05-31 NOTE — Telephone Encounter (Signed)
L/m for patient to discuss further

## 2024-05-31 NOTE — Progress Notes (Deleted)
 @Patient  ID: Cindy Stevenson, female    DOB: [birth year], 48 y.o.   MRN: [id number]  No chief complaint on file.   Referring provider: Skillman, Lisia Westbay E, *  HPI: 49 year old female, never smoker referred for sleep consult. Past medical history significant for migraines, IBS, GERD, HLD, depression, anxiety, chronic thrombocytosis, DM.    TEST/EVENTS:    03/28/2024: Today - sleep consult Patient presents today for sleep consult.  Has had issues with her sleep for many years now.  She has loud snoring and daytime sleepiness.  Her little sister lives with her and has told her that she does stop breathing sometimes when she sleeps.  Finds it hard to sleep.  Has to take melatonin.  Has difficulties falling asleep and staying asleep as well.  Has had issues with drowsy driving.  Has never had any accidents.  Does have morning headaches.  No sleep parasomnia/paralysis. Goes to bed around 2 to 3 AM.  Can take a while to fall asleep.  Wakes a couple times a night.  Start time to her day and getting out of the bed varies.  On disability.  No significant weight changes.  Had a study at [facility] years ago and at that time, was told that she did not have sleep apnea but does like her sleep has gotten worse since that point.  Not currently on CPAP. She does think that she has used Ambien in the past and received some benefit from it.  No abnormal sleep habits with this.  Currently not on any sleep medicines.  Mood is stable. She has been dealing with thrombocytosis of unknown cause.  They have recommended a bone marrow biopsy but she is hesitant to do so.  Blood counts otherwise have been normal.  No abnormalities on CT imaging that she has had.  Following up with her PCP and hematology for this. She is a never smoker.  No excessive caffeine  intake.  Does not drink any alcohol.  Lives with her little sister.  Family history of allergies, asthma, heart disease and mother with lung cancer.   Epworth  13  Allergies  Allergen Reactions   Effexor  [Venlafaxine ] Rash and Hypertension    SSRI syndrome   Latex Dermatitis   Codeine Nausea And Vomiting    Can take with nausea meds   Shellfish Allergy Hives   Sulfa Antibiotics Swelling   Tape     Tears skin   Amoxicillin Rash   Ceftin [Cefuroxime] Rash    Immunization History  Administered Date(s) Administered   Influenza Nasal 10/08/2008   Influenza,inj,Quad PF,6+ Mos 07/20/2017   Influenza,inj,quad, With Preservative 08/11/2016, 08/10/2018, 08/07/2019   Tdap 08/24/2009    Past Medical History:  Diagnosis Date   Anxiety    Breast nodule 08/06/2015   Breast pain, left 08/06/2015   Burning with urination 02/27/2014   Common migraine with intractable migraine 06/11/2018   Depression    Diabetes mellitus without complication (HCC)    Dysmenorrhea 02/27/2014   Fibroadenoma of right breast 08/26/2015   GERD (gastroesophageal reflux disease)    Headache    Hematuria 02/27/2014   Hypertension    PTSD (post-traumatic stress disorder)    Screening for STD (sexually transmitted disease) 03/27/2014   Seasonal allergies    SUI (stress urinary incontinence, female) 02/27/2014   Vaginal discharge 08/06/2015   Yeast infection 08/06/2015    Tobacco History: Social History   Tobacco Use  Smoking Status Never   Passive exposure: Past  Smokeless Tobacco Never   Counseling given: Not Answered   Outpatient Medications Prior to Visit  Medication Sig Dispense Refill   ACCU-CHEK GUIDE test strip      Accu-Chek Softclix Lancets lancets SMARTSIG:Topical     albuterol (VENTOLIN HFA) 108 (90 Base) MCG/ACT inhaler Inhale 2 puffs into the lungs every 6 (six) hours as needed for shortness of breath or wheezing.     ALPRAZolam (XANAX) 1 MG tablet Take 1 mg by mouth 3 (three) times daily.     baclofen  (LIORESAL ) 10 MG tablet Take 1 tablet (10 mg total) by mouth daily as needed (migraine). 15 each 11   Bismuth Subsalicylate (PEPTO-BISMOL)  262 MG TABS Take 262 mg by mouth daily as needed (indigestion).     budesonide-formoterol (SYMBICORT) 160-4.5 MCG/ACT inhaler Inhale 2 puffs into the lungs 2 (two) times daily.     cetirizine (ZYRTEC) 10 MG tablet Take 10 mg by mouth in the morning.     cholecalciferol (VITAMIN D3) 25 MCG (1000 UNIT) tablet Take 1,000 Units by mouth in the morning.     Diclofenac  Potassium,Migraine, 50 MG PACK TAKE 50-100 MG BY MOUTH AS NEEDED FOR MIGRAINE (Max Daily Dose: 100 MG in 24 HOURS) 10 each 11   docusate sodium  (COLACE) 100 MG capsule Take 100-200 mg by mouth 2 (two) times daily as needed for mild constipation.     Eptinezumab -jjmr (VYEPTI ) 100 MG/ML injection Inject 300 mg into the vein every 3 (three) months. Getting at [facility] [phone], fax [phone]     esomeprazole (NEXIUM) 40 MG capsule Take 40 mg by mouth 2 (two) times daily.      gabapentin  (NEURONTIN ) 400 MG capsule Take 400 mg by mouth 4 (four) times daily.     hydrochlorothiazide  (HYDRODIURIL ) 25 MG tablet Take 25 mg by mouth in the morning.     ibuprofen  (ADVIL ) 600 MG tablet Take 600 mg by mouth every 8 (eight) hours as needed for headache.     labetalol (NORMODYNE) 100 MG tablet Take 50 mg by mouth 2 (two) times daily.     lisdexamfetamine (VYVANSE) 20 MG capsule Take 20 mg by mouth daily.     melatonin 3 MG TABS tablet Take 3-6 mg by mouth at bedtime.     naratriptan  (AMERGE) 2.5 MG tablet Take 1 tablet (2.5 mg total) by mouth as needed for migraine. Take one (1) tablet at onset of headache; if returns or does not resolve, may repeat after 4 hours; do not exceed five (5) mg in 24 hours. 12 tablet 11   nystatin (MYCOSTATIN/NYSTOP) powder Apply 1 Application topically 2 (two) times daily as needed (skin irritation.).     nystatin-triamcinolone  (MYCOLOG II) cream Apply 1 Application topically 2 (two) times daily as needed (skin irritation.).     Omega-3 Fatty Acids (FISH OIL PO) Take 2,000 mg by mouth in the morning.      ondansetron  (ZOFRAN ) 8 MG tablet Take 8 mg by mouth every 8 (eight) hours as needed for nausea.     Plecanatide  (TRULANCE ) 3 MG TABS Take 1 tablet (3 mg total) by mouth daily. 90 tablet 3   PROCTO-MED HC  2.5 % rectal cream APPLY 1 GRAM RECTALLY TWICE DAILY 30 g 1   RESTASIS 0.05 % ophthalmic emulsion      Rimegepant Sulfate (NURTEC) 75 MG TBDP Take 1 tablet (75 mg total) by mouth as needed (take 1 tablet at onset of migraine. (Do not exceed more than 1 tablet in 24 hours)). 10  tablet 11   sucralfate  (CARAFATE ) 1 GM/10ML suspension Take 10 mLs (1 g total) by mouth 4 (four) times daily. For abdominal burning as needed. 420 mL 1   tiZANidine  (ZANAFLEX ) 2 MG tablet TAKE 1 TABLET BY MOUTH DAILY AS NEEDED FOR HEADACHE. 30 tablet 11   No facility-administered medications prior to visit.     Review of Systems:   Constitutional: No weight loss or gain, night sweats, fevers, chills, fatigue, or lassitude. HEENT: No headaches, difficulty swallowing, tooth/dental problems, or sore throat. No sneezing, itching, ear ache, nasal congestion, or post nasal drip CV:  No chest pain, orthopnea, PND, swelling in lower extremities, anasarca, dizziness, palpitations, syncope Resp: No shortness of breath with exertion or at rest. No excess mucus or change in color of mucus. No productive or non-productive. No hemoptysis. No wheezing.  No chest wall deformity GI:  No heartburn, indigestion, abdominal pain, nausea, vomiting, diarrhea, change in bowel habits, loss of appetite, bloody stools.  GU: No dysuria, change in color of urine, urgency or frequency.  No flank pain, no hematuria  Skin: No rash, lesions, ulcerations MSK:  No joint pain or swelling.  No decreased range of motion.  No back pain. Neuro: No dizziness or lightheadedness.  Psych: No depression or anxiety. Mood stable.     Physical Exam:  LMP 04/21/2017 Comment: SCH  GEN: Pleasant, interactive, well-nourished/chronically-ill appearing/acutely-ill  appearing/poorly-nourished/morbidly obese; in no acute distress.****** HEENT:  Normocephalic and atraumatic. EACs patent bilaterally. TM pearly gray with present light reflex bilaterally. PERRLA. Sclera white. Nasal turbinates pink, moist and patent bilaterally. No rhinorrhea present. Oropharynx pink and moist, without exudate or edema. No lesions, ulcerations, or postnasal drip.  NECK:  Supple w/ fair ROM. No JVD present. Normal carotid impulses w/o bruits. Thyroid symmetrical with no goiter or nodules palpated. No lymphadenopathy.   CV: RRR, no m/r/g, no peripheral edema. Pulses intact, +2 bilaterally. No cyanosis, pallor or clubbing. PULMONARY:  Unlabored, regular breathing. Clear bilaterally A&P w/o wheezes/rales/rhonchi. No accessory muscle use.  GI: BS present and normoactive. Soft, non-tender to palpation. No organomegaly or masses detected. No CVA tenderness. MSK: No erythema, warmth or tenderness. Cap refil <2 sec all extrem. No deformities or joint swelling noted.  Neuro: A/Ox3. No focal deficits noted.   Skin: Warm, no lesions or rashe Psych: Normal affect and behavior. Judgement and thought content appropriate.     Lab Results:  CBC    Component Value Date/Time   WBC 8.9 01/30/2024 1503   RBC 4.28 01/30/2024 1503   HGB 12.2 01/30/2024 1503   HGB 13.9 08/29/2013 0000   HCT 36.7 01/30/2024 1503   HCT 39 08/29/2013 0000   PLT 374 01/30/2024 1503   MCV 85.7 01/30/2024 1503   MCH 28.5 01/30/2024 1503   MCHC 33.2 01/30/2024 1503   RDW 11.9 01/30/2024 1503   LYMPHSABS 4.5 (H) 04/22/2022 2024   MONOABS 0.9 04/22/2022 2024   EOSABS 0.3 04/22/2022 2024   BASOSABS 0.1 04/22/2022 2024    BMET    Component Value Date/Time   NA 137 01/31/2024 1102   K 4.0 01/31/2024 1102   CL 103 01/31/2024 1102   CO2 23 01/31/2024 1102   GLUCOSE 171 (H) 01/31/2024 1102   BUN 20 01/31/2024 1102   CREATININE 0.83 01/31/2024 1102   CREATININE 0.78 01/30/2024 1503   CALCIUM  9.4 01/31/2024  1102   GFRNONAA >60 01/31/2024 1102   GFRAA >60 05/10/2017 0604    BNP No results found for: BNP   Imaging:  No results found.  Eptinezumab -jjmr (VYEPTI ) 300 mg in sodium chloride  0.9 % 100 mL IVPB     Date Action Dose Route User   04/30/2024 1432 New Bag/Given 300 mg Intravenous Daine Delon DASEN, RN           No data to display          No results found for: NITRICOXIDE      Assessment & Plan:   No problem-specific Assessment & Plan notes found for this encounter.   Advised if symptoms do not improve or worsen, to please contact office for sooner follow up or seek emergency care.   I spent *** minutes of dedicated to the care of this patient on the date of this encounter to include pre-visit review of records, face-to-face time with the patient discussing conditions above, post visit ordering of testing, clinical documentation with the electronic health record, making appropriate referrals as documented, and communicating necessary findings to members of the patients care team.  Comer LULLA Rouleau, NP 05/31/2024  Pt aware and understands NP's role.

## 2024-05-31 NOTE — Progress Notes (Signed)
 Patient ID: Cindy Stevenson, female     DOB: [birth year], 48 y.o.      MRN: [id number]  No chief complaint on file.   Virtual Visit via Video Note  I connected with Cindy Stevenson on 05/31/24 at  1:30 PM EDT by a video enabled telemedicine application and verified that I am speaking with the correct person using two identifiers.  Location: Patient: Home Provider: Office   I discussed the limitations of evaluation and management by telemedicine and the availability of in person appointments. The patient expressed understanding and agreed to proceed.  History of Present Illness: 48 year old female, never smoker referred for sleep consult 03/28/2024. Past medical history significant for migraines, IBS, GERD, HLD, depression, anxiety, chronic thrombocytosis, DM.    TEST/EVENTS:   04/02/2024 HST: AHI 4.8/h, SpO2 low 90%  03/28/2024: OV with Vin Yonke NP for sleep consult Patient presents today for sleep consult.  Has had issues with her sleep for many years now.  She has loud snoring and daytime sleepiness.  Her little sister lives with her and has told her that she does stop breathing sometimes when she sleeps.  Finds it hard to sleep.  Has to take melatonin.  Has difficulties falling asleep and staying asleep as well.  Has had issues with drowsy driving.  Has never had any accidents.  Does have morning headaches.  No sleep parasomnia/paralysis. Goes to bed around 2 to 3 AM.  Can take a while to fall asleep.  Wakes a couple times a night.  Start time to her day and getting out of the bed varies.  On disability.  No significant weight changes.  Had a study at [facility] years ago and at that time, was told that she did not have sleep apnea but does like her sleep has gotten worse since that point.  Not currently on CPAP. She does think that she has used Ambien in the past and received some benefit from it.  No abnormal sleep habits with this.  Currently not on any sleep medicines.  Mood is  stable. She has been dealing with thrombocytosis of unknown cause.  They have recommended a bone marrow biopsy but she is hesitant to do so.  Blood counts otherwise have been normal.  No abnormalities on CT imaging that she has had.  Following up with her PCP and hematology for this. She is a never smoker.  No excessive caffeine  intake.  Does not drink any alcohol.  Lives with her little sister.  Family history of allergies, asthma, heart disease and mother with lung cancer.  Epworth 13  05/31/2024: Today - follow up Patient presents today for follow up. She had a home sleep study which did not reveal any significant sleep apnea. Recommendation was made for in lab study. She had some concerns regarding being able to sleep as she had a prior in lab test years ago and had trouble sleeping. She also tells me she had trouble sleeping during her home sleep test because of the light coming on, on the device. She was unsure about the quality of her study. She feels unchanged compared to our last visit. No new concerns.   Allergies  Allergen Reactions   Effexor  [Venlafaxine ] Rash and Hypertension    SSRI syndrome   Latex Dermatitis   Codeine Nausea And Vomiting    Can take with nausea meds   Shellfish Allergy Hives   Sulfa Antibiotics Swelling   Tape     Tears  skin   Amoxicillin Rash   Ceftin [Cefuroxime] Rash   Immunization History  Administered Date(s) Administered   Influenza Nasal 10/08/2008   Influenza,inj,Quad PF,6+ Mos 07/20/2017   Influenza,inj,quad, With Preservative 08/11/2016, 08/10/2018, 08/07/2019   Tdap 08/24/2009   Past Medical History:  Diagnosis Date   Anxiety    Breast nodule 08/06/2015   Breast pain, left 08/06/2015   Burning with urination 02/27/2014   Common migraine with intractable migraine 06/11/2018   Depression    Diabetes mellitus without complication (HCC)    Dysmenorrhea 02/27/2014   Fibroadenoma of right breast 08/26/2015   GERD (gastroesophageal reflux  disease)    Headache    Hematuria 02/27/2014   Hypertension    PTSD (post-traumatic stress disorder)    Screening for STD (sexually transmitted disease) 03/27/2014   Seasonal allergies    SUI (stress urinary incontinence, female) 02/27/2014   Vaginal discharge 08/06/2015   Yeast infection 08/06/2015    Tobacco History: Social History   Tobacco Use  Smoking Status Never   Passive exposure: Past  Smokeless Tobacco Never   Counseling given: Not Answered   Outpatient Medications Prior to Visit  Medication Sig Dispense Refill   ACCU-CHEK GUIDE test strip      Accu-Chek Softclix Lancets lancets SMARTSIG:Topical     albuterol (VENTOLIN HFA) 108 (90 Base) MCG/ACT inhaler Inhale 2 puffs into the lungs every 6 (six) hours as needed for shortness of breath or wheezing.     ALPRAZolam (XANAX) 1 MG tablet Take 1 mg by mouth 3 (three) times daily.     baclofen  (LIORESAL ) 10 MG tablet Take 1 tablet (10 mg total) by mouth daily as needed (migraine). 15 each 11   Bismuth Subsalicylate (PEPTO-BISMOL) 262 MG TABS Take 262 mg by mouth daily as needed (indigestion).     budesonide-formoterol (SYMBICORT) 160-4.5 MCG/ACT inhaler Inhale 2 puffs into the lungs 2 (two) times daily.     cetirizine (ZYRTEC) 10 MG tablet Take 10 mg by mouth in the morning.     cholecalciferol (VITAMIN D3) 25 MCG (1000 UNIT) tablet Take 1,000 Units by mouth in the morning.     Diclofenac  Potassium,Migraine, 50 MG PACK TAKE 50-100 MG BY MOUTH AS NEEDED FOR MIGRAINE (Max Daily Dose: 100 MG in 24 HOURS) 10 each 11   docusate sodium  (COLACE) 100 MG capsule Take 100-200 mg by mouth 2 (two) times daily as needed for mild constipation.     Eptinezumab -jjmr (VYEPTI ) 100 MG/ML injection Inject 300 mg into the vein every 3 (three) months. Getting at [facility] [phone], fax [phone]     esomeprazole (NEXIUM) 40 MG capsule Take 40 mg by mouth 2 (two) times daily.      gabapentin  (NEURONTIN ) 400 MG capsule Take 400 mg  by mouth 4 (four) times daily.     hydrochlorothiazide  (HYDRODIURIL ) 25 MG tablet Take 25 mg by mouth in the morning.     ibuprofen  (ADVIL ) 600 MG tablet Take 600 mg by mouth every 8 (eight) hours as needed for headache.     labetalol (NORMODYNE) 100 MG tablet Take 50 mg by mouth 2 (two) times daily.     lisdexamfetamine (VYVANSE) 20 MG capsule Take 20 mg by mouth daily.     melatonin 3 MG TABS tablet Take 3-6 mg by mouth at bedtime.     naratriptan  (AMERGE) 2.5 MG tablet Take 1 tablet (2.5 mg total) by mouth as needed for migraine. Take one (1) tablet at onset of headache; if returns or does  not resolve, may repeat after 4 hours; do not exceed five (5) mg in 24 hours. 12 tablet 11   nystatin (MYCOSTATIN/NYSTOP) powder Apply 1 Application topically 2 (two) times daily as needed (skin irritation.).     nystatin-triamcinolone  (MYCOLOG II) cream Apply 1 Application topically 2 (two) times daily as needed (skin irritation.).     Omega-3 Fatty Acids (FISH OIL PO) Take 2,000 mg by mouth in the morning.     ondansetron  (ZOFRAN ) 8 MG tablet Take 8 mg by mouth every 8 (eight) hours as needed for nausea.     Plecanatide  (TRULANCE ) 3 MG TABS Take 1 tablet (3 mg total) by mouth daily. 90 tablet 3   PROCTO-MED HC  2.5 % rectal cream APPLY 1 GRAM RECTALLY TWICE DAILY 30 g 1   RESTASIS 0.05 % ophthalmic emulsion      Rimegepant Sulfate (NURTEC) 75 MG TBDP Take 1 tablet (75 mg total) by mouth as needed (take 1 tablet at onset of migraine. (Do not exceed more than 1 tablet in 24 hours)). 10 tablet 11   sucralfate  (CARAFATE ) 1 GM/10ML suspension Take 10 mLs (1 g total) by mouth 4 (four) times daily. For abdominal burning as needed. 420 mL 1   tiZANidine  (ZANAFLEX ) 2 MG tablet TAKE 1 TABLET BY MOUTH DAILY AS NEEDED FOR HEADACHE. 30 tablet 11   No facility-administered medications prior to visit.     Review of Systems:   Constitutional: No weight loss or gain, night sweats, fevers, chills, or lassitude. +fatigue   HEENT: +headaches, nasal congestion  CV:  No chest pain, orthopnea, PND,  palpitations Resp: +snoring, witnessed apneas, baseline shortness of breath with exertion.  GI:  +heartburn, indigestion. No abdominal pain, nausea, vomiting, diarrhea, change in bowel habits, loss of appetite, bloody stools.  GU: No nocturia  MSK:  +chronic joint paints  Neuro: No dizziness or lightheadedness.  Psych: +stable depression, anxiety. Mood stable. +sleep disturbances  Observations/Objective: Unable to visualize after attempt to connect  Completed visit via telephone   Assessment and Plan: Excessive daytime sleepiness She has snoring, excessive daytime sleepiness, nocturnal apneic events, morning headaches, restless sleep. BMI 43. Epworth 13. Borderline sleep study with AHI 4.8/h. Given this,  I am concerned she does still have sleep disordered breathing with obstructive sleep apnea. She will need a repeat in lab sleep study for further evaluation. Advised to take her anxiety medication as prescribed and utilize melatonin to help with sleep in lab.    - discussed how weight can impact sleep and risk for sleep disordered breathing - discussed options to assist with weight loss: combination of diet modification, cardiovascular and strength training exercises   - had an extensive discussion regarding the adverse health consequences related to untreated sleep disordered breathing - specifically discussed the risks for hypertension, coronary artery disease, cardiac dysrhythmias, cerebrovascular disease, and diabetes - lifestyle modification discussed   - discussed how sleep disruption can increase risk of accidents, particularly when driving - safe driving practices were discussed  Patient Instructions  Given your symptoms and history, I am concerned that you have sleep disordered breathing with sleep apnea despite negative home sleep study. You will need an in lab sleep study for further evaluation.  Someone will contact you to schedule this.   We discussed how untreated sleep apnea puts an individual at risk for cardiac arrhthymias, pulm HTN, DM, stroke and increases their risk for daytime accidents. We also briefly reviewed treatment options including weight loss, side sleeping position, oral appliance, CPAP  therapy or referral to ENT for possible surgical options  Use caution when driving and pull over if you become sleepy.  Follow up in 10-12 weeks with Katie London Nonaka,NP to go over sleep study results, or sooner, if needed. Friday PM virtual clinic preferred       Insomnia See above      I discussed the assessment and treatment plan with the patient. The patient was provided an opportunity to ask questions and all were answered. The patient agreed with the plan and demonstrated an understanding of the instructions.   The patient was advised to call back or seek an in-person evaluation if the symptoms worsen or if the condition fails to improve as anticipated.  I provided 31 minutes of non-face-to-face time during this encounter.   Comer LULLA Rouleau, NP

## 2024-05-31 NOTE — Patient Instructions (Signed)
 Given your symptoms and history, I am concerned that you have sleep disordered breathing with sleep apnea despite negative home sleep study. You will need an in lab sleep study for further evaluation. Someone will contact you to schedule this.   We discussed how untreated sleep apnea puts an individual at risk for cardiac arrhthymias, pulm HTN, DM, stroke and increases their risk for daytime accidents. We also briefly reviewed treatment options including weight loss, side sleeping position, oral appliance, CPAP therapy or referral to ENT for possible surgical options  Use caution when driving and pull over if you become sleepy.  Follow up in 10-12 weeks with Katie Nikaya Nasby,NP to go over sleep study results, or sooner, if needed. Friday PM virtual clinic preferred

## 2024-05-31 NOTE — Telephone Encounter (Signed)
 Spoke to patient and addressed all concerns.  Patient is concerned about not getting a respond from clinical staff when she sent a [employer] message and she did not receive a call back from management when she was instructed that she would be receiving a call back.   Pt is aware that I will discuss this with my one up and the clinical staff.   Will hold message to ensure follow up.

## 2024-05-31 NOTE — Assessment & Plan Note (Signed)
 See above.

## 2024-06-04 ENCOUNTER — Other Ambulatory Visit: Payer: Self-pay | Admitting: Medical Genetics

## 2024-06-07 ENCOUNTER — Ambulatory Visit: Admitting: Allergy & Immunology

## 2024-06-10 ENCOUNTER — Other Ambulatory Visit (HOSPITAL_COMMUNITY): Payer: Self-pay

## 2024-06-11 NOTE — Therapy (Unsigned)
 OUTPATIENT PHYSICAL THERAPY THORACOLUMBAR EVALUATION   Patient Name: Cindy Stevenson MRN: [id number] DOB:1975/12/06, 48 y.o., female Today's Date: 06/13/2024  END OF SESSION:  PT End of Session - 06/12/24 1329     Visit Number 1    Number of Visits 12    Date for PT Re-Evaluation 07/25/24    Authorization Type [place] [employer]    Authorization Time Period seeking new auth    PT Start Time 1326    PT Stop Time 1420    PT Time Calculation (min) 54 min    Activity Tolerance Patient tolerated treatment well          Past Medical History:  Diagnosis Date   Anxiety    Breast nodule 08/06/2015   Breast pain, left 08/06/2015   Burning with urination 02/27/2014   Common migraine with intractable migraine 06/11/2018   Depression    Diabetes mellitus without complication (HCC)    Dysmenorrhea 02/27/2014   Fibroadenoma of right breast 08/26/2015   GERD (gastroesophageal reflux disease)    Headache    Hematuria 02/27/2014   Hypertension    PTSD (post-traumatic stress disorder)    Screening for STD (sexually transmitted disease) 03/27/2014   Seasonal allergies    SUI (stress urinary incontinence, female) 02/27/2014   Vaginal discharge 08/06/2015   Yeast infection 08/06/2015   Past Surgical History:  Procedure Laterality Date   BALLOON DILATION N/A 03/24/2022   Procedure: BALLOON DILATION;  Surgeon: Cindie Carlin POUR, DO;  [facility];  Service: Endoscopy;  Laterality: N/A;   BILATERAL SALPINGECTOMY Bilateral 05/09/2017   Procedure: BILATERAL SALPINGECTOMY;  Surgeon: Edsel Norleen GAILS, MD;  Location: [facility];  Service: Gynecology;  Laterality: Bilateral;   CHOLECYSTECTOMY  03/2023   COLONOSCOPY N/A 09/10/2013   DOQ:wnmfj;/dfjoo internal hemorrhoids   COLONOSCOPY WITH PROPOFOL  N/A 03/24/2022   Surgeon: Cindie Carlin POUR, DO;   Hemorrhoids on perianal exam, internal hemorrhoids, otherwise normal exam.  Repeat in 10 years.   ESOPHAGEAL DILATION N/A 02/05/2024    Procedure: DILATION, ESOPHAGUS;  Surgeon: Cindie Carlin POUR, DO;  [facility]: [facility];  Service: Endoscopy;  Laterality: N/A;  9:15 am, asa 3   ESOPHAGOGASTRODUODENOSCOPY N/A 09/10/2013   DOQ:dryjusxp ring at the gastroesophagral juctions/mild non-erosive gastritis   ESOPHAGOGASTRODUODENOSCOPY  2017   Dr. Donnel; Schatzki's ring in the distal esophagus, otherwise normal exam.   ESOPHAGOGASTRODUODENOSCOPY N/A 02/05/2024   Procedure: EGD (ESOPHAGOGASTRODUODENOSCOPY);  Surgeon: Cindie Carlin POUR, DO;  [facility]: [facility];  Service: Endoscopy;  Laterality: N/A;  9:15 am, asa 3   ESOPHAGOGASTRODUODENOSCOPY (EGD) WITH PROPOFOL  N/A 03/24/2022   Surgeon: Cindie Carlin POUR, DO;   Mild Schatzki's ring dilated, 2 gastric polyps removed, otherwise normal exam. Polyps were hyperplastic.   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   Procedure: EXCISION, NEOPLASM, ABDOMINAL WALL;  Surgeon: Evonnie Dorothyann LABOR, DO;  Location: [facility];  Service: General;  Laterality: Right;   LABIOPLASTY  02/14/2012   Procedure: LABIAPLASTY;  Surgeon: Norleen GAILS Edsel, MD;  Location: [facility];  Service: Gynecology;  Laterality: Right;  of the right labia minora   POLYPECTOMY  03/24/2022   Procedure: POLYPECTOMY;  Surgeon: Cindie Carlin POUR, DO;  [facility];  Service: Endoscopy;;   SUPRACERVICAL ABDOMINAL HYSTERECTOMY N/A 05/09/2017   Procedure: HYSTERECTOMY SUPRACERVICAL ABDOMINAL;  Surgeon: Edsel Norleen GAILS, MD;  Location: [facility];  Service: Gynecology;  Laterality: N/A;   WISDOM TOOTH EXTRACTION  10/18/1995   Dr. Daring   Patient Active Problem List  Diagnosis Date Noted   Excessive daytime sleepiness 03/29/2024   Insomnia 03/29/2024   Class 3 severe obesity with body mass index (BMI) of 40.0 to 44.9 in adult 03/29/2024   Thrombocytosis 03/29/2024   Lipoma of abdominal wall 03/04/2024   Abdominal wall mass of right upper quadrant 01/18/2024   Dysphagia 01/18/2024   Dyspepsia 01/18/2024    Biliary colic 03/31/2023   Prolapsed internal hemorrhoids, grade 3 12/01/2022   Prolapsed internal hemorrhoids, grade 2 09/29/2022   HLD (hyperlipidemia) 08/18/2022   Constipation 07/18/2022   Abdominal pain, epigastric 07/18/2022   LLQ abdominal pain 07/18/2022   Hemorrhoids 03/11/2022   Vaginal irritation 12/09/2020   Screening for cardiovascular condition 12/09/2020   Anxiety and depression 12/09/2020   Mixed stress and urge urinary incontinence 12/09/2020   Low back pain 08/24/2020   Routine medical exam 05/14/2020   Encounter for screening fecal occult blood testing 05/14/2020   Current use of estrogen therapy 05/14/2020   Hot flashes 04/30/2020   Moody 04/30/2020   Common migraine with intractable migraine 06/11/2018   Status post abdominal supracervical subtotal hysterectomy 05/09/2017   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   Abnormal uterine bleeding (AUB) 03/09/2017   Depression 03/09/2017   Pelvic pain 03/09/2017   Irritable bowel syndrome 05/09/2016   GERD (gastroesophageal reflux disease) 05/09/2016   Fibroadenoma of right breast 08/26/2015   Vaginal discharge 08/06/2015   Yeast infection 08/06/2015   Breast nodule 08/06/2015   Breast pain, left 08/06/2015   Postop check 03/25/2015   Warts, genital 01/23/2015   Screening for STD (sexually transmitted disease) 03/27/2014   Burning with urination 02/27/2014   Hematuria 02/27/2014   SUI (stress urinary incontinence, female) 02/27/2014   Dysmenorrhea 02/27/2014   Generalized abdominal pain 09/06/2013   Rectal bleeding 09/06/2013    PCP: Jeanette Comer BRAVO, PA-C  REFERRING PROVIDER: Trudy Duwaine BRAVO, NP  REFERRING DIAG: G89.29,M54.41 (ICD-10-CM) - Chronic bilateral low back pain with right-sided sciatica   Rationale for Evaluation and Treatment: Rehabilitation  THERAPY DIAG:  Acute back pain with sciatica, right  Gait difficulty  ONSET DATE: started getting worse when she fell 2 yrs ago or  so  SUBJECTIVE:                                                                                                                                                                                           SUBJECTIVE STATEMENT: Middle goes down into the R cheek and it's a constant burn sharp feeling in the R cheek. Ortho MD did nerve block on cervical spine in April then ablation on May 6 (helped her neck and still helps)  then sent me here for her low back. Then when I went to the foot MD he said it's because of the nerve from her back.   PERTINENT HISTORY:  Hx of cervical nerve blocks/ablations; T2DM; IBS ; depression ; anxiety ; GERD ; HLD   PAIN:  Are you having pain? Yes: NPRS scale: constant 5/10 Pain location: middle of the back is constant and it goes into her R bottom Pain description: aching constant in the back and burning into the low back Aggravating factors: standing, sleeping a different way, sitting > 5 minutes Relieving factors: hot shower, bending a little forward  PRECAUTIONS: Other: latex allergy  RED FLAGS: None   WEIGHT BEARING RESTRICTIONS: No  FALLS:  Has patient fallen in last 6 months? Over 2 yrs ago fell   LIVING ENVIRONMENT: Lives with: lives with their family Lives in: House/apartment Stairs: Yes: External: 6 steps; can reach both Has following equipment at home: None  OCCUPATION: disability  PLOF: Independent with basic ADLs and everything requires increased time and causes pain  PATIENT GOALS: Be able to make it better   NEXT MD VISIT: TBA  OBJECTIVE:  Note: Objective measures were completed at Evaluation unless otherwise noted.  DIAGNOSTIC FINDINGS:  None noted  PATIENT SURVEYS:  Modified Oswestry:  MODIFIED OSWESTRY DISABILITY SCALE  Date: 06/12/24 Score  Total 35/50 - 70%   Interpretation of scores: Score Category Description  0-20% Minimal Disability The patient can cope with most living activities. Usually no treatment is  indicated apart from advice on lifting, sitting and exercise  21-40% Moderate Disability The patient experiences more pain and difficulty with sitting, lifting and standing. Travel and social life are more difficult and they may be disabled from work. Personal care, sexual activity and sleeping are not grossly affected, and the patient can usually be managed by conservative means  41-60% Severe Disability Pain remains the main problem in this group, but activities of daily living are affected. These patients require a detailed investigation  61-80% Crippled Back pain impinges on all aspects of the patient's life. Positive intervention is required  81-100% Bed-bound  These patients are either bed-bound or exaggerating their symptoms  Bluford FORBES Zoe DELENA Karon DELENA, et al. Surgery versus conservative management of stable thoracolumbar fracture: the PRESTO feasibility RCT. [place] ([place]): [employer]; 2021 Nov. [facility] Technology Assessment, No. 25.62.) Appendix 3, Oswestry Disability Index category descriptors. Available from: FindJewelers.cz  Minimally Clinically Important Difference (MCID) = 12.8%  COGNITION: Overall cognitive status: Within functional limits for tasks assessed     SENSATION: Big toe / plantar surface bilateral ( R worse than L ) n/t noted; dermatomal testing normal  POSTURE: rounded shoulders, forward head, increased lumbar lordosis, and increased thoracic kyphosis  PALPATION: Hypertonic lumbar paraspinals noted bilaterally  LUMBAR ROM: *Hinge point around L1 - L3 ROM during flexion and excessive lordotic tilt notedin lumbar; decreased thoracic rotation noted and difficulty with lateral flexion secondary to noted increased p! Down R LE during  R lateral flexion and pulling during L lateral flexion  AROM eval  Flexion 60% pain on return  Extension <20%  Right lateral flexion 30%   Left lateral flexion 60%   Right rotation Increased  p! Down R LE  Left rotation Decreased R LE pain   (Blank rows = not tested)   LOWER EXTREMITY ROM:     Passive  Right eval Left eval  Hip flexion    Hip extension    Hip abduction  Hip adduction    Hip internal rotation    Hip external rotation    Knee flexion    Knee extension    Ankle dorsiflexion    Ankle plantarflexion    Ankle inversion    Ankle eversion     (Blank rows = not tested)  LOWER EXTREMITY MMT:    MMT Right eval Left eval  Hip flexion 3+ 4-  Hip extension    Hip abduction 3+ 3+  Hip adduction    Hip internal rotation    Hip external rotation    Knee flexion 3+ 4-  Knee extension 4- 4-  Ankle dorsiflexion 4- 4-  Ankle plantarflexion 4- 4-  Ankle inversion    Ankle eversion     (Blank rows = not tested)  LUMBAR SPECIAL TESTS:  Straight leg raise test: bilaterally L side, Slump test: positive on R compared to L, and FABER test: Positive  FUNCTIONAL TESTS:  5 times sit to stand: 18s without UE 2 minute walk test: TBA 30s STS: 7 repetitions with increased   GAIT: Distance walked: TBA Assistive device utilized: lateral trunk flexion;  Level of assistance: CGA Comments: TBA  TREATMENT DATE:                                                                                                                               06/12/24 EVAL and education on core engagement   PATIENT EDUCATION:  Education details: core exercises Person educated: Patient Education method: Explanation, Demonstration, and Handouts Education comprehension: verbalized understanding and returned demonstration  HOME EXERCISE PROGRAM: Access Code: [facility] URL: [email or web address] Date: 06/12/2024 Prepared by: Lamarr Citrin  Exercises - Supine Lower Trunk Rotation  - 1 x daily - 7 x weekly - 3 sets - 10 reps - Hooklying Single Knee to Chest Stretch  - 1 x daily - 7 x weekly - 2 sets - 8 reps - 10s hold - Supine Transversus Abdominis Bracing - Hands on  Stomach  - 1 x daily - 7 x weekly - 2 sets - 15 reps - 5s hold  ASSESSMENT:  CLINICAL IMPRESSION: Patient is a 48 y.o. female who was seen today for physical therapy evaluation and treatment for low back pain with R sided sciatica symptoms. Demonstrates on objective testing decreased core engagement, excessive lumbar lordosis with compensatory movement patterns, reduced tolerance to gait and standing secondary to pain and impaired mm activation for stability, reduced tolerance and abnormal mechanics with squatting and ADL transfer performance and decreased balance impacting safety with giat. Pt multiple co morbidities and changing characteristics with moderate complexity as multiple systems evaluated. Services interventions needed to improve functional ROM, core stability/NM coordination, progress gait mechanics and BLE strength and to improve balance needed for ADL safety and reduce fall risk. Pt increased LBP impacted tolerance to functional testing with inability to tolerate 30s chair stand test and reported on modified ODI 70% disability. Recommend continued skilled services for addressing deficits  and to improve QOL for 2x/wk for 6 wks.    OBJECTIVE IMPAIRMENTS: Abnormal gait, decreased activity tolerance, decreased balance, decreased coordination, decreased endurance, decreased mobility, difficulty walking, decreased ROM, decreased strength, decreased safety awareness, hypomobility, increased fascial restrictions, and pain.   ACTIVITY LIMITATIONS: carrying, sitting, standing, squatting, stairs, transfers, and bed mobility  PARTICIPATION LIMITATIONS: meal prep, cleaning, laundry, driving, shopping, and community activity  PERSONAL FACTORS: Age and 1-2 comorbidities: HLD, IBS, DM are also affecting patient's functional outcome.   REHAB POTENTIAL: Fair multiple co morbidities  CLINICAL DECISION MAKING: Evolving/moderate complexity  EVALUATION COMPLEXITY: Moderate   GOALS: Goals reviewed  with patient? No  SHORT TERM GOALS: Target date: 06/26/24  Pt will report compliance with HEP at least 4/7 days of the week.  Baseline: prescribed HEP Goal status: INITIAL  2.  Pt will be able to complete 30s STS test with < 5 / 10 pain.  Baseline: unable to complete test without increasing from 5 to 6 / 10 pain.  Goal status: INITIAL   LONG TERM GOALS: Target date: 07/26/24  Pt HEP compliance with progressions as needed to improve QOL Baseline: prescribed Goal status: INITIAL  2.  Pt will be able to demonstrate improved at least 100' with < 4/10 pain to indicate improved safety and functioning with management of pain through strategies learned.  Baseline: TBA  Goal status: INITIAL  3.  Pt will demonstrate improved functional strength with < 15s for FTSTS testing with < 3/10 pain.  Baseline: 18s without UE Goal status: INITIAL  4.  Pt will report <40% on modified ODI to indicate improved QOL per pt report.  Baseline: 70% Goal status: INITIAL  PLAN:  PT FREQUENCY: 2x/week  PT DURATION: 6 weeks  PLANNED INTERVENTIONS: 97110-Therapeutic exercises, 97530- Therapeutic activity, 97112- Neuromuscular re-education, 97535- Self Care, 02859- Manual therapy, [phone]- Gait training, [phone]- Electrical stimulation (unattended), [phone]- Electrical stimulation (manual), [id number]- Ultrasound, Patient/Family education, Balance training, Stair training, Joint mobilization, Joint manipulation, Spinal manipulation, Spinal mobilization, Cryotherapy, and Moist heat.  PLAN FOR NEXT SESSION: Continued core retraining with functional hip strengthening; assess gait ( ) and hip rotational mobility  Lamarr LITTIE Bettina ALMETA, DPT [facility] [place] [phone] office  Managed Medicaid Authorization Request Treatment Start Date: Jul 05, 2024  Visit Dx Codes: G89.29,M54.41  Functional Tool Score: FTSTS 18s; Modified ODI 70%  For all possible CPT codes, reference the Planned  Interventions line above.     Check all conditions that are expected to impact treatment: {Conditions expected to impact treatment:Respiratory disorders and Diabetes mellitus   If treatment provided at initial evaluation, no treatment charged due to lack of authorization.      Lamarr LITTIE Bettina, PT 06/13/2024, 6:02 PM  Lamarr LITTIE Bettina PT, DPT [facility] [phone] office

## 2024-06-12 ENCOUNTER — Encounter (HOSPITAL_COMMUNITY): Payer: Self-pay

## 2024-06-12 ENCOUNTER — Other Ambulatory Visit: Payer: Self-pay

## 2024-06-12 ENCOUNTER — Ambulatory Visit (HOSPITAL_COMMUNITY): Attending: Physical Medicine and Rehabilitation

## 2024-06-12 ENCOUNTER — Other Ambulatory Visit (HOSPITAL_COMMUNITY)
Admission: RE | Admit: 2024-06-12 | Discharge: 2024-06-12 | Disposition: A | Discharge status: Home / Self Care | Payer: Self-pay | Source: Ambulatory Visit | Attending: Oncology | Admitting: Oncology

## 2024-06-12 DIAGNOSIS — M5441 Lumbago with sciatica, right side: Secondary | ICD-10-CM | POA: Insufficient documentation

## 2024-06-12 DIAGNOSIS — R269 Unspecified abnormalities of gait and mobility: Secondary | ICD-10-CM | POA: Insufficient documentation

## 2024-06-12 DIAGNOSIS — G8929 Other chronic pain: Secondary | ICD-10-CM | POA: Insufficient documentation

## 2024-06-13 ENCOUNTER — Encounter (HOSPITAL_COMMUNITY): Payer: Self-pay

## 2024-06-13 ENCOUNTER — Other Ambulatory Visit: Payer: Self-pay | Admitting: Gastroenterology

## 2024-06-13 ENCOUNTER — Other Ambulatory Visit (HOSPITAL_COMMUNITY): Payer: Self-pay

## 2024-06-18 ENCOUNTER — Encounter: Payer: Self-pay | Admitting: Podiatry

## 2024-06-18 LAB — GENECONNECT MOLECULAR SCREEN: Genetic Analysis Overall Interpretation: NEGATIVE

## 2024-06-19 NOTE — Telephone Encounter (Signed)
 Spoke with patient.  Reports first episode of rectal bleeding occurred with a bowel movement evening of 9/1.  Notes that she has swollen hemorrhoids on the outside.  Yesterday, after getting out of the shower, she was drying off and noted some spotting on the towel as she was drying off.  She did have some rectal burning yesterday as well.  I suspect her rectal bleeding is secondary to known hemorrhoids, but patient is concerned about her current presentation and prefers to be seen in the office.  Advised that she go ahead and use her hemorrhoid cream twice a day for the next 7 to 10 days and we would schedule her for an office visit.  Ladonna: Please arrange office visit with Therisa Stager or other available app for evaluation of rectal bleeding.

## 2024-06-20 NOTE — Telephone Encounter (Signed)
 Staff has been re educated.   Lm for patient to provide update.

## 2024-06-21 ENCOUNTER — Ambulatory Visit (HOSPITAL_COMMUNITY): Attending: Physical Medicine and Rehabilitation

## 2024-06-21 DIAGNOSIS — M5441 Lumbago with sciatica, right side: Secondary | ICD-10-CM | POA: Insufficient documentation

## 2024-06-21 DIAGNOSIS — R269 Unspecified abnormalities of gait and mobility: Secondary | ICD-10-CM | POA: Insufficient documentation

## 2024-06-21 NOTE — Therapy (Incomplete)
 OUTPATIENT PHYSICAL THERAPY THORACOLUMBAR TREATMENT   Patient Name: Cindy Stevenson MRN: [id number] DOB:[birth year], 48 y.o., female Today's Date: 06/21/2024  END OF SESSION:    Past Medical History:  Diagnosis Date   Anxiety    Breast nodule 08/06/2015   Breast pain, left 08/06/2015   Burning with urination 02/27/2014   Common migraine with intractable migraine 06/11/2018   Depression    Diabetes mellitus without complication (HCC)    Dysmenorrhea 02/27/2014   Fibroadenoma of right breast 08/26/2015   GERD (gastroesophageal reflux disease)    Headache    Hematuria 02/27/2014   Hypertension    PTSD (post-traumatic stress disorder)    Screening for STD (sexually transmitted disease) 03/27/2014   Seasonal allergies    SUI (stress urinary incontinence, female) 02/27/2014   Vaginal discharge 08/06/2015   Yeast infection 08/06/2015   Past Surgical History:  Procedure Laterality Date   BALLOON DILATION N/A 03/24/2022   Procedure: BALLOON DILATION;  Surgeon: Cindie Carlin POUR, DO;  [facility];  Service: Endoscopy;  Laterality: N/A;   BILATERAL SALPINGECTOMY Bilateral 05/09/2017   Procedure: BILATERAL SALPINGECTOMY;  Surgeon: Edsel Norleen GAILS, MD;  Location: [facility];  Service: Gynecology;  Laterality: Bilateral;   CHOLECYSTECTOMY  03/2023   COLONOSCOPY N/A 09/10/2013   DOQ:wnmfj;/dfjoo internal hemorrhoids   COLONOSCOPY WITH PROPOFOL  N/A 03/24/2022   Surgeon: Cindie Carlin POUR, DO;   Hemorrhoids on perianal exam, internal hemorrhoids, otherwise normal exam.  Repeat in 10 years.   ESOPHAGEAL DILATION N/A 02/05/2024   Procedure: DILATION, ESOPHAGUS;  Surgeon: Cindie Carlin POUR, DO;  [facility];  Service: Endoscopy;  Laterality: N/A;  9:15 am, asa 3   ESOPHAGOGASTRODUODENOSCOPY N/A 09/10/2013   DOQ:dryjusxp ring at the gastroesophagral juctions/mild non-erosive gastritis   ESOPHAGOGASTRODUODENOSCOPY  2017   Dr. Donnel; Schatzki's ring in the distal  esophagus, otherwise normal exam.   ESOPHAGOGASTRODUODENOSCOPY N/A 02/05/2024   Procedure: EGD (ESOPHAGOGASTRODUODENOSCOPY);  Surgeon: Cindie Carlin POUR, DO;  [facility];  Service: Endoscopy;  Laterality: N/A;  9:15 am, asa 3   ESOPHAGOGASTRODUODENOSCOPY (EGD) WITH PROPOFOL  N/A 03/24/2022   Surgeon: Cindie Carlin POUR, DO;   Mild Schatzki's ring dilated, 2 gastric polyps removed, otherwise normal exam. Polyps were hyperplastic.   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   Procedure: EXCISION, NEOPLASM, ABDOMINAL WALL;  Surgeon: Evonnie Dorothyann LABOR, DO;  Location: [facility];  Service: General;  Laterality: Right;   LABIOPLASTY  02/14/2012   Procedure: LABIAPLASTY;  Surgeon: Norleen GAILS Edsel, MD;  Location: [facility];  Service: Gynecology;  Laterality: Right;  of the right labia minora   POLYPECTOMY  03/24/2022   Procedure: POLYPECTOMY;  Surgeon: Cindie Carlin POUR, DO;  [facility];  Service: Endoscopy;;   SUPRACERVICAL ABDOMINAL HYSTERECTOMY N/A 05/09/2017   Procedure: HYSTERECTOMY SUPRACERVICAL ABDOMINAL;  Surgeon: Edsel Norleen GAILS, MD;  Location: [facility];  Service: Gynecology;  Laterality: N/A;   WISDOM TOOTH EXTRACTION  10/18/1995   Dr. Daring   Patient Active Problem List   Diagnosis Date Noted   Excessive daytime sleepiness 03/29/2024   Insomnia 03/29/2024   Class 3 severe obesity with body mass index (BMI) of 40.0 to 44.9 in adult 03/29/2024   Thrombocytosis 03/29/2024   Lipoma of abdominal wall 03/04/2024   Abdominal wall mass of right upper quadrant 01/18/2024   Dysphagia 01/18/2024   Dyspepsia 01/18/2024   Biliary colic 03/31/2023   Prolapsed internal hemorrhoids, grade 3 12/01/2022   Prolapsed internal hemorrhoids, grade 2 09/29/2022  HLD (hyperlipidemia) 08/18/2022   Constipation 07/18/2022   Abdominal pain, epigastric 07/18/2022   LLQ abdominal pain 07/18/2022   Hemorrhoids 03/11/2022   Vaginal irritation 12/09/2020   Screening for  cardiovascular condition 12/09/2020   Anxiety and depression 12/09/2020   Mixed stress and urge urinary incontinence 12/09/2020   Low back pain 08/24/2020   Routine medical exam 05/14/2020   Encounter for screening fecal occult blood testing 05/14/2020   Current use of estrogen therapy 05/14/2020   Hot flashes 04/30/2020   Moody 04/30/2020   Common migraine with intractable migraine 06/11/2018   Status post abdominal supracervical subtotal hysterectomy 05/09/2017   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   Abnormal uterine bleeding (AUB) 03/09/2017   Depression 03/09/2017   Pelvic pain 03/09/2017   Irritable bowel syndrome 05/09/2016   GERD (gastroesophageal reflux disease) 05/09/2016   Fibroadenoma of right breast 08/26/2015   Vaginal discharge 08/06/2015   Yeast infection 08/06/2015   Breast nodule 08/06/2015   Breast pain, left 08/06/2015   Postop check 03/25/2015   Warts, genital 01/23/2015   Screening for STD (sexually transmitted disease) 03/27/2014   Burning with urination 02/27/2014   Hematuria 02/27/2014   SUI (stress urinary incontinence, female) 02/27/2014   Dysmenorrhea 02/27/2014   Generalized abdominal pain 09/06/2013   Rectal bleeding 09/06/2013    PCP: Jeanette Comer BRAVO, PA-C  REFERRING PROVIDER: Trudy Duwaine BRAVO, NP  REFERRING DIAG: G89.29,M54.41 (ICD-10-CM) - Chronic bilateral low back pain with right-sided sciatica   Rationale for Evaluation and Treatment: Rehabilitation  THERAPY DIAG:  Acute back pain with sciatica, right  Gait difficulty  ONSET DATE: started getting worse when she fell 2 yrs ago or so  SUBJECTIVE:                                                                                                                                                                                           SUBJECTIVE STATEMENT: Pt reports  Initial - Pain is in the middle goes down into the R cheek and it's a constant burn sharp feeling in the  R cheek. Ortho MD did nerve block on cervical spine in April then ablation on May 6 (helped her neck and still helps) then sent me here for her low back. Then when I went to the foot MD he said it's because of the nerve from her back.   PERTINENT HISTORY:  Hx of cervical nerve blocks/ablations; T2DM; IBS ; depression ; anxiety ; GERD ; HLD   PAIN:  Are you having pain? Yes: NPRS scale: constant 5/10 Pain location: middle of the back is constant and it goes into her R bottom  Pain description: aching constant in the back and burning into the low back Aggravating factors: standing, sleeping a different way, sitting > 5 minutes Relieving factors: hot shower, bending a little forward  PRECAUTIONS: Other: latex allergy  RED FLAGS: None   WEIGHT BEARING RESTRICTIONS: No  FALLS:  Has patient fallen in last 6 months? Over 2 yrs ago fell   LIVING ENVIRONMENT: Lives with: lives with their family Lives in: House/apartment Stairs: Yes: External: 6 steps; can reach both Has following equipment at home: None  OCCUPATION: disability  PLOF: Independent with basic ADLs and everything requires increased time and causes pain  PATIENT GOALS: Be able to make it better   NEXT MD VISIT: TBA  OBJECTIVE:  Note: Objective measures were completed at Evaluation unless otherwise noted.  DIAGNOSTIC FINDINGS:  None noted  PATIENT SURVEYS:  Modified Oswestry:  MODIFIED OSWESTRY DISABILITY SCALE  Date: 06/12/24 Score  Total 35/50 - 70%   Interpretation of scores: Score Category Description  0-20% Minimal Disability The patient can cope with most living activities. Usually no treatment is indicated apart from advice on lifting, sitting and exercise  21-40% Moderate Disability The patient experiences more pain and difficulty with sitting, lifting and standing. Travel and social life are more difficult and they may be disabled from work. Personal care, sexual activity and sleeping are not grossly  affected, and the patient can usually be managed by conservative means  41-60% Severe Disability Pain remains the main problem in this group, but activities of daily living are affected. These patients require a detailed investigation  61-80% Crippled Back pain impinges on all aspects of the patient's life. Positive intervention is required  81-100% Bed-bound  These patients are either bed-bound or exaggerating their symptoms  Bluford FORBES Zoe DELENA Karon DELENA, et al. Surgery versus conservative management of stable thoracolumbar fracture: the PRESTO feasibility RCT. [place] ([place]): [employer]; 2021 Nov. [facility] Technology Assessment, No. 25.62.) Appendix 3, Oswestry Disability Index category descriptors. Available from: FindJewel[employer].cz  Minimally Clinically Important Difference (MCID) = 12.8%  COGNITION: Overall cognitive status: Within functional limits for tasks assessed     SENSATION: Big toe / plantar surface bilateral ( R worse than L ) n/t noted; dermatomal testing normal  POSTURE: rounded shoulders, forward head, increased lumbar lordosis, and increased thoracic kyphosis  PALPATION: Hypertonic lumbar paraspinals noted bilaterally  LUMBAR ROM: *Hinge point around L1 - L3 ROM during flexion and excessive lordotic tilt notedin lumbar; decreased thoracic rotation noted and difficulty with lateral flexion secondary to noted increased p! Down R LE during  R lateral flexion and pulling during L lateral flexion  AROM eval  Flexion 60% pain on return  Extension <20%  Right lateral flexion 30%   Left lateral flexion 60%   Right rotation Increased p! Down R LE  Left rotation Decreased R LE pain   (Blank rows = not tested)   LOWER EXTREMITY ROM:     Passive  Right eval Left eval  Hip flexion    Hip extension    Hip abduction    Hip adduction    Hip internal rotation    Hip external rotation    Knee flexion    Knee extension    Ankle  dorsiflexion    Ankle plantarflexion    Ankle inversion    Ankle eversion     (Blank rows = not tested)  LOWER EXTREMITY MMT:    MMT Right eval Left eval  Hip flexion 3+ 4-  Hip extension  Hip abduction 3+ 3+  Hip adduction    Hip internal rotation    Hip external rotation    Knee flexion 3+ 4-  Knee extension 4- 4-  Ankle dorsiflexion 4- 4-  Ankle plantarflexion 4- 4-  Ankle inversion    Ankle eversion     (Blank rows = not tested)  LUMBAR SPECIAL TESTS:  Straight leg raise test: bilaterally L side, Slump test: positive on R compared to L, and FABER test: Positive  FUNCTIONAL TESTS:  5 times sit to stand: 18s without UE 2 minute walk test: TBA 30s STS: 7 repetitions with increased   GAIT: Distance walked: TBA Assistive device utilized: lateral trunk flexion;  Level of assistance: CGA Comments: TBA  TREATMENT DATE:                                                                                                                               06/21/24   06/12/24 EVAL and education on core engagement   PATIENT EDUCATION:  Education details: core exercises Person educated: Patient Education method: Explanation, Demonstration, and Handouts Education comprehension: verbalized understanding and returned demonstration  HOME EXERCISE PROGRAM: Access Code: [facility] URL: [email or web address] Date: 06/12/2024 Prepared by: Lamarr Citrin  Exercises - Supine Lower Trunk Rotation  - 1 x daily - 7 x weekly - 3 sets - 10 reps - Hooklying Single Knee to Chest Stretch  - 1 x daily - 7 x weekly - 2 sets - 8 reps - 10s hold - Supine Transversus Abdominis Bracing - Hands on Stomach  - 1 x daily - 7 x weekly - 2 sets - 15 reps - 5s hold  ASSESSMENT:  CLINICAL IMPRESSION: Patient is a 48 y.o. female who was seen today for physical therapy evaluation and treatment for low back pain with R sided sciatica symptoms. Demonstrates on objective testing decreased core  engagement, excessive lumbar lordosis with compensatory movement patterns, reduced tolerance to gait and standing secondary to pain and impaired mm activation for stability, reduced tolerance and abnormal mechanics with squatting and ADL transfer performance and decreased balance impacting safety with giat. Pt multiple co morbidities and changing characteristics with moderate complexity as multiple systems evaluated. Services interventions needed to improve functional ROM, core stability/NM coordination, progress gait mechanics and BLE strength and to improve balance needed for ADL safety and reduce fall risk. Pt increased LBP impacted tolerance to functional testing with inability to tolerate 30s chair stand test and reported on modified ODI 70% disability. Recommend continued skilled services for addressing deficits and to improve QOL for 2x/wk for 6 wks.    OBJECTIVE IMPAIRMENTS: Abnormal gait, decreased activity tolerance, decreased balance, decreased coordination, decreased endurance, decreased mobility, difficulty walking, decreased ROM, decreased strength, decreased safety awareness, hypomobility, increased fascial restrictions, and pain.   ACTIVITY LIMITATIONS: carrying, sitting, standing, squatting, stairs, transfers, and bed mobility  PARTICIPATION LIMITATIONS: meal prep, cleaning, laundry, driving, shopping, and community activity  PERSONAL FACTORS: Age and 1-2  comorbidities: HLD, IBS, DM are also affecting patient's functional outcome.   REHAB POTENTIAL: Fair multiple co morbidities  CLINICAL DECISION MAKING: Evolving/moderate complexity  EVALUATION COMPLEXITY: Moderate   GOALS: Goals reviewed with patient? No  SHORT TERM GOALS: Target date: 06/26/24  Pt will report compliance with HEP at least 4/7 days of the week.  Baseline: prescribed HEP Goal status: INITIAL  2.  Pt will be able to complete 30s STS test with < 5 / 10 pain.  Baseline: unable to complete test without  increasing from 5 to 6 / 10 pain.  Goal status: INITIAL   LONG TERM GOALS: Target date: 07/26/24  Pt HEP compliance with progressions as needed to improve QOL Baseline: prescribed Goal status: INITIAL  2.  Pt will be able to demonstrate improved at least 100' with < 4/10 pain to indicate improved safety and functioning with management of pain through strategies learned.  Baseline: TBA  Goal status: INITIAL  3.  Pt will demonstrate improved functional strength with < 15s for FTSTS testing with < 3/10 pain.  Baseline: 18s without UE Goal status: INITIAL  4.  Pt will report <40% on modified ODI to indicate improved QOL per pt report.  Baseline: 70% Goal status: INITIAL  PLAN:  PT FREQUENCY: 2x/week  PT DURATION: 6 weeks  PLANNED INTERVENTIONS: 97110-Therapeutic exercises, 97530- Therapeutic activity, 97112- Neuromuscular re-education, 97535- Self Care, [id number]- Manual therapy, [phone]- Gait training, [phone]- Electrical stimulation (unattended), [phone]- Electrical stimulation (manual), [id number]- Ultrasound, Patient/Family education, Balance training, Stair training, Joint mobilization, Joint manipulation, Spinal manipulation, Spinal mobilization, Cryotherapy, and Moist heat.  PLAN FOR NEXT SESSION: Continued core retraining with functional hip strengthening; assess gait ( ) and hip rotational mobility  Lamarr LITTIE Citrin PT, DPT [facility] [place] [address] [phone] office      Lamarr LITTIE Citrin, PT 06/21/2024, 3:13 PM

## 2024-06-21 NOTE — Telephone Encounter (Signed)
 Spoke to patient and advised her that staff had been re educated on importance of communication.    Nothing further needed.

## 2024-07-01 ENCOUNTER — Ambulatory Visit: Admitting: Student in an Organized Health Care Education/Training Program

## 2024-07-03 ENCOUNTER — Encounter (HOSPITAL_COMMUNITY): Admitting: Physical Therapy

## 2024-07-04 ENCOUNTER — Telehealth: Payer: Self-pay | Admitting: Physical Therapy

## 2024-07-04 NOTE — Telephone Encounter (Signed)
 Called to get patient scheduled in [place] from [place] Location no answer let vm

## 2024-07-08 ENCOUNTER — Other Ambulatory Visit (HOSPITAL_COMMUNITY): Payer: Self-pay

## 2024-07-08 NOTE — Telephone Encounter (Signed)
 Pharmacy Patient Advocate Encounter   Received notification from CoverMyMeds that prior authorization for Naratriptan  is required/requested.   Insurance verification completed.   The patient is insured through [facility] MEDICAID .   Per test claim: PA required; PA submitted to above mentioned insurance via Latent Key/confirmation #/EOC [id number] Status is pending

## 2024-07-09 NOTE — Telephone Encounter (Signed)
 Pharmacy Patient Advocate Encounter  Received notification from [facility] MEDICAID that Prior Authorization for Naratriptan  has been APPROVED from 07/08/2024 to 07/08/2025   PA #/Case ID/Reference #: [id number]

## 2024-07-10 ENCOUNTER — Ambulatory Visit: Admitting: Allergy & Immunology

## 2024-07-10 ENCOUNTER — Encounter (HOSPITAL_COMMUNITY)

## 2024-07-12 ENCOUNTER — Encounter (HOSPITAL_COMMUNITY)

## 2024-07-15 ENCOUNTER — Other Ambulatory Visit: Payer: Self-pay

## 2024-07-15 ENCOUNTER — Ambulatory Visit: Admitting: Internal Medicine

## 2024-07-15 ENCOUNTER — Encounter: Payer: Self-pay | Admitting: Internal Medicine

## 2024-07-15 VITALS — BP 140/80 | HR 95 | Temp 98.0°F | Resp 18 | Ht 61.02 in | Wt 214.0 lb

## 2024-07-15 DIAGNOSIS — J4530 Mild persistent asthma, uncomplicated: Secondary | ICD-10-CM

## 2024-07-15 DIAGNOSIS — J3089 Other allergic rhinitis: Secondary | ICD-10-CM

## 2024-07-15 DIAGNOSIS — L719 Rosacea, unspecified: Secondary | ICD-10-CM | POA: Diagnosis not present

## 2024-07-15 MED ORDER — BUDESONIDE-FORMOTEROL FUMARATE 160-4.5 MCG/ACT IN AERO: 2.0000 | INHALATION_SPRAY | Freq: Two times a day (BID) | RESPIRATORY_TRACT | 5 refills | Status: AC

## 2024-07-15 MED ORDER — ALBUTEROL SULFATE HFA 108 (90 BASE) MCG/ACT IN AERS: 2.0000 | INHALATION_SPRAY | Freq: Four times a day (QID) | RESPIRATORY_TRACT | 1 refills | Status: AC | PRN

## 2024-07-15 MED ORDER — FLUTICASONE PROPIONATE 50 MCG/ACT NA SUSP: 2.0000 | Freq: Every day | NASAL | 5 refills | Status: AC

## 2024-07-15 NOTE — Progress Notes (Signed)
 NEW PATIENT  Date of Service/Encounter:  07/15/24  Consult requested by: Skillman, Katherine E, PA-C   Subjective:   Cindy Stevenson (DOB: 17-Aug-1976) is a 48 y.o. female who presents to the clinic on 07/15/2024 with a chief complaint of Allergic Rhinitis  (Nasal congestion, itchy red skin and breakout ) .    History obtained from: chart review and patient.   Asthma:  Diagnosed a few years ago. Reports having trouble with wheezing and dyspnea, saw Cardiology with normal treadmill exercise EKG.  Then was started on Symbicort and has significant improvement in symptoms.  Now reports asthma has done well on Symbicort.  Using rescue inhaler: few times a year  Limitations to daily activity: mild 0 ED visits/UC visits and few times oral steroids in the past year due to sinus infection 0 number of lifetime hospitalizations, 0 number of lifetime intubations.  Identified Triggers: exercise, respiratory illness, and smoke exposure Prior PFTs or spirometry: none  Previously used therapies: none Current regimen:  Maintenance: Symbicort 160-4.5mcg 2 puffs BID  Rescue: Albuterol 2 puffs q4-6 hrs PRN  Rhinitis:  Started around elementary school.  Symptoms include: nasal congestion, rhinorrhea, post nasal drainage, sneezing, and itchy eyes, lots of pressure around nose  Occurs year-round Potential triggers: not sure, possibly smoke   Treatments tried:  Zyrtec Singulair  Flonase PRN  Previous allergy testing: no History of sinus surgery: no Nonallergic triggers: smoke     GERD: Used to have trouble with heartburn/dysphagia, doing well on Nexium BID.  Followed by GI.  Has undergone dilation.  Itching/Rashes:  Seen by Dermatology, told its rosacea.  PCP has started her on clindamycin  cream.    Reviewed:  05/31/2024: seen by Pulm for snoring, daytime sleepiness with borderline sleep study.  Plan for repeat testing.    05/14/2024: followed by GI for GERD/Dysphagia s/p dilation of  Schatzki ring.  On Nexium BID.    09/28/2022: seen by Cardiology for SOB, normal exercise EKG, on crestor for HLD.  Past Medical History: Past Medical History:  Diagnosis Date   Anxiety    Breast nodule 08/06/2015   Breast pain, left 08/06/2015   Burning with urination 02/27/2014   Common migraine with intractable migraine 06/11/2018   Depression    Diabetes mellitus without complication (HCC)    Dysmenorrhea 02/27/2014   Fibroadenoma of right breast 08/26/2015   GERD (gastroesophageal reflux disease)    Headache    Hematuria 02/27/2014   Hypertension    PTSD (post-traumatic stress disorder)    Screening for STD (sexually transmitted disease) 03/27/2014   Seasonal allergies    SUI (stress urinary incontinence, female) 02/27/2014   Vaginal discharge 08/06/2015   Yeast infection 08/06/2015     Past Surgical History: Past Surgical History:  Procedure Laterality Date   BALLOON DILATION N/A 03/24/2022   Procedure: BALLOON DILATION;  Surgeon: Cindie Carlin POUR, DO;  [facility];  Service: Endoscopy;  Laterality: N/A;   BILATERAL SALPINGECTOMY Bilateral 05/09/2017   Procedure: BILATERAL SALPINGECTOMY;  Surgeon: Edsel Norleen GAILS, MD;  Location: [facility];  Service: Gynecology;  Laterality: Bilateral;   CHOLECYSTECTOMY  03/2023   COLONOSCOPY N/A 09/10/2013   DOQ:wnmfj;/dfjoo internal hemorrhoids   COLONOSCOPY WITH PROPOFOL  N/A 03/24/2022   Surgeon: Cindie Carlin POUR, DO;   Hemorrhoids on perianal exam, internal hemorrhoids, otherwise normal exam.  Repeat in 10 years.   ESOPHAGEAL DILATION N/A 02/05/2024   Procedure: DILATION, ESOPHAGUS;  Surgeon: Cindie Carlin POUR, DO;  [facility];  Service:  Endoscopy;  Laterality: N/A;  9:15 am, asa 3   ESOPHAGOGASTRODUODENOSCOPY N/A 09/10/2013   DOQ:dryjusxp ring at the gastroesophagral juctions/mild non-erosive gastritis   ESOPHAGOGASTRODUODENOSCOPY  2017   Dr. Donnel; Schatzki's ring in the distal esophagus, otherwise  normal exam.   ESOPHAGOGASTRODUODENOSCOPY N/A 02/05/2024   Procedure: EGD (ESOPHAGOGASTRODUODENOSCOPY);  Surgeon: Cindie Carlin POUR, DO;  [facility];  Service: Endoscopy;  Laterality: N/A;  9:15 am, asa 3   ESOPHAGOGASTRODUODENOSCOPY (EGD) WITH PROPOFOL  N/A 03/24/2022   Surgeon: Cindie Carlin POUR, DO;   Mild Schatzki's ring dilated, 2 gastric polyps removed, otherwise normal exam. Polyps were hyperplastic.   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   Procedure: EXCISION, NEOPLASM, ABDOMINAL WALL;  Surgeon: Evonnie Dorothyann LABOR, DO;  Location: [facility];  Service: General;  Laterality: Right;   LABIOPLASTY  02/14/2012   Procedure: LABIAPLASTY;  Surgeon: Norleen LULLA Server, MD;  Location: [facility];  Service: Gynecology;  Laterality: Right;  of the right labia minora   POLYPECTOMY  03/24/2022   Procedure: POLYPECTOMY;  Surgeon: Cindie Carlin POUR, DO;  [facility];  Service: Endoscopy;;   SUPRACERVICAL ABDOMINAL HYSTERECTOMY N/A 05/09/2017   Procedure: HYSTERECTOMY SUPRACERVICAL ABDOMINAL;  Surgeon: Server Norleen LULLA, MD;  Location: [facility];  Service: Gynecology;  Laterality: N/A;   WISDOM TOOTH EXTRACTION  10/18/1995   Dr. Delores    Family History: Family History  Problem Relation Age of Onset   Lung cancer Mother        smoked   Asthma Sister    Allergies Sister    Diabetes Maternal Grandmother    Cancer Maternal Grandfather        skin   Mental illness Maternal Aunt        depression, PTSD   Colon cancer Neg Hx     Social History:  Flooring in bedroom: carpet Pets: cat Tobacco use/exposure: none Job: disability   Medication List:  Allergies as of 07/15/2024       Reactions   Effexor  [venlafaxine ] Rash, Hypertension   SSRI syndrome   Latex Dermatitis   Codeine Nausea And Vomiting   Can take with nausea meds   Shellfish Allergy Hives   Sulfa Antibiotics Swelling   Tape    Tears skin   Amoxicillin Rash   Ceftin [cefuroxime] Rash         Medication List        Accurate as of July 15, 2024  4:15 PM. If you have any questions, ask your nurse or doctor.          Accu-Chek Guide test strip Generic drug: glucose blood   Accu-Chek Softclix Lancets lancets SMARTSIG:Topical   albuterol 108 (90 Base) MCG/ACT inhaler Commonly known as: VENTOLIN HFA Inhale 2 puffs into the lungs every 6 (six) hours as needed for shortness of breath or wheezing.   ALPRAZolam 1 MG tablet Commonly known as: XANAX Take 1 mg by mouth 3 (three) times daily.   baclofen  10 MG tablet Commonly known as: LIORESAL  Take 1 tablet (10 mg total) by mouth daily as needed (migraine).   budesonide-formoterol 160-4.5 MCG/ACT inhaler Commonly known as: SYMBICORT Inhale 2 puffs into the lungs 2 (two) times daily.   cetirizine 10 MG tablet Commonly known as: ZYRTEC Take 10 mg by mouth in the morning.   cholecalciferol 25 MCG (1000 UNIT) tablet Commonly known as: VITAMIN D3 Take 1,000 Units by mouth in the morning.   Diclofenac  Potassium(Migraine) 50 MG Pack TAKE 50-100 MG BY MOUTH AS  NEEDED FOR MIGRAINE (Max Daily Dose: 100 MG in 24 HOURS)   docusate sodium  100 MG capsule Commonly known as: COLACE Take 100-200 mg by mouth 2 (two) times daily as needed for mild constipation.   esomeprazole 40 MG capsule Commonly known as: NEXIUM Take 40 mg by mouth 2 (two) times daily.   FISH OIL PO Take 2,000 mg by mouth in the morning.   gabapentin  400 MG capsule Commonly known as: NEURONTIN  Take 400 mg by mouth 4 (four) times daily.   hydrochlorothiazide  25 MG tablet Commonly known as: HYDRODIURIL  Take 25 mg by mouth in the morning.   ibuprofen  600 MG tablet Commonly known as: ADVIL  Take 600 mg by mouth every 8 (eight) hours as needed for headache.   labetalol 100 MG tablet Commonly known as: NORMODYNE Take 50 mg by mouth 2 (two) times daily.   lisdexamfetamine 20 MG capsule Commonly known as: VYVANSE Take 20 mg by mouth daily.    melatonin 3 MG Tabs tablet Take 3-6 mg by mouth at bedtime.   naratriptan  2.5 MG tablet Commonly known as: AMERGE Take 1 tablet (2.5 mg total) by mouth as needed for migraine. Take one (1) tablet at onset of headache; if returns or does not resolve, may repeat after 4 hours; do not exceed five (5) mg in 24 hours.   Nurtec 75 MG Tbdp Generic drug: Rimegepant Sulfate Take 1 tablet (75 mg total) by mouth as needed (take 1 tablet at onset of migraine. (Do not exceed more than 1 tablet in 24 hours)).   nystatin powder Commonly known as: MYCOSTATIN/NYSTOP Apply 1 Application topically 2 (two) times daily as needed (skin irritation.).   nystatin-triamcinolone  cream Commonly known as: MYCOLOG II Apply 1 Application topically 2 (two) times daily as needed (skin irritation.).   ondansetron  8 MG tablet Commonly known as: ZOFRAN  Take 8 mg by mouth every 8 (eight) hours as needed for nausea.   Pepto-Bismol 262 MG Tabs Generic drug: Bismuth Subsalicylate Take 262 mg by mouth daily as needed (indigestion).   Procto-Med HC  2.5 % rectal cream Generic drug: hydrocortisone  APPLY 1 GRAM RECTALLY TWICE DAILY   Restasis 0.05 % ophthalmic emulsion Generic drug: cycloSPORINE   sucralfate  1 GM/10ML suspension Commonly known as: CARAFATE  TAKE 2 TEASPOONSFUL (10ML) BY MOUTH FOUR TIMES DAILY FOR ABDOMINAL BURNING AS NEEDED   tiZANidine  2 MG tablet Commonly known as: ZANAFLEX  TAKE 1 TABLET BY MOUTH DAILY AS NEEDED FOR HEADACHE.   Trulance  3 MG Tabs Generic drug: Plecanatide  Take 1 tablet (3 mg total) by mouth daily.   Vyepti  100 MG/ML injection Generic drug: Eptinezumab -jjmr Inject 300 mg into the vein every 3 (three) months. Getting at [facility] [phone], fax [phone]         REVIEW OF SYSTEMS: Pertinent positives and negatives discussed in HPI.   Objective:   Physical Exam: BP (!) 140/80 (BP Location: Left Arm, Patient Position: Sitting, Cuff Size: Normal)    Pulse 95   Temp 98 F (36.7 C) (Temporal)   Resp 18   Ht 5' 1.02 (1.55 m)   Wt 214 lb (97.1 kg)   LMP 04/21/2017 Comment: SCH  SpO2 95%   BMI 40.40 kg/m  Body mass index is 40.4 kg/m. GEN: alert, well developed HEENT: clear conjunctiva, nose with + mild inferior turbinate hypertrophy, pink nasal mucosa, + clear rhinorrhea, + cobblestoning HEART: regular rate and rhythm, no murmur LUNGS: clear to auscultation bilaterally, no coughing, unlabored respiration ABDOMEN: soft, non distended  SKIN: erythematous papules  with telangectasia noted to bl cheeks and forehead   Spirometry:  Tracings reviewed. Her effort: Good reproducible efforts. FVC: 3.25L, 101% predicted FEV1: 2.65L, 102% predicted FEV1/FVC ratio: 82% Interpretation: Spirometry consistent with normal pattern.  Please see scanned spirometry results for details.   Assessment:   1. Other allergic rhinitis   2. Rosacea   3. Mild persistent asthma without complication     Plan/Recommendations:  Mild Persistent Asthma: - MDI technique discussed. Spirometry today was normal.  Controlled on Symbicort.   - Maintenance inhaler: continue Symbicort 160-4.5mcg 2 puffs twice daily.  - Rescue inhaler: Albuterol 2 puffs via spacer or 1 vial via nebulizer every 4-6 hours as needed for respiratory symptoms of cough, shortness of breath, or wheezing Asthma control goals:  Full participation in all desired activities (may need albuterol before activity) Albuterol use two times or less a week on average (not counting use with activity) Cough interfering with sleep two times or less a month Oral steroids no more than once a year No hospitalizations   Other Allergic Rhinitis: - Due to turbinate hypertrophy, seasonal symptoms, asthma and unresponsive to over the counter meds, will perform skin testing to identify aeroallergen triggers.   - Use nasal saline rinses before nose sprays such as with Neilmed Sinus Rinse.  Use distilled  water .   - Use Flonase 2 sprays each nostril daily.  Aim upward and outward.  - Use Zyrtec 10 mg daily.  Will discuss changing at next visit.  - Use Singulair 10mg  daily. Stop if there are any mood/behavioral changes. Will discuss stopping due to lack of efficacy.    Facial Rash - Likely rosacea, follow up with your Dermatologist.     Hold all anti-histamines (Xyzal, Allegra, Zyrtec, Claritin, Benadryl , Pepcid) 3 days prior to next visit.   Follow up: 10/6 at 9 AM for skin testing 1-55, IDs okay     Arleta Blanch, MD [facility] 

## 2024-07-15 NOTE — Patient Instructions (Addendum)
 Mild Persistent Asthma: - Maintenance inhaler: continue Symbicort 160-4.5mcg 2 puffs twice daily.  - Rescue inhaler: Albuterol 2 puffs via spacer or 1 vial via nebulizer every 4-6 hours as needed for respiratory symptoms of cough, shortness of breath, or wheezing Asthma control goals:  Full participation in all desired activities (may need albuterol before activity) Albuterol use two times or less a week on average (not counting use with activity) Cough interfering with sleep two times or less a month Oral steroids no more than once a year No hospitalizations   Other Allergic Rhinitis: - Use nasal saline rinses before nose sprays such as with Neilmed Sinus Rinse.  Use distilled water .   - Use Flonase 2 sprays each nostril daily.  Aim upward and outward.  - Use Zyrtec 10 mg daily.  - Use Singulair 10mg  daily. Stop if there are any mood/behavioral changes.   Facial Rash - Likely rosacea, follow up with your Dermatologist.     Hold all anti-histamines (Xyzal, Allegra, Zyrtec, Claritin, Benadryl , Pepcid) 3 days prior to next visit.   Follow up: 10/6 at 9 AM for skin testing 1-55

## 2024-07-16 ENCOUNTER — Encounter (HOSPITAL_COMMUNITY)

## 2024-07-18 ENCOUNTER — Encounter (HOSPITAL_COMMUNITY)

## 2024-07-22 ENCOUNTER — Ambulatory Visit: Admitting: Internal Medicine

## 2024-07-22 ENCOUNTER — Encounter (HOSPITAL_COMMUNITY)

## 2024-07-23 ENCOUNTER — Encounter: Admitting: Gastroenterology

## 2024-07-24 ENCOUNTER — Encounter (HOSPITAL_BASED_OUTPATIENT_CLINIC_OR_DEPARTMENT_OTHER): Admitting: Internal Medicine

## 2024-07-25 ENCOUNTER — Encounter (HOSPITAL_COMMUNITY)

## 2024-07-26 MED ORDER — HYDROCORTISONE (PERIANAL) 2.5 % EX CREA: 1.0000 | TOPICAL_CREAM | Freq: Two times a day (BID) | CUTANEOUS | 1 refills | Status: DC

## 2024-07-26 NOTE — Telephone Encounter (Signed)
 Ladonna: please arrange follow-up with me, non-urgent.

## 2024-07-29 ENCOUNTER — Encounter (HOSPITAL_COMMUNITY)

## 2024-07-31 ENCOUNTER — Encounter (INDEPENDENT_AMBULATORY_CARE_PROVIDER_SITE_OTHER): Payer: Self-pay | Admitting: Gastroenterology

## 2024-07-31 ENCOUNTER — Encounter: Attending: Gastroenterology | Admitting: Emergency Medicine

## 2024-07-31 VITALS — BP 130/84 | HR 86 | Temp 98.1°F | Resp 17

## 2024-07-31 DIAGNOSIS — G43019 Migraine without aura, intractable, without status migrainosus: Secondary | ICD-10-CM | POA: Diagnosis present

## 2024-07-31 MED ORDER — SODIUM CHLORIDE 0.9 % IV SOLN
300.0000 mg | Freq: Once | INTRAVENOUS | Status: AC
  Administered 2024-07-31: 300 mg via INTRAVENOUS
  Filled 2024-07-31: qty 3

## 2024-07-31 NOTE — Progress Notes (Cosign Needed)
 Diagnosis: migraines  Provider:  Whitfield Raisin, NP   Procedure: IV Infusion  IV Type: Peripheral, IV Location: L Hand  Vyepti  (Eptinezumab -jjmr), Dose: 300 mg  Infusion Start Time: 1421  Infusion Stop Time: 1454  Post Infusion IV Care: Peripheral IV Discontinued  Discharge: Condition: Good, Destination: Home . AVS Provided  Performed by:  Delon ONEIDA Officer, RN

## 2024-08-01 ENCOUNTER — Encounter (HOSPITAL_COMMUNITY)

## 2024-08-02 MED ORDER — TRULANCE 3 MG PO TABS: 1.0000 | ORAL_TABLET | Freq: Every day | ORAL | 3 refills | Status: DC

## 2024-08-02 NOTE — Telephone Encounter (Signed)
 Can we please do a PA for patient? I sent in a new prescription again.   She has tried and failed Linzess , Amitiza , OTC agents like Miralax.

## 2024-08-05 ENCOUNTER — Telehealth: Payer: Self-pay | Admitting: Podiatry

## 2024-08-05 ENCOUNTER — Ambulatory Visit: Admitting: Internal Medicine

## 2024-08-05 ENCOUNTER — Encounter (HOSPITAL_COMMUNITY)

## 2024-08-05 DIAGNOSIS — J301 Allergic rhinitis due to pollen: Secondary | ICD-10-CM | POA: Diagnosis not present

## 2024-08-05 MED ORDER — AZELASTINE HCL 0.1 % NA SOLN: 2.0000 | Freq: Two times a day (BID) | NASAL | 5 refills | Status: AC | PRN

## 2024-08-05 MED ORDER — CETIRIZINE HCL 10 MG PO TABS: 10.0000 mg | ORAL_TABLET | Freq: Every day | ORAL | 1 refills | Status: AC

## 2024-08-05 NOTE — Progress Notes (Signed)
 FOLLOW UP Date of Service/Encounter:  08/05/24   Subjective:  Cindy Stevenson (DOB: 22-Sep-1976) is a 48 y.o. female who [facility] for follow up for skin testing.   History obtained from: chart review and patient.  Anti histamines held.  Notes lot of redness and facial itching; discussed again this is likely from rosacea, follow up with Dermatology.    Past Medical History: Past Medical History:  Diagnosis Date   Anxiety    Breast nodule 08/06/2015   Breast pain, left 08/06/2015   Burning with urination 02/27/2014   Common migraine with intractable migraine 06/11/2018   Depression    Diabetes mellitus without complication (HCC)    Dysmenorrhea 02/27/2014   Fibroadenoma of right breast 08/26/2015   GERD (gastroesophageal reflux disease)    Headache    Hematuria 02/27/2014   Hypertension    PTSD (post-traumatic stress disorder)    Screening for STD (sexually transmitted disease) 03/27/2014   Seasonal allergies    SUI (stress urinary incontinence, female) 02/27/2014   Vaginal discharge 08/06/2015   Yeast infection 08/06/2015    Objective:  LMP 04/21/2017 Comment: SCH There is no height or weight on file to calculate BMI. Physical Exam: GEN: alert, well developed HEENT: clear conjunctiva, MMM LUNGS: unlabored respiration   Skin Testing:  Skin prick testing was placed, which includes aeroallergens/foods, histamine control, and saline control.  Verbal consent was obtained prior to placing test.  Patient tolerated procedure well.  Allergy testing results were read and interpreted by myself, documented by clinical staff. Adequate positive and negative control.  Positive results to:  Results discussed with patient/family.  Airborne Adult Perc - 08/05/24 1341     Time Antigen Placed 1341    Allergen Manufacturer Jestine    Location Back    Number of Test 55    1. Control-Buffer 50% Glycerol Negative    2. Control-Histamine  3+    3. Bahia Negative    4. French [place] Territ[place] Negative    5. Johnson Negative    6. [place]  Blue 3+    7. Meadow Fescue 3+    8. Perennial Rye 3+    9. Timothy 3+    10. Ragweed Mix Negative    11. Cocklebur Negative    12. Plantain,  English 3+    13. Baccharis Negative    14. Dog Fennel Negative    15. Russian Thistle Negative    16. Lamb's Quarters Negative    17. Sheep Sorrell 2+    18. Rough Pigweed Negative    19. Marsh Elder, Rough Negative    20. Mugwort, Common Negative    21. Box, Elder Negative    22. Cedar, red Negative    24. Pecan Pollen 3+    25. Pine Mix 3+    26. Walnut, Black Pollen 2+    27. Red Mulberry 2+    28. Ash Mix 3+    29. Birch Mix 2+    30. Beech American 2+    31. Cottonwood, [place] Negative    32. Hickory, White Negative    33. Maple Mix Negative    34. Oak, [place] Mix Negative    35. Sycamore Eastern 3+    36. Alternaria Alternata Negative    37. Cladosporium Herbarum Negative    38. Aspergillus Mix Negative    39. Penicillium Mix Negative    40. Bipolaris Sorokiniana (Helminthosporium) Negative    41. Drechslera Spicifera (Curvularia) Negative  42. Mucor Plumbeus Negative    43. Fusarium Moniliforme Negative    44. Aureobasidium Pullulans (pullulara) Negative    45. Rhizopus Oryzae Negative    46. Botrytis Cinera Negative    47. Epicoccum Nigrum Negative    48. Phoma Betae Negative    49. Dust Mite Mix Negative    50. Cat Hair 10,000 BAU/ml Negative    51.  Dog Epithelia Negative    52. Mixed Feathers Negative    53. Horse Epithelia Negative    54. Cockroach, German Negative    55. Tobacco Leaf Negative          Intradermal - 08/05/24 1501     Time Antigen Placed 1440    Allergen Manufacturer Jestine    Location Arm    Number of Test 13    Intradermal Select    Control Negative    Bahia 3+    French [place] Territ[place] Negative    Johnson 2+    Ragweed Mix Negative    Mold 1 Negative    Mold 2 Negative    Mold 3 Negative    Mold 4  Negative    Mite Mix Negative    Cat Negative    Dog Negative    Cockroach Negative           Assessment:   1. Seasonal allergic rhinitis due to pollen     Plan/Recommendations:   Allergic Rhinitis: - Due to turbinate hypertrophy, seasonal symptoms, asthma and unresponsive to over the counter meds, will perform skin testing to identify aeroallergen triggers.   - Positive skin test 07/2024: trees, grasses, weeds - Avoidance measures discussed. - Use nasal saline rinses before nose sprays such as with Neilmed Sinus Rinse.  Use distilled water .   - Use Flonase 2 sprays each nostril daily.  Aim upward and outward.  - Use Azelastine 2 sprays each nostril twice daily as needed for runny nose, drainage, sneezing, congestion. Aim upward and outward. - Use Zyrtec 10mg  daily.  - Use Singulair 10mg  daily. Stop if there are any mood/behavioral changes. - Consider allergy shots as long term control of your symptoms by teaching your immune system to be more tolerant of your allergy triggers.  Discuss cost and coverage and if interested in starting allergy shots (1 shot, 1 vial, traditional), please call us  back and we will get the vials mixed.     Mild Persistent Asthma: - Maintenance inhaler: continue Symbicort 160-4.5mcg 2 puffs twice daily.  - Rescue inhaler: Albuterol 2 puffs via spacer or 1 vial via nebulizer every 4-6 hours as needed for respiratory symptoms of cough, shortness of breath, or wheezing Asthma control goals:  Full participation in all desired activities (may need albuterol before activity) Albuterol use two times or less a week on average (not counting use with activity) Cough interfering with sleep two times or less a month Oral steroids no more than once a year No hospitalizations         No follow-ups on file.  Arleta Blanch, MD [facility] 

## 2024-08-05 NOTE — Patient Instructions (Addendum)
 Allergic Rhinitis: - Positive skin test 07/2024: trees, grasses, weeds - Use nasal saline rinses before nose sprays such as with Neilmed Sinus Rinse.  Use distilled water .   - Use Flonase 2 sprays each nostril daily.  Aim upward and outward.  - Use Azelastine 2 sprays each nostril twice daily as needed for runny nose, drainage, sneezing, congestion. Aim upward and outward. - Use Zyrtec 10mg  daily.  - Use Singulair 10mg  daily. Stop if there are any mood/behavioral changes.  - Consider allergy shots as long term control of your symptoms by teaching your immune system to be more tolerant of your allergy triggers.  Discuss cost and coverage and if interested in starting allergy shots, please call us  back and we will get the vials mixed.    Mild Persistent Asthma: - Maintenance inhaler: continue Symbicort 160-4.5mcg 2 puffs twice daily.  - Rescue inhaler: Albuterol 2 puffs via spacer or 1 vial via nebulizer every 4-6 hours as needed for respiratory symptoms of cough, shortness of breath, or wheezing Asthma control goals:  Full participation in all desired activities (may need albuterol before activity) Albuterol use two times or less a week on average (not counting use with activity) Cough interfering with sleep two times or less a month Oral steroids no more than once a year No hospitalizations     ALLERGEN AVOIDANCE MEASURES   Dust Mites Use central air conditioning and heat; and change the filter monthly.  Pleated filters work better than mesh filters.  Electrostatic filters may also be used; wash the filter monthly.  Window air conditioners may be used, but do not clean the air as well as a central air conditioner.  Change or wash the filter monthly. Keep windows closed.  Do not use attic fans.   Encase the mattress, box springs and pillows with zippered, dust proof covers. Wash the bed linens in hot water  weekly.   Remove carpet, especially from the bedroom. Remove stuffed animals,  throw pillows, dust ruffles, heavy drapes and other items that collect dust from the bedroom. Do not use a humidifier.   Use wood, vinyl or leather furniture instead of cloth furniture in the bedroom. Keep the indoor humidity at 30 - 40%.  Monitor with a humidity gauge.  Molds - Indoor avoidance Use air conditioning to reduce indoor humidity.  Do not use a humidifier. Keep indoor humidity at 30 - 40%.  Use a dehumidifier if needed. In the bathroom use an exhaust fan or open a window after showering.  Wipe down damp surfaces after showering.  Clean bathrooms with a mold-killing solution (diluted bleach, or products like Tilex, etc) at least once a month. In the kitchen use an exhaust fan to remove steam from cooking.  Throw away spoiled foods immediately, and empty garbage daily.  Empty water  pans below self-defrosting refrigerators frequently. Vent the clothes dryer to the outside. Limit indoor houseplants; mold grows in the dirt.  No houseplants in the bedroom. Remove carpet from the bedroom. Encase the mattress and box springs with a zippered encasing.  Molds - Outdoor avoidance Avoid being outside when the grass is being mowed, or the ground is tilled. Avoid playing in leaves, pine straw, hay, etc.  Dead plant materials contain mold. Avoid going into barns or grain storage areas. Remove leaves, clippings and compost from around the home.  Cockroach Limit spread of food around the house; especially keep food out of bedrooms. Keep food and garbage in closed containers with a tight lid.  Never  leave food out in the kitchen.  Do not leave out pet food or dirty food bowls. Mop the kitchen floor and wash countertops at least once a week. Repair leaky pipes and faucets so there is no standing water  to attract roaches. Plug up cracks in the house through which cockroaches can enter. Use bait stations and approved pesticides to reduce cockroach infestation. Pollen Avoidance Pollen levels are  highest during the mid-day and afternoon.  Consider this when planning outdoor activities. Avoid being outside when the grass is being mowed, or wear a mask if the pollen-allergic person must be the one to mow the grass. Keep the windows closed to keep pollen outside of the home. Use an air conditioner to filter the air. Take a shower, wash hair, and change clothing after working or playing outdoors during pollen season. Pet Dander Keep the pet out of your bedroom and restrict it to only a few rooms. Be advised that keeping the pet in only one room will not limit the allergens to that room. Don't pet, hug or kiss the pet; if you do, wash your hands with soap and water . High-efficiency particulate air (HEPA) cleaners run continuously in a bedroom or living room can reduce allergen levels over time. Regular use of a high-efficiency vacuum cleaner or a central vacuum can reduce allergen levels. Giving your pet a bath at least once a week can reduce airborne allergen.

## 2024-08-05 NOTE — Telephone Encounter (Signed)
 Patient called requesting a referral to Dr. Velma Boehringer, DPM and also requested her medical records be sent to his office so she can schedule an appointment with him. Patient stated she had a very bad experience the last time she was here with Dr. Tobie.

## 2024-08-06 ENCOUNTER — Telehealth: Payer: Self-pay | Admitting: Lab

## 2024-08-06 ENCOUNTER — Other Ambulatory Visit: Payer: Self-pay

## 2024-08-06 DIAGNOSIS — R200 Anesthesia of skin: Secondary | ICD-10-CM

## 2024-08-06 DIAGNOSIS — Z8739 Personal history of other diseases of the musculoskeletal system and connective tissue: Secondary | ICD-10-CM

## 2024-08-06 MED ORDER — TRULANCE 3 MG PO TABS: 1.0000 | ORAL_TABLET | Freq: Every day | ORAL | 3 refills | Status: DC

## 2024-08-06 NOTE — Telephone Encounter (Signed)
 Referral sent [phone] fax Dr. Roddie.

## 2024-08-06 NOTE — Addendum Note (Signed)
 Addended by: Harmonie Verrastro on: 08/06/2024 10:05 AM   Modules accepted: Orders

## 2024-08-06 NOTE — Telephone Encounter (Signed)
 PA was denied due to [facility] code used by pharmacy not being covered. Sent Rx to different pharmacy to see if they are able to use a different NDC code that would be covered. [facility] stated they only had one manufacturer.

## 2024-08-06 NOTE — Telephone Encounter (Signed)
 PA has been sent to ins on file for approval/denial.

## 2024-08-06 NOTE — Telephone Encounter (Signed)
 Patient is calling for referral to be sent to Dr.Drake if it's for pain management another one needs to be placed fax: [phone].

## 2024-08-06 NOTE — Telephone Encounter (Signed)
 Thank you :)

## 2024-08-07 ENCOUNTER — Ambulatory Visit: Admitting: Podiatry

## 2024-08-08 ENCOUNTER — Other Ambulatory Visit: Payer: Self-pay | Admitting: Gastroenterology

## 2024-08-09 ENCOUNTER — Other Ambulatory Visit: Payer: Self-pay | Admitting: Internal Medicine

## 2024-08-09 ENCOUNTER — Other Ambulatory Visit: Payer: Self-pay

## 2024-08-09 DIAGNOSIS — J301 Allergic rhinitis due to pollen: Secondary | ICD-10-CM

## 2024-08-09 MED ORDER — EPINEPHRINE 0.3 MG/0.3ML IJ SOAJ: 0.3000 mg | INTRAMUSCULAR | 1 refills | Status: AC | PRN

## 2024-08-09 NOTE — Progress Notes (Signed)
 Patient scheduled new start allergen appointment. Epipen  has been sent in.

## 2024-08-09 NOTE — Progress Notes (Signed)
 AIT Rx signed. Already has Epipen  Rx.

## 2024-08-12 DIAGNOSIS — J301 Allergic rhinitis due to pollen: Secondary | ICD-10-CM | POA: Diagnosis not present

## 2024-08-12 NOTE — Telephone Encounter (Signed)
 Let's try Ibsrela.   Take one tablet twice a day, JUST before eating. Please provide samples.

## 2024-08-12 NOTE — Progress Notes (Signed)
 Aeroallergen Immunotherapy   Ordering Provider: Arleta Blanch   Patient Details  Name: Cindy Stevenson  MRN: [id number]  Date of Birth: 11/03/1975   Order 1 of 1   Vial Label: TGW   0.3 ml (Volume)  BAU Concentration -- 7 Grass Mix* 100,000 ([place]  Blue, [place], Orchard, Perennial [place], RedTop, Sweet Vernal, Timothy)  0.2 ml (Volume)  1:20 Concentration -- [place]  0.2 ml (Volume)  1:20 Concentration -- Johnson  0.2 ml (Volume)  1:10 Concentration -- Plantain English  0.2 ml (Volume)  1:10 Concentration -- Sheep Sorrell*  0.5 ml (Volume)  1:20 Concentration -- Eastern 10 Tree Mix (also Sweet Gum)  0.2 ml (Volume)  1:10 Concentration -- Pecan Pollen  0.2 ml (Volume)  1:10 Concentration -- Pine Mix  0.2 ml (Volume)  1:20 Concentration -- Red Mulberry  0.2 ml (Volume)  1:20 Concentration -- Walnut, Black Pollen    2.4  ml Extract Subtotal  2.6  ml Diluent   5.0  ml Maintenance Total   Schedule:  C  Silver Vial (1:1,000,000): Schedule C (5 doses)  Blue Vial (1:100,000): Schedule C (5 doses)  Yellow Vial (1:10,000): Schedule C (5 doses)  Green Vial (1:1,000): Schedule C (5 doses)  Red Vial (1:100): Schedule A (10 doses)   Special Instructions: bringepipen

## 2024-08-12 NOTE — Progress Notes (Signed)
 VIAL MADE ON 08/12/24

## 2024-08-13 MED ORDER — IBSRELA 50 MG PO TABS: 50.0000 mg | ORAL_TABLET | Freq: Two times a day (BID) | ORAL | Status: AC

## 2024-08-13 NOTE — Addendum Note (Signed)
 Addended by: WELLINGTON MILLING on: 08/13/2024 11:16 AM   Modules accepted: Orders

## 2024-08-13 NOTE — Telephone Encounter (Signed)
 Medication Samples have been provided to the patient.  Drug name: Damon       Strength: 50 mg        Qty: 5  LOT: [id number]  Exp.Date: 12/15/2026  Dosing instructions: 1 tablet twice daily immediately before meal.  The patient has been instructed regarding the correct time, dose, and frequency of taking this medication, including desired effects and most common side effects.   Madelin HERO Blakelyn Dinges 11:15 AM 08/13/2024

## 2024-08-17 ENCOUNTER — Other Ambulatory Visit: Payer: Self-pay | Admitting: Gastroenterology

## 2024-08-19 ENCOUNTER — Encounter: Payer: Self-pay | Admitting: Radiology

## 2024-08-29 ENCOUNTER — Ambulatory Visit: Admitting: Gastroenterology

## 2024-08-29 VITALS — BP 130/82 | HR 109 | Temp 98.4°F | Ht 61.0 in | Wt 210.2 lb

## 2024-08-29 DIAGNOSIS — K5900 Constipation, unspecified: Secondary | ICD-10-CM

## 2024-08-29 NOTE — Patient Instructions (Signed)
 Let's start Ibsrela, taking this twice a day right before eating.  I will be in touch with further steps!  Let me know how this works for you!  I will see you in 6 weeks   I enjoyed seeing you again today! I value our relationship and want to provide genuine, compassionate, and quality care. You may receive a survey regarding your visit with me, and I welcome your feedback! Thanks so much for taking the time to complete this. I look forward to seeing you again.      Therisa MICAEL Stager, PhD, ANP-BC [facility]

## 2024-08-29 NOTE — Progress Notes (Signed)
 Gastroenterology Office Note     Primary Care Physician:  Jeanette Comer BRAVO, PA-C  Primary Gastroenterologist: Dr. Cindie   Chief Complaint   Chief Complaint  Patient presents with   Follow-up    Follow up on GERD, pt just picked up samples during this visit so she needs to take this for constipation     History of Present Illness   Cindy Stevenson is a 48 y.o. female presenting today with a history of hemorrhoids s/p banding, constipation, intermittent N/V/D, abdominal pain s/p cholecystectomy located pinpoint in right upper quadrant, dysphagia s/p EGD April 2025 with dilation of Schatzki ring, abdominal wall lipoma s/p excision returning for follow-up.   Constipation: has been taking stool softeners and BM X 2. Feels backed up. Trulance  no longer covered by manufacturer not participating in rebate program. Would have very loose stool, watery. Would try to go without Trulance  and would have to strain.   Will try Ibsrela . She has just picked this up today. We haven't tried motegrity .   Had rectal bleeding, feels right-side prolapsing. Feels swelling in rectum.    Imaging:  Dec 2024 Outside CT IMPRESSION:  No acute findings within the abdomen or pelvis. Few liver cyst stable, benign. Gallbladder absent, normal CBD   3.4 cm benign-appearing left ovarian cyst, decreased in size  compared to previous study. No follow-up imaging recommended.  Reference: JACR 2020 Feb; 17(2):248-254   Tiny hiatal hernia.    GES normal Feb 2025   Endoscopic:    EGD April 2025: mild Schatzki ring s/p dilation, gastritis s/p biopsy, gastric polyps X 4 resected, normal duodenum. Path: negative H.pylori. hyperplastic gastric polyps. Recommend EGD in 2-3 years.      EGD June 2023: mild Schatzki's ring s/p dilation, 2 gastric hyperplastic polyps.  Colonoscopy June 2023: Hemorrhoids on perianal exam, internal hemorrhoids, otherwise normal exam. Repeat in 10 years.      Past Medical  History:  Diagnosis Date   Anxiety    Breast nodule 08/06/2015   Breast pain, left 08/06/2015   Burning with urination 02/27/2014   Common migraine with intractable migraine 06/11/2018   Depression    Diabetes mellitus without complication (HCC)    Dysmenorrhea 02/27/2014   Fibroadenoma of right breast 08/26/2015   GERD (gastroesophageal reflux disease)    Headache    Hematuria 02/27/2014   Hypertension    PTSD (post-traumatic stress disorder)    Screening for STD (sexually transmitted disease) 03/27/2014   Seasonal allergies    SUI (stress urinary incontinence, female) 02/27/2014   Vaginal discharge 08/06/2015   Yeast infection 08/06/2015    Past Surgical History:  Procedure Laterality Date   BALLOON DILATION N/A 03/24/2022   Procedure: BALLOON DILATION;  Surgeon: Cindie Carlin POUR, DO;  Location: [facility];  Service: Endoscopy;  Laterality: N/A;   BILATERAL SALPINGECTOMY Bilateral 05/09/2017   Procedure: BILATERAL SALPINGECTOMY;  Surgeon: Edsel Norleen GAILS, MD;  Location: [facility];  Service: Gynecology;  Laterality: Bilateral;   CHOLECYSTECTOMY  03/2023   COLONOSCOPY N/A 09/10/2013   DOQ:wnmfj;/dfjoo internal hemorrhoids   COLONOSCOPY WITH PROPOFOL  N/A 03/24/2022   Surgeon: Cindie Carlin POUR, DO;   Hemorrhoids on perianal exam, internal hemorrhoids, otherwise normal exam.  Repeat in 10 years.   ESOPHAGEAL DILATION N/A 02/05/2024   Procedure: DILATION, ESOPHAGUS;  Surgeon: Cindie Carlin POUR, DO;  Location: [facility];  Service: Endoscopy;  Laterality: N/A;  9:15 am, asa 3   ESOPHAGOGASTRODUODENOSCOPY N/A 09/10/2013   DOQ:dryjusxp ring at the  gastroesophagral juctions/mild non-erosive gastritis   ESOPHAGOGASTRODUODENOSCOPY  2017   Dr. Donnel; Schatzki's ring in the distal esophagus, otherwise normal exam.   ESOPHAGOGASTRODUODENOSCOPY N/A 02/05/2024   Procedure: EGD (ESOPHAGOGASTRODUODENOSCOPY);  Surgeon: Cindie Carlin POUR, DO;  [facility]: [facility];  Service: Endoscopy;   Laterality: N/A;  9:15 am, asa 3   ESOPHAGOGASTRODUODENOSCOPY (EGD) WITH PROPOFOL  N/A 03/24/2022   Surgeon: Cindie Carlin POUR, DO;   Mild Schatzki's ring dilated, 2 gastric polyps removed, otherwise normal exam. Polyps were hyperplastic.   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   Procedure: EXCISION, NEOPLASM, ABDOMINAL WALL;  Surgeon: Evonnie Dorothyann LABOR, DO;  Location: [facility];  Service: General;  Laterality: Right;   LABIOPLASTY  02/14/2012   Procedure: LABIAPLASTY;  Surgeon: Norleen LULLA Server, MD;  Location: [facility];  Service: Gynecology;  Laterality: Right;  of the right labia minora   POLYPECTOMY  03/24/2022   Procedure: POLYPECTOMY;  Surgeon: Cindie Carlin POUR, DO;  [facility]: [facility];  Service: Endoscopy;;   SUPRACERVICAL ABDOMINAL HYSTERECTOMY N/A 05/09/2017   Procedure: HYSTERECTOMY SUPRACERVICAL ABDOMINAL;  Surgeon: Server Norleen LULLA, MD;  Location: [facility];  Service: Gynecology;  Laterality: N/A;   WISDOM TOOTH EXTRACTION  10/18/1995   Dr. Delores    Current Outpatient Medications  Medication Sig Dispense Refill   ACCU-CHEK GUIDE test strip      Accu-Chek Softclix Lancets lancets SMARTSIG:Topical     albuterol  (VENTOLIN  HFA) 108 (90 Base) MCG/ACT inhaler Inhale 2 puffs into the lungs every 6 (six) hours as needed for shortness of breath or wheezing. 8 g 1   ALPRAZolam (XANAX) 1 MG tablet Take 1 mg by mouth 3 (three) times daily.     amphetamine-dextroamphetamine (ADDERALL XR) 15 MG 24 hr capsule Take 15 mg by mouth every morning.     azelastine  (ASTELIN ) 0.1 % nasal spray Place 2 sprays into both nostrils 2 (two) times daily as needed. Use in each nostril as directed 30 mL 5   baclofen  (LIORESAL ) 10 MG tablet Take 1 tablet (10 mg total) by mouth daily as needed (migraine). 15 each 11   budesonide -formoterol  (SYMBICORT ) 160-4.5 MCG/ACT inhaler Inhale 2 puffs into the lungs 2 (two) times daily. 1 each 5   cetirizine  (ZYRTEC ) 10 MG tablet Take 1 tablet (10 mg total) by  mouth daily. 90 tablet 1   cholecalciferol (VITAMIN D3) 25 MCG (1000 UNIT) tablet Take 1,000 Units by mouth in the morning.     Diclofenac  Potassium,Migraine, 50 MG PACK TAKE 50-100 MG BY MOUTH AS NEEDED FOR MIGRAINE (Max Daily Dose: 100 MG in 24 HOURS) 10 each 11   docusate sodium  (COLACE) 100 MG capsule Take 100-200 mg by mouth 2 (two) times daily as needed for mild constipation.     EPINEPHrine  0.3 mg/0.3 mL IJ SOAJ injection Inject 0.3 mg into the muscle as needed for anaphylaxis. 2 each 1   Eptinezumab -jjmr (VYEPTI ) 100 MG/ML injection Inject 300 mg into the vein every 3 (three) months. Getting at [facility] [phone], fax [phone]     esomeprazole (NEXIUM) 40 MG capsule Take 40 mg by mouth 2 (two) times daily.      fluticasone  (FLONASE ) 50 MCG/ACT nasal spray Place 2 sprays into both nostrils daily. 16 g 5   gabapentin  (NEURONTIN ) 400 MG capsule Take 400 mg by mouth 4 (four) times daily.     hydrochlorothiazide  (HYDRODIURIL ) 25 MG tablet Take 25 mg by mouth in the morning.     ibuprofen  (ADVIL ) 600 MG tablet Take  600 mg by mouth every 8 (eight) hours as needed for headache.     labetalol (NORMODYNE) 100 MG tablet Take 50 mg by mouth 2 (two) times daily.     melatonin 3 MG TABS tablet Take 3-6 mg by mouth at bedtime.     naratriptan  (AMERGE) 2.5 MG tablet Take 1 tablet (2.5 mg total) by mouth as needed for migraine. Take one (1) tablet at onset of headache; if returns or does not resolve, may repeat after 4 hours; do not exceed five (5) mg in 24 hours. 12 tablet 11   nystatin (MYCOSTATIN/NYSTOP) powder Apply 1 Application topically 2 (two) times daily as needed (skin irritation.).     nystatin-triamcinolone  (MYCOLOG II) cream Apply 1 Application topically 2 (two) times daily as needed (skin irritation.).     Omega-3 Fatty Acids (FISH OIL PO) Take 2,000 mg by mouth in the morning.     ondansetron  (ZOFRAN ) 8 MG tablet Take 8 mg by mouth every 8 (eight) hours as needed for nausea.      PROCTO-MED HC  2.5 % rectal cream APPLY 1 GRAM RECTALLY TWICE DAILY 30 g 1   PROCTO-MED HC  2.5 % rectal cream PLACE 1 APPLICATION RECTALLY TWICE DAILY 30 g 1   RESTASIS 0.05 % ophthalmic emulsion      Rimegepant Sulfate (NURTEC) 75 MG TBDP Take 1 tablet (75 mg total) by mouth as needed (take 1 tablet at onset of migraine. (Do not exceed more than 1 tablet in 24 hours)). 10 tablet 11   sucralfate  (CARAFATE ) 1 GM/10ML suspension TAKE 2 TEASPOONSFUL (10ML) BY MOUTH FOUR TIMES DAILY FOR ABDOMINAL BURNING AS NEEDED 414 mL 1   tiZANidine  (ZANAFLEX ) 2 MG tablet TAKE 1 TABLET BY MOUTH DAILY AS NEEDED FOR HEADACHE. 30 tablet 11   Bismuth Subsalicylate (PEPTO-BISMOL) 262 MG TABS Take 262 mg by mouth daily as needed (indigestion). (Patient not taking: Reported on 08/29/2024)     lisdexamfetamine (VYVANSE) 20 MG capsule Take 20 mg by mouth daily. (Patient not taking: Reported on 08/29/2024)     Tenapanor HCl (IBSRELA ) 50 MG TABS Take 50 mg by mouth 2 (two) times daily before a meal.     No current facility-administered medications for this visit.    Allergies as of 08/29/2024 - Review Complete 08/29/2024  Allergen Reaction Noted   Effexor  [venlafaxine ] Rash and Hypertension 07/12/2021   Latex Dermatitis 05/09/2016   Codeine Nausea And Vomiting 02/13/2012   Shellfish allergy  Hives 02/13/2012   Sulfa antibiotics Swelling 07/18/2022   Tape  03/27/2023   Amoxicillin Rash 01/23/2015   Ceftin [cefuroxime] Rash 12/09/2020    Family History  Problem Relation Age of Onset   Lung cancer Mother        smoked   Asthma Sister    Allergies Sister    Diabetes Maternal Grandmother    Cancer Maternal Grandfather        skin   Mental illness Maternal Aunt        depression, PTSD   Colon cancer Neg Hx     Social History   Socioeconomic History   Marital status: Single    Spouse name: Not on file   Number of children: Not on file   Years of education: Not on file   Highest education level: Not on  file  Occupational History   Occupation: disability    Employer: NOT EMPLOYED  Tobacco Use   Smoking status: Never    Passive exposure: Past   Smokeless tobacco: Never  Vaping  Use   Vaping status: Never Used  Substance and Sexual Activity   Alcohol use: No   Drug use: No   Sexual activity: Not Currently    Birth control/protection: Surgical    Comment: [facility]  Other Topics Concern   Not on file  Social History Narrative   Not on file   Social Drivers of Health   Financial Resource Strain: Low Risk (11/24/2021)   Received from [facility]   Overall Financial Resource Strain (CARDIA)    Difficulty of Paying Living Expenses: Not hard at all  Food Insecurity: No Food Insecurity (01/30/2024)   Received from [facility]   Hunger Vital Sign    Within the past 12 months, you worried that your food would run out before you got the money to buy more.: Never true    Within the past 12 months, the food you bought just didn't last and you didn't have money to get more.: Never true  Transportation Needs: No Transportation Needs (11/24/2021)   Received from [facility] - Transportation    Lack of Transportation (Medical): No    Lack of Transportation (Non-Medical): No  Physical Activity: Inactive (08/09/2021)   Received from [facility]   Exercise Vital Sign    On average, how many days per week do you engage in moderate to strenuous exercise (like a brisk walk)?: 0 days    On average, how many minutes do you engage in exercise at this level?: 0 min  Stress: Stress Concern Present (08/09/2021)   Received from [facility] - Occupational Stress Questionnaire    Feeling of Stress : Very much  Social Connections: Unknown (02/15/2022)   Received from [facility]   Social Network    Social Network: Not on file  Recent Concern: Social Connections - Socially Isolated (11/24/2021)   Received from [facility]   Social  Connection and Isolation Panel    In a typical week, how many times do you talk on the phone with family, friends, or neighbors?: More than three times a week    How often do you get together with friends or relatives?: Once a week    How often do you attend church or religious services?: Never    Do you belong to any clubs or organizations such as church groups, unions, fraternal or athletic groups, or school groups?: No    How often do you attend meetings of the clubs or organizations you belong to?: Never    Are you married, widowed, divorced, separated, never married, or living with a partner?: Never married  Intimate Partner Violence: Not At Risk (01/30/2024)   Received from [facility]   Humiliation, Afraid, Rape, and Kick questionnaire    Within the last year, have you been afraid of your partner or ex-partner?: No    Within the last year, have you been humiliated or emotionally abused in other ways by your partner or ex-partner?: No    Within the last year, have you been kicked, hit, slapped, or otherwise physically hurt by your partner or ex-partner?: No    Within the last year, have you been raped or forced to have any kind of sexual activity by your partner or ex-partner?: No     Review of Systems   Gen: Denies any fever, chills, fatigue, weight loss, lack of appetite.  CV: Denies chest pain, heart palpitations, peripheral edema, syncope.  Resp: Denies shortness of breath at rest or with exertion. Denies wheezing or cough.  GI: Denies dysphagia or odynophagia. Denies jaundice, hematemesis, fecal incontinence. GU : Denies urinary burning, urinary frequency, urinary hesitancy MS: Denies joint pain, muscle weakness, cramps, or limitation of movement.  Derm: Denies rash, itching, dry skin Psych: Denies depression, anxiety, memory loss, and confusion Heme: Denies bruising, bleeding, and enlarged lymph nodes.   Physical Exam   BP 130/82   Pulse (!) 109   Temp 98.4 F (36.9  C)   Ht 5' 1 (1.549 m)   Wt 210 lb 3.2 oz (95.3 kg)   LMP 04/21/2017 Comment: [facility]  BMI 39.72 kg/m  General:   Alert and oriented. Pleasant and cooperative. Well-nourished and well-developed.  Head:  Normocephalic and atraumatic. Eyes:  Without icterus Rectal:  large external hemorrhoids, hypertrophic skin tag like areas on right side,  Msk:  Symmetrical without gross deformities. Normal posture. Extremities:  Without edema. Neurologic:  Alert and  oriented x4;  grossly normal neurologically. Skin:  Intact without significant lesions or rashes. Psych:  Alert and cooperative. Normal mood and affect.   Assessment/Plan   Constipation: trulance  no longer covered. Trial Ibsrela . If on improvement, trial motegrity .  Large external hemorrhoids: with hypertrophic skin tag areas also on the external hemorrhoids, unclear if this is simply r/t chronic hemorrhoids and I am unable to rule out any very small condylomas that are difficult to see in light of how large external hemorrhoids have become. Recommend referral to Gen Surg for external hemorrhoid repair as internal hemorrhoid banding will not be helpful in this case.    Motegrity  trial after Ibsrela .   6 week follow-up.    Therisa MICAEL Stager, PhD, ANP-BC [facility]

## 2024-09-03 MED ORDER — PRUCALOPRIDE SUCCINATE 2 MG PO TABS: 1.0000 | ORAL_TABLET | Freq: Every day | ORAL | 5 refills | Status: AC

## 2024-09-04 ENCOUNTER — Ambulatory Visit (INDEPENDENT_AMBULATORY_CARE_PROVIDER_SITE_OTHER)

## 2024-09-04 DIAGNOSIS — J309 Allergic rhinitis, unspecified: Secondary | ICD-10-CM

## 2024-09-04 NOTE — Progress Notes (Signed)
 Immunotherapy   Patient Details  Name: Cindy Stevenson MRN: [id number] Date of Birth: [birth year]  09/04/2024  [name] started injections for  G-T-W Following schedule: B  Frequency:2 times per week Epi-Pen:Epi-Pen Available  Consent signed and patient instructions given. Patient waited in office for 30 minutes.   [name] E [name] 09/04/2024, 4:10 PM

## 2024-09-06 ENCOUNTER — Ambulatory Visit (INDEPENDENT_AMBULATORY_CARE_PROVIDER_SITE_OTHER): Payer: Self-pay

## 2024-09-06 ENCOUNTER — Telehealth: Payer: Self-pay | Admitting: Allergy & Immunology

## 2024-09-06 DIAGNOSIS — J309 Allergic rhinitis, unspecified: Secondary | ICD-10-CM

## 2024-09-06 NOTE — Telephone Encounter (Signed)
 The patient presents with concerns about facial flushing and dizziness following her injections. She received 0.1 mL of the Silver Vial. This is her second shot.   She experiences facial flushing and a sensation of heat in her cheeks, described as 'burning'. This occurs randomly and is not associated with dizziness. She has a history of rosacea and she does have some flushing of her face consistent with rosacea.   She describes a recent episode of dizziness and facial flushing after standing up too quickly. She is unsure if she took her usual medication, Zyrtec , that morning, which she takes daily for allergies.  No chest pain, difficulty breathing, or tightness in the chest. She confirms that she has been able to drive without issues despite these symptoms.  She feels that she is fine to leave. Hadassah took vitals and everything is within normal limits, including her BP which is a bit elevated compared to previous readings. But on exam aside from the facial flushing. Oraopharynx is clear and she is moving air well in all lung fields.   Marty Shaggy, MD [facility] 

## 2024-09-09 ENCOUNTER — Telehealth: Payer: Self-pay

## 2024-09-09 NOTE — Telephone Encounter (Signed)
Noted. Awesome :)

## 2024-09-09 NOTE — Telephone Encounter (Signed)
 Pt approved for Motegrity  2mg . Will scan to the pt's chart under media. Pt has been advised of this as well and Therisa Stager, NP

## 2024-09-09 NOTE — Telephone Encounter (Signed)
 Can we please look into this and let patient know? Thanks!

## 2024-09-11 ENCOUNTER — Ambulatory Visit (INDEPENDENT_AMBULATORY_CARE_PROVIDER_SITE_OTHER)

## 2024-09-11 DIAGNOSIS — J309 Allergic rhinitis, unspecified: Secondary | ICD-10-CM

## 2024-09-15 ENCOUNTER — Telehealth: Payer: Self-pay | Admitting: Gastroenterology

## 2024-09-15 NOTE — Telephone Encounter (Signed)
 Please send referral for large external hemorrhoids to Dr Kallie specifically. Thank you!

## 2024-09-16 NOTE — Telephone Encounter (Signed)
 Referral sent to [facility]. Surgical, Manuelita Pander specifically

## 2024-09-25 ENCOUNTER — Ambulatory Visit (INDEPENDENT_AMBULATORY_CARE_PROVIDER_SITE_OTHER)

## 2024-09-25 DIAGNOSIS — J309 Allergic rhinitis, unspecified: Secondary | ICD-10-CM

## 2024-09-27 ENCOUNTER — Other Ambulatory Visit (HOSPITAL_COMMUNITY): Payer: Self-pay

## 2024-09-27 ENCOUNTER — Ambulatory Visit

## 2024-09-27 ENCOUNTER — Telehealth: Payer: Self-pay

## 2024-09-27 DIAGNOSIS — J309 Allergic rhinitis, unspecified: Secondary | ICD-10-CM | POA: Diagnosis not present

## 2024-09-27 NOTE — Telephone Encounter (Signed)
 Pharmacy Patient Advocate Encounter  Received notification from [employer] [facility] MEDICAID that Prior Authorization for Diclofenac  has been APPROVED from 09/27/2024 to 09/27/2025   PA #/Case ID/Reference #: [id number]

## 2024-09-27 NOTE — Telephone Encounter (Signed)
 Pharmacy Patient Advocate Encounter   Received notification from CoverMyMeds that prior authorization for Diclofenac  Powder is required/requested.   Insurance verification completed.   The patient is insured through [facility] MEDICAID.   Per test claim: PA required; PA submitted to above mentioned insurance via Latent Key/confirmation #/EOC [id number] Status is pending

## 2024-10-02 ENCOUNTER — Other Ambulatory Visit: Payer: Self-pay | Admitting: Gastroenterology

## 2024-10-03 ENCOUNTER — Other Ambulatory Visit (HOSPITAL_COMMUNITY): Payer: Self-pay

## 2024-10-07 ENCOUNTER — Ambulatory Visit

## 2024-10-07 DIAGNOSIS — J309 Allergic rhinitis, unspecified: Secondary | ICD-10-CM

## 2024-10-08 ENCOUNTER — Telehealth: Payer: Self-pay | Admitting: Adult Health

## 2024-10-08 ENCOUNTER — Ambulatory Visit: Admitting: Adult Health

## 2024-10-08 NOTE — Progress Notes (Deleted)
 "  CC:  headaches No chief complaint on file.    Follow-up Visit  Last visit: 03/26/2024  Brief HPI: 48 year old female with a history of DM who follows in clinic for chronic migraines and occipital neuralgia.  At her last visit, continued Vyepti  infusions for prevention and naratriptan , baclofen  and Cambia  PRN for rescue and recommended trial of Nurtec for rescue due to inconsistent benefit with naratriptan      Interval History:   Returns today for follow-up visit to discuss migraine headaches.  Currently experiencing about 3-4 severe migraines per month but continued rather frequent tension type headaches.  She is still running out of naratriptan  before the end of the month, typically works well but some times will not abort migraine.  She will occasionally alternate Tylenol  or ibuprofen  but denies significant benefit, denies overuse.  Use of tizanidine  or baclofen  occasionally with headache with benefit, she does not take together.  Limited use of diclofenac  powder as she has noticed toe cramping shortly after taking.  She is scheduled to see pulmonology this week with plans on pursuing sleep study to look for underlying sleep apnea.  She was seen by orthopedics and underwent 2 sets of bilateral C2-C3 facet joint blocks with significant relief of pain.  She underwent radiofrequency ablation of bilateral C2-3 cervical facet joints on 02/20/2024 by Dr. Ethyl. Reports significant benefit since then, does have very mild symptoms but not overly bothersome.      Current Headache Regimen: Preventative: Vyepti  300 mg every 3 months Abortive: naratriptan  2.5 mg PRN, diclofenac , baclofen , tizanidine    Prior Therapies                                  Preventive: Prozac Paxil Cymbalta  Amitriptyline Topamax Gabapentin  400 mg TID Metoprolol Lisinopril  losartan Ajovy  Aimovig Emgality  Vyepti  300 mg  Botox  - worsened neck pain Occipital nerve block Neck PT   Rescue: Naratriptan   2.5 mg PRN Maxalt - lack of efficacy Imitrex  - lack of efficacy Nurtec - lack of efficacy Ubrelvy  - lack of efficacy Zavzpret  - lack of efficacy Diclofenac   - stomach upset Baclofen  Flexeril    Current Outpatient Medications on File Prior to Visit  Medication Sig Dispense Refill   ACCU-CHEK GUIDE test strip      Accu-Chek Softclix Lancets lancets SMARTSIG:Topical     albuterol  (VENTOLIN  HFA) 108 (90 Base) MCG/ACT inhaler Inhale 2 puffs into the lungs every 6 (six) hours as needed for shortness of breath or wheezing. 8 g 1   ALPRAZolam (XANAX) 1 MG tablet Take 1 mg by mouth 3 (three) times daily.     amphetamine-dextroamphetamine (ADDERALL XR) 15 MG 24 hr capsule Take 15 mg by mouth every morning.     azelastine  (ASTELIN ) 0.1 % nasal spray Place 2 sprays into both nostrils 2 (two) times daily as needed. Use in each nostril as directed 30 mL 5   baclofen  (LIORESAL ) 10 MG tablet Take 1 tablet (10 mg total) by mouth daily as needed (migraine). 15 each 11   Bismuth Subsalicylate (PEPTO-BISMOL) 262 MG TABS Take 262 mg by mouth daily as needed (indigestion). (Patient not taking: Reported on 08/29/2024)     budesonide -formoterol  (SYMBICORT ) 160-4.5 MCG/ACT inhaler Inhale 2 puffs into the lungs 2 (two) times daily. 1 each 5   cetirizine  (ZYRTEC ) 10 MG tablet Take 1 tablet (10 mg total) by mouth daily. 90 tablet 1   cholecalciferol (VITAMIN D3) 25 MCG (  1000 UNIT) tablet Take 1,000 Units by mouth in the morning.     Diclofenac  Potassium,Migraine, 50 MG PACK TAKE 50-100 MG BY MOUTH AS NEEDED FOR MIGRAINE (Max Daily Dose: 100 MG in 24 HOURS) 10 each 11   docusate sodium  (COLACE) 100 MG capsule Take 100-200 mg by mouth 2 (two) times daily as needed for mild constipation.     EPINEPHrine  0.3 mg/0.3 mL IJ SOAJ injection Inject 0.3 mg into the muscle as needed for anaphylaxis. 2 each 1   Eptinezumab -jjmr (VYEPTI ) 100 MG/ML injection Inject 300 mg into the vein every 3 (three) months. Getting at [facility] [phone], fax [phone]     esomeprazole (NEXIUM) 40 MG capsule Take 40 mg by mouth 2 (two) times daily.      fluticasone  (FLONASE ) 50 MCG/ACT nasal spray Place 2 sprays into both nostrils daily. 16 g 5   gabapentin  (NEURONTIN ) 400 MG capsule Take 400 mg by mouth 4 (four) times daily.     hydrochlorothiazide  (HYDRODIURIL ) 25 MG tablet Take 25 mg by mouth in the morning.     ibuprofen  (ADVIL ) 600 MG tablet Take 600 mg by mouth every 8 (eight) hours as needed for headache.     labetalol (NORMODYNE) 100 MG tablet Take 50 mg by mouth 2 (two) times daily.     lisdexamfetamine (VYVANSE) 20 MG capsule Take 20 mg by mouth daily. (Patient not taking: Reported on 08/29/2024)     melatonin 3 MG TABS tablet Take 3-6 mg by mouth at bedtime.     naratriptan  (AMERGE) 2.5 MG tablet Take 1 tablet (2.5 mg total) by mouth as needed for migraine. Take one (1) tablet at onset of headache; if returns or does not resolve, may repeat after 4 hours; do not exceed five (5) mg in 24 hours. 12 tablet 11   nystatin (MYCOSTATIN/NYSTOP) powder Apply 1 Application topically 2 (two) times daily as needed (skin irritation.).     nystatin-triamcinolone  (MYCOLOG II) cream Apply 1 Application topically 2 (two) times daily as needed (skin irritation.).     Omega-3 Fatty Acids (FISH OIL PO) Take 2,000 mg by mouth in the morning.     ondansetron  (ZOFRAN ) 8 MG tablet Take 8 mg by mouth every 8 (eight) hours as needed for nausea.     PROCTO-MED HC  2.5 % rectal cream APPLY 1 GRAM RECTALLY TWICE DAILY 30 g 1   PROCTO-MED HC  2.5 % rectal cream PLACE 1 APPLICATION RECTALLY TWICE DAILY 30 g 1   Prucalopride Succinate  (MOTEGRITY ) 2 MG TABS Take 1 tablet (2 mg total) by mouth daily. With or without food. 30 tablet 5   RESTASIS 0.05 % ophthalmic emulsion      Rimegepant Sulfate (NURTEC) 75 MG TBDP Take 1 tablet (75 mg total) by mouth as needed (take 1 tablet at onset of migraine. (Do not exceed more than 1 tablet in 24 hours)). 10  tablet 11   sucralfate  (CARAFATE ) 1 GM/10ML suspension TAKE 2 TEASPOONSFUL (10ML) BY MOUTH FOUR TIMES DAILY FOR ABDOMINAL BURNING AS NEEDED 414 mL 1   Tenapanor HCl (IBSRELA ) 50 MG TABS Take 50 mg by mouth 2 (two) times daily before a meal.     tiZANidine  (ZANAFLEX ) 2 MG tablet TAKE 1 TABLET BY MOUTH DAILY AS NEEDED FOR HEADACHE. 30 tablet 11   No current facility-administered medications on file prior to visit.   Past Medical History:  Diagnosis Date   Anxiety    Breast nodule 08/06/2015   Breast pain, left 08/06/2015  Burning with urination 02/27/2014   Common migraine with intractable migraine 06/11/2018   Depression    Diabetes mellitus without complication (HCC)    Dysmenorrhea 02/27/2014   Fibroadenoma of right breast 08/26/2015   GERD (gastroesophageal reflux disease)    Headache    Hematuria 02/27/2014   Hypertension    PTSD (post-traumatic stress disorder)    Screening for STD (sexually transmitted disease) 03/27/2014   Seasonal allergies    SUI (stress urinary incontinence, female) 02/27/2014   Vaginal discharge 08/06/2015   Yeast infection 08/06/2015   Past Surgical History:  Procedure Laterality Date   BALLOON DILATION N/A 03/24/2022   Procedure: BALLOON DILATION;  Surgeon: Cindie Carlin POUR, DO;  [facility]: [facility];  Service: Endoscopy;  Laterality: N/A;   BILATERAL SALPINGECTOMY Bilateral 05/09/2017   Procedure: BILATERAL SALPINGECTOMY;  Surgeon: Edsel Norleen GAILS, MD;  Location: [facility];  Service: Gynecology;  Laterality: Bilateral;   CHOLECYSTECTOMY  03/2023   COLONOSCOPY N/A 09/10/2013   DOQ:wnmfj;/dfjoo internal hemorrhoids   COLONOSCOPY WITH PROPOFOL  N/A 03/24/2022   Surgeon: Cindie Carlin POUR, DO;   Hemorrhoids on perianal exam, internal hemorrhoids, otherwise normal exam.  Repeat in 10 years.   ESOPHAGEAL DILATION N/A 02/05/2024   Procedure: DILATION, ESOPHAGUS;  Surgeon: Cindie Carlin POUR, DO;  [facility]: [facility];  Service: Endoscopy;   Laterality: N/A;  9:15 am, asa 3   ESOPHAGOGASTRODUODENOSCOPY N/A 09/10/2013   DOQ:dryjusxp ring at the gastroesophagral juctions/mild non-erosive gastritis   ESOPHAGOGASTRODUODENOSCOPY  2017   Dr. Donnel; Schatzki's ring in the distal esophagus, otherwise normal exam.   ESOPHAGOGASTRODUODENOSCOPY N/A 02/05/2024   Procedure: EGD (ESOPHAGOGASTRODUODENOSCOPY);  Surgeon: Cindie Carlin POUR, DO;  [facility]: [facility];  Service: Endoscopy;  Laterality: N/A;  9:15 am, asa 3   ESOPHAGOGASTRODUODENOSCOPY (EGD) WITH PROPOFOL  N/A 03/24/2022   Surgeon: Cindie Carlin POUR, DO;   Mild Schatzki's ring dilated, 2 gastric polyps removed, otherwise normal exam. Polyps were hyperplastic.   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   Procedure: EXCISION, NEOPLASM, ABDOMINAL WALL;  Surgeon: Evonnie Dorothyann LABOR, DO;  Location: [facility];  Service: General;  Laterality: Right;   LABIOPLASTY  02/14/2012   Procedure: LABIAPLASTY;  Surgeon: Norleen GAILS Edsel, MD;  Location: [facility];  Service: Gynecology;  Laterality: Right;  of the right labia minora   POLYPECTOMY  03/24/2022   Procedure: POLYPECTOMY;  Surgeon: Cindie Carlin POUR, DO;  [facility]: [facility];  Service: Endoscopy;;   SUPRACERVICAL ABDOMINAL HYSTERECTOMY N/A 05/09/2017   Procedure: HYSTERECTOMY SUPRACERVICAL ABDOMINAL;  Surgeon: Edsel Norleen GAILS, MD;  Location: [facility];  Service: Gynecology;  Laterality: N/A;   WISDOM TOOTH EXTRACTION  10/18/1995   Dr. Delores          Physical Exam:   Vital Signs: LMP 04/21/2017 Comment: [facility] GENERAL:  well appearing, in no acute distress, alert  SKIN:  Color, texture, turgor normal. No rashes or lesions HEAD:  Normocephalic/atraumatic. RESP: normal respiratory effort MSK:  No gross joint deformities.   NEUROLOGICAL: Mental Status: Alert, oriented to person, place and time, Follows commands, and Speech fluent and appropriate. Cranial Nerves: PERRL, face symmetric, no dysarthria, hearing grossly  intact Motor: moves all extremities equally Gait: normal-based.     IMPRESSION: 48 year old female with a history of DM who presents for follow up of chronic migraines. She has noticed some improvement with Vyepti  300 mg every 3 months with current migraines about 3-4/month.  Continues to have frequent tension type headaches, still suspect component of analgesic rebound  with frequent triptan use. S/p ablation 02/2024 for occipital neuralgia with almost complete resolution of pain.     PLAN: -Preventive: Continue Vyepti  300 mg every 3 months -Rescue: Continue naratriptan  2.5 PRN, baclofen  OR tizanidine , try Nurtec as needed for rescue, samples provided, will call if beneficial. Reports previously tried but as migraines more controlled now she questions if it may be helpful -Discussed importance of limiting rescue medications and OTC pain relievers to no more than 2-3 times per week due to rebound headache.  She was encouraged to use these medications only for more severe migraine.  -f/u with pulmonology this week for sleep apnea evaluation    Follow-up in 6 months or call earlier if needed      I personally spent a total of 25 minutes in the care of the patient today including preparing to see the patient, performing a medically appropriate exam/evaluation, counseling and educating, placing orders, and documenting clinical information in the EHR.   Harlene Bogaert, AGNP-BC  [facility] [address] [place], [place] [address]  Phone [phone] Fax [phone] Note: This document was prepared with digital dictation and possible smart phrase technology. Any transcriptional errors that result from this process are unintentional.   "

## 2024-10-08 NOTE — Telephone Encounter (Signed)
" °  Pt came into office, was 15 minutes late for appt rescheduled appt. States she needs refills of  naratriptan  (AMERGE) 2.5 MG tablet  baclofen  (LIORESAL ) 10 MG tablet   Diclofenac  Potassium,Migraine, 50 MG PACK  at [facility]. - [place], [place] - [address]    "

## 2024-10-09 ENCOUNTER — Other Ambulatory Visit (HOSPITAL_COMMUNITY): Payer: Self-pay

## 2024-10-16 ENCOUNTER — Ambulatory Visit: Admitting: Gastroenterology

## 2024-10-16 ENCOUNTER — Encounter: Payer: Self-pay | Admitting: Gastroenterology

## 2024-10-16 DIAGNOSIS — R112 Nausea with vomiting, unspecified: Secondary | ICD-10-CM

## 2024-10-16 DIAGNOSIS — R1084 Generalized abdominal pain: Secondary | ICD-10-CM

## 2024-10-22 NOTE — Progress Notes (Unsigned)
 "  CC:  headaches No chief complaint on file.    Follow-up Visit  Last visit: 03/26/2024  Brief HPI: 49 year old female with a history of DM who follows in clinic for chronic migraines and occipital neuralgia.  At her last visit, continued Vyepti  infusions for prevention and naratriptan , baclofen  and Cambia  PRN for rescue and recommended trial of Nurtec for rescue due to inconsistent benefit with naratriptan      Interval History:   Returns today for follow-up visit to discuss migraine headaches.  Currently experiencing about 3-4 severe migraines per month but continued rather frequent tension type headaches.  She is still running out of naratriptan  before the end of the month, typically works well but some times will not abort migraine.  She will occasionally alternate Tylenol  or ibuprofen  but denies significant benefit, denies overuse.  Use of tizanidine  or baclofen  occasionally with headache with benefit, she does not take together.  Limited use of diclofenac  powder as she has noticed toe cramping shortly after taking.  She is scheduled to see pulmonology this week with plans on pursuing sleep study to look for underlying sleep apnea.  She was seen by orthopedics and underwent 2 sets of bilateral C2-C3 facet joint blocks with significant relief of pain.  She underwent radiofrequency ablation of bilateral C2-3 cervical facet joints on 02/20/2024 by Dr. Ethyl. Reports significant benefit since then, does have very mild symptoms but not overly bothersome.      Current Headache Regimen: Preventative: Vyepti  300 mg every 3 months Abortive: naratriptan  2.5 mg PRN, diclofenac , baclofen , tizanidine    Prior Therapies                                  Preventive: Prozac Paxil Cymbalta  Amitriptyline Topamax Gabapentin  400 mg TID Metoprolol Lisinopril  losartan Ajovy  Aimovig Emgality  Vyepti  300 mg  Botox  - worsened neck pain Occipital nerve block Neck PT   Rescue: Naratriptan   2.5 mg PRN Maxalt - lack of efficacy Imitrex  - lack of efficacy Nurtec - lack of efficacy Ubrelvy  - lack of efficacy Zavzpret  - lack of efficacy Diclofenac   - stomach upset Baclofen  Flexeril    Current Outpatient Medications on File Prior to Visit  Medication Sig Dispense Refill   ACCU-CHEK GUIDE test strip      Accu-Chek Softclix Lancets lancets SMARTSIG:Topical     albuterol  (VENTOLIN  HFA) 108 (90 Base) MCG/ACT inhaler Inhale 2 puffs into the lungs every 6 (six) hours as needed for shortness of breath or wheezing. 8 g 1   ALPRAZolam (XANAX) 1 MG tablet Take 1 mg by mouth 3 (three) times daily.     amphetamine-dextroamphetamine (ADDERALL XR) 15 MG 24 hr capsule Take 15 mg by mouth every morning.     azelastine  (ASTELIN ) 0.1 % nasal spray Place 2 sprays into both nostrils 2 (two) times daily as needed. Use in each nostril as directed 30 mL 5   baclofen  (LIORESAL ) 10 MG tablet Take 1 tablet (10 mg total) by mouth daily as needed (migraine). 15 each 11   Bismuth Subsalicylate (PEPTO-BISMOL) 262 MG TABS Take 262 mg by mouth daily as needed (indigestion). (Patient not taking: Reported on 08/29/2024)     budesonide -formoterol  (SYMBICORT ) 160-4.5 MCG/ACT inhaler Inhale 2 puffs into the lungs 2 (two) times daily. 1 each 5   cetirizine  (ZYRTEC ) 10 MG tablet Take 1 tablet (10 mg total) by mouth daily. 90 tablet 1   cholecalciferol (VITAMIN D3) 25 MCG (  1000 UNIT) tablet Take 1,000 Units by mouth in the morning.     Diclofenac  Potassium,Migraine, 50 MG PACK TAKE 50-100 MG BY MOUTH AS NEEDED FOR MIGRAINE (Max Daily Dose: 100 MG in 24 HOURS) 10 each 11   docusate sodium  (COLACE) 100 MG capsule Take 100-200 mg by mouth 2 (two) times daily as needed for mild constipation.     EPINEPHrine  0.3 mg/0.3 mL IJ SOAJ injection Inject 0.3 mg into the muscle as needed for anaphylaxis. 2 each 1   Eptinezumab -jjmr (VYEPTI ) 100 MG/ML injection Inject 300 mg into the vein every 3 (three) months. Getting at [facility] [phone], fax [phone]     esomeprazole (NEXIUM) 40 MG capsule Take 40 mg by mouth 2 (two) times daily.      fluticasone  (FLONASE ) 50 MCG/ACT nasal spray Place 2 sprays into both nostrils daily. 16 g 5   gabapentin  (NEURONTIN ) 400 MG capsule Take 400 mg by mouth 4 (four) times daily.     hydrochlorothiazide  (HYDRODIURIL ) 25 MG tablet Take 25 mg by mouth in the morning.     ibuprofen  (ADVIL ) 600 MG tablet Take 600 mg by mouth every 8 (eight) hours as needed for headache.     labetalol (NORMODYNE) 100 MG tablet Take 50 mg by mouth 2 (two) times daily.     lisdexamfetamine (VYVANSE) 20 MG capsule Take 20 mg by mouth daily. (Patient not taking: Reported on 08/29/2024)     melatonin 3 MG TABS tablet Take 3-6 mg by mouth at bedtime.     naratriptan  (AMERGE) 2.5 MG tablet Take 1 tablet (2.5 mg total) by mouth as needed for migraine. Take one (1) tablet at onset of headache; if returns or does not resolve, may repeat after 4 hours; do not exceed five (5) mg in 24 hours. 12 tablet 11   nystatin (MYCOSTATIN/NYSTOP) powder Apply 1 Application topically 2 (two) times daily as needed (skin irritation.).     nystatin-triamcinolone  (MYCOLOG II) cream Apply 1 Application topically 2 (two) times daily as needed (skin irritation.).     Omega-3 Fatty Acids (FISH OIL PO) Take 2,000 mg by mouth in the morning.     ondansetron  (ZOFRAN ) 8 MG tablet Take 8 mg by mouth every 8 (eight) hours as needed for nausea.     PROCTO-MED HC  2.5 % rectal cream APPLY 1 GRAM RECTALLY TWICE DAILY 30 g 1   PROCTO-MED HC  2.5 % rectal cream PLACE 1 APPLICATION RECTALLY TWICE DAILY 30 g 1   Prucalopride Succinate  (MOTEGRITY ) 2 MG TABS Take 1 tablet (2 mg total) by mouth daily. With or without food. 30 tablet 5   RESTASIS 0.05 % ophthalmic emulsion      Rimegepant Sulfate (NURTEC) 75 MG TBDP Take 1 tablet (75 mg total) by mouth as needed (take 1 tablet at onset of migraine. (Do not exceed more than 1 tablet in 24 hours)). 10  tablet 11   sucralfate  (CARAFATE ) 1 GM/10ML suspension TAKE 2 TEASPOONSFUL ( ) BY MOUTH FOUR TIMES DAILY FOR ABDOMINAL BURNING AS NEEDED 414 mL 1   Tenapanor HCl (IBSRELA ) 50 MG TABS Take 50 mg by mouth 2 (two) times daily before a meal.     tiZANidine  (ZANAFLEX ) 2 MG tablet TAKE 1 TABLET BY MOUTH DAILY AS NEEDED FOR HEADACHE. 30 tablet 11   No current facility-administered medications on file prior to visit.   Past Medical History:  Diagnosis Date   Anxiety    Breast nodule 08/06/2015   Breast pain, left 08/06/2015  Burning with urination 02/27/2014   Common migraine with intractable migraine 06/11/2018   Depression    Diabetes mellitus without complication (HCC)    Dysmenorrhea 02/27/2014   Fibroadenoma of right breast 08/26/2015   GERD (gastroesophageal reflux disease)    Headache    Hematuria 02/27/2014   Hypertension    PTSD (post-traumatic stress disorder)    Screening for STD (sexually transmitted disease) 03/27/2014   Seasonal allergies    SUI (stress urinary incontinence, female) 02/27/2014   Vaginal discharge 08/06/2015   Yeast infection 08/06/2015   Past Surgical History:  Procedure Laterality Date   BALLOON DILATION N/A 03/24/2022   Procedure: BALLOON DILATION;  Surgeon: Cindie Carlin POUR, DO;  Location: [facility];  Service: Endoscopy;  Laterality: N/A;   BILATERAL SALPINGECTOMY Bilateral 05/09/2017   Procedure: BILATERAL SALPINGECTOMY;  Surgeon: Edsel Norleen GAILS, MD;  Location: [facility];  Service: Gynecology;  Laterality: Bilateral;   CHOLECYSTECTOMY  03/2023   COLONOSCOPY N/A 09/10/2013   DOQ:wnmfj;/dfjoo internal hemorrhoids   COLONOSCOPY WITH PROPOFOL  N/A 03/24/2022   Surgeon: Cindie Carlin POUR, DO;   Hemorrhoids on perianal exam, internal hemorrhoids, otherwise normal exam.  Repeat in 10 years.   ESOPHAGEAL DILATION N/A 02/05/2024   Procedure: DILATION, ESOPHAGUS;  Surgeon: Cindie Carlin POUR, DO;  [facility]: [facility];  Service: Endoscopy;   Laterality: N/A;  9:15 am, asa 3   ESOPHAGOGASTRODUODENOSCOPY N/A 09/10/2013   DOQ:dryjusxp ring at the gastroesophagral juctions/mild non-erosive gastritis   ESOPHAGOGASTRODUODENOSCOPY  2017   Dr. Donnel; Schatzki's ring in the distal esophagus, otherwise normal exam.   ESOPHAGOGASTRODUODENOSCOPY N/A 02/05/2024   Procedure: EGD (ESOPHAGOGASTRODUODENOSCOPY);  Surgeon: Cindie Carlin POUR, DO;  [facility]: [facility];  Service: Endoscopy;  Laterality: N/A;  9:15 am, asa 3   ESOPHAGOGASTRODUODENOSCOPY (EGD) WITH PROPOFOL  N/A 03/24/2022   Surgeon: Cindie Carlin POUR, DO;   Mild Schatzki's ring dilated, 2 gastric polyps removed, otherwise normal exam. Polyps were hyperplastic.   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   Procedure: EXCISION, NEOPLASM, ABDOMINAL WALL;  Surgeon: Evonnie Dorothyann LABOR, DO;  Location: [facility];  Service: General;  Laterality: Right;   LABIOPLASTY  02/14/2012   Procedure: LABIAPLASTY;  Surgeon: Norleen GAILS Edsel, MD;  Location: [facility];  Service: Gynecology;  Laterality: Right;  of the right labia minora   POLYPECTOMY  03/24/2022   Procedure: POLYPECTOMY;  Surgeon: Cindie Carlin POUR, DO;  [facility]: [facility];  Service: Endoscopy;;   SUPRACERVICAL ABDOMINAL HYSTERECTOMY N/A 05/09/2017   Procedure: HYSTERECTOMY SUPRACERVICAL ABDOMINAL;  Surgeon: Edsel Norleen GAILS, MD;  Location: [facility];  Service: Gynecology;  Laterality: N/A;   WISDOM TOOTH EXTRACTION  10/18/1995   Dr. Delores          Physical Exam:   Vital Signs: LMP 04/21/2017 Comment: [facility] GENERAL:  well appearing, in no acute distress, alert  SKIN:  Color, texture, turgor normal. No rashes or lesions HEAD:  Normocephalic/atraumatic. RESP: normal respiratory effort MSK:  No gross joint deformities.   NEUROLOGICAL: Mental Status: Alert, oriented to person, place and time, Follows commands, and Speech fluent and appropriate. Cranial Nerves: PERRL, face symmetric, no dysarthria, hearing grossly  intact Motor: moves all extremities equally Gait: normal-based.     IMPRESSION: 49 year old female with a history of DM who presents for follow up of chronic migraines. She has noticed some improvement with Vyepti  300 mg every 3 months with current migraines about 3-4/month.  Continues to have frequent tension type headaches, still suspect component of analgesic rebound  with frequent triptan use. S/p ablation 02/2024 for occipital neuralgia with almost complete resolution of pain.     PLAN: -Preventive: Continue Vyepti  300 mg every 3 months -Rescue: Continue naratriptan  2.5 PRN, baclofen  OR tizanidine , try Nurtec as needed for rescue, samples provided, will call if beneficial. Reports previously tried but as migraines more controlled now she questions if it may be helpful -Discussed importance of limiting rescue medications and OTC pain relievers to no more than 2-3 times per week due to rebound headache.  She was encouraged to use these medications only for more severe migraine.  -f/u with pulmonology this week for sleep apnea evaluation    Follow-up in 6 months or call earlier if needed      I personally spent a total of 25 minutes in the care of the patient today including preparing to see the patient, performing a medically appropriate exam/evaluation, counseling and educating, placing orders, and documenting clinical information in the EHR.   Harlene Bogaert, AGNP-BC  [facility] [address] [place], [place] [address]  Phone [phone] Fax [phone] Note: This document was prepared with digital dictation and possible smart phrase technology. Any transcriptional errors that result from this process are unintentional.   "

## 2024-10-23 ENCOUNTER — Ambulatory Visit: Admitting: Adult Health

## 2024-10-23 ENCOUNTER — Encounter: Payer: Self-pay | Admitting: Adult Health

## 2024-10-23 VITALS — BP 141/94 | HR 103 | Ht 61.0 in | Wt 206.2 lb

## 2024-10-23 DIAGNOSIS — M5481 Occipital neuralgia: Secondary | ICD-10-CM | POA: Diagnosis not present

## 2024-10-23 DIAGNOSIS — G43719 Chronic migraine without aura, intractable, without status migrainosus: Secondary | ICD-10-CM

## 2024-10-23 NOTE — Patient Instructions (Addendum)
 Your Plan:  Continue Vyepti  300mg  infusion every 3 months  Try to limit use of rescue medications to no more than 2-3 times per week or 8 times per month  Try supplements as listed below to help with daily mild headaches     Follow up in 6 months or call earlier if needed       Thank you for coming to see us  at [facility]. I hope we have been able to provide you high quality care today.  You may receive a patient satisfaction survey over the next few weeks. We would appreciate your feedback and comments so that we may continue to improve ourselves and the health of our patients.   GENERAL HEADACHE INSTRUCTIONS Headache Preventive Treatment: Please keep in mind that it takes 4-6 weeks for the medication to start working well and 2-3 months at the appropriate dose before deciding if it will be useful or not. If it is not helping at all by this time, then we will discuss other medications to try. Supplements may take 3-6 months until you see full effect.    Natural supplements: Magnesium Oxide or Magnesium Glycinate 500 mg at bed (up to 800 mg daily) Coenzyme Q10 300 mg in AM Vitamin B2- 200 mg twice a day   Add 1 supplement at a time since even natural supplements can have undesirable side effects. You can sometimes buy supplements cheaper (especially Coenzyme Q10) at www.webmailguide.co.za or at [employer].   Vitamins and herbs that show potential:   Magnesium: Magnesium (250 mg twice a day or 500 mg at bed) has a relaxant effect on smooth muscles such as blood vessels. Individuals suffering from frequent or daily headache usually have low magnesium levels which can be increase with daily supplementation of 400-750 mg. Three trials found 40-90% average headache reduction  when used as a preventative. Magnesium also demonstrated the benefit in menstrually related migraine.  Magnesium is part of the messenger system in the serotonin cascade and it is a good muscle relaxant.   It is also useful for constipation which can be a side effect of other medications used to treat migraine. Good sources include nuts, whole grains, and tomatoes. Side Effects: loose stool/diarrhea Riboflavin (vitamin B 2) 200 mg twice a day. This vitamin assists nerve cells in the production of ATP a principal energy storing molecule.  It is necessary for many chemical reactions in the body.  There have been at least 3 clinical trials of riboflavin using 400 mg per day all of which suggested that migraine frequency can be decreased.  All 3 trials showed significant improvement in over half of migraine sufferers.  The supplement is found in bread, cereal, milk, meat, and poultry.  Most Americans get more riboflavin than the recommended daily allowance, however riboflavin deficiency is not necessary for the supplements to help prevent headache. Side effects: energizing, green urine   Coenzyme Q10: This is present in almost all cells in the body and is critical component for the conversion of energy.  Recent studies have shown that a nutritional supplement of CoQ10 can reduce the frequency of migraine attacks by improving the energy production of cells as with riboflavin.  Doses of 150 mg twice a day have been shown to be effective.   Melatonin: Increasing evidence shows correlation between melatonin secretion and headache conditions.  Melatonin supplementation has decreased headache intensity and duration.  It is widely used as a sleep aid.  Sleep is natures way of dealing with  migraine.  A dose of 3 mg is recommended to start for headaches including cluster headache. Higher doses up to 15 mg has been reviewed for use in Cluster headache and have been used. The rationale behind using melatonin for cluster is that many theories regarding the cause of Cluster headache center around the disruption of the normal circadian rhythm in the brain.  This helps restore the normal circadian rhythm.   Ginger: Ginger has a  small amount of antihistamine and anti-inflammatory action which may help headache.  It is primarily used for nausea and may aid in the absorption of other medications.  HEADACHE DIET: Foods and beverages which may trigger migraine Note that only 20% of headache patients are food sensitive. You will know if you are food sensitive if you get a headache consistently 20 minutes to 2 hours after eating a certain food. Only cut out a food if it causes headaches, otherwise you might remove foods you enjoy! What matters most for diet is to eat a well balanced healthy diet full of vegetables and low fat protein, and to not miss meals.   Chocolate, other sweets ALL cheeses except cottage and cream cheese Dairy products, yogurt, sour cream, ice cream Liver Meat extracts (Bovril, Marmite, meat tenderizers) Meats or fish which have undergone aging, fermenting, pickling or smoking. These include: Hotdogs,salami,Lox,sausage, mortadellas,smoked salmon, pepperoni, Pickled herring Pods of broad bean (English beans, Chinese pea pods, Italian (fava) beans, lima and navy beans Ripe avocado, ripe banana Yeast extracts or active yeast preparations such as Brewer's or Fleishman's (commercial bakes goods are permitted) Tomato based foods, pizza (lasagna, etc.)   MSG (monosodium glutamate) is disguised as many things; look for these common aliases: Monopotassium glutamate Autolysed yeast Hydrolysed protein Sodium caseinate flavorings all natural preservatives Nutrasweet   Avoid all other foods that convincingly provoke headaches.   Resources: The Dizzy Bluford Aid Your Headache Diet, migrainestrong.com  https://zamora-andrews.com/   Caffeine  and Migraine For patients that have migraine, caffeine  intake more than 3 days per week can lead to dependency and increased migraine frequency. I would recommend cutting back on your caffeine  intake as best you can. The  recommended amount of caffeine  is 200-300 mg daily, although migraine patients may experience dependency at even lower doses. While you may notice an increase in headache temporarily, cutting back will be helpful for headaches in the long run. For more information on caffeine  and migraine, visit: https://americanmigrainefoundation.org/resource-library/caffeine -and-migraine/   Headache Prevention Strategies:   1. Maintain a headache diary; learn to identify and avoid triggers.  - This can be a simple note where you log when you had a headache, associated symptoms, and medications used - There are several smartphone apps developed to help track migraines: Migraine Buddy, Migraine Monitor, Curelator N1-Headache App   Common triggers include: Emotional triggers: Emotional/Upset family or friends Emotional/Upset occupation Business reversal/success Anticipation anxiety Crisis-serious Post-crisis periodNew job/position   Physical triggers: Vacation Day Weekend Strenuous Exercise High Altitude Location New Move Menstrual Day Physical Illness Oversleep/Not enough sleep Weather changes Light: Photophobia or light sesnitivity treatment involves a balance between desensitization and reduction in overly strong input. Use dark polarized glasses outside, but not inside. Avoid bright or fluorescent light, but do not dim environment to the point that going into a normally lit room hurts. Consider FL-41 tint lenses, which reduce the most irritating wavelengths without blocking too much light.  These can be obtained at axonoptics.com or theraspecs.com Foods: see list above.   2. Limit use of acute treatments (over-the-counter  medications, triptans, etc.) to no more than 2 days per week or 10 days per month to prevent medication overuse headache (rebound headache).     3. Follow a regular schedule (including weekends and holidays): Don't skip meals. Eat a balanced diet. 8 hours of sleep  nightly. Minimize stress. Exercise 30 minutes per day. Being overweight is associated with a 5 times increased risk of chronic migraine. Keep well hydrated and drink 6-8 glasses of water  per day.   4. Initiate non-pharmacologic measures at the earliest onset of your headache. Rest and quiet environment. Relax and reduce stress. Breathe2Relax is a free app that can instruct you on    some simple relaxtion and breathing techniques. Http://Dawnbuse.com is a    free website that provides teaching videos on relaxation.  Also, there are  many apps that   can be downloaded for mindful relaxation.  An app called YOGA NIDRA will help walk you through mindfulness. Another app called Calm can be downloaded to give you a structured mindfulness guide with daily reminders and skill development. Headspace for guided meditation Mindfulness Based Stress Reduction Online Course: www.palousemindfulness.com Cold compresses.   5. Don't wait!! Take the maximum allowable dosage of prescribed medication at the first sign of migraine.   6. Compliance:  Take prescribed medication regularly as directed and at the first sign of a migraine.   7. Communicate:  Call your physician when problems arise, especially if your headaches change, increase in frequency/severity, or become associated with neurological symptoms (weakness, numbness, slurred speech, etc.).   8. Headache/pain management therapies: Consider various complementary methods, including medication, behavioral therapy, psychological counselling, biofeedback, massage therapy, acupuncture, dry needling, and other modalities.  Such measures may reduce the need for medications. Counseling for pain management, where patients learn to function and ignore/minimize their pain, seems to work very well.   9. Recommend changing family's attention and focus away from patient's headaches. Instead, emphasize daily activities. If first question of day is 'How are your  headaches/Do you have a headache today?', then patient will constantly think about headaches, thus making them worse. Goal is to re-direct attention away from headaches, toward daily activities and other distractions.   10. Helpful Websites: www.AmericanHeadacheSociety.org patenthood.ch www.headaches.org tightmarket.nl www.achenet.org

## 2024-10-24 ENCOUNTER — Telehealth: Payer: Self-pay

## 2024-10-24 MED ORDER — PRUCALOPRIDE SUCCINATE 1 MG PO TABS: 1.0000 | ORAL_TABLET | Freq: Every day | ORAL | 5 refills | Status: AC

## 2024-10-24 NOTE — Telephone Encounter (Signed)
 Auth Submission: NO AUTH NEEDED Site of care: Site of care: [facility] Payer: [place] [employer]  Medication & CPT/J Code(s) submitted: Vyepti  (Eptinezumab ) [id number] Diagnosis Code:  Route of submission (phone, fax, portal): phone Phone # Fax # Auth type: Buy/Bill HB Units/visits requested: 300mg  q87months Reference number:  Approval from: 10/24/24 to 10/16/25

## 2024-10-24 NOTE — Telephone Encounter (Signed)
 Ladonna, please have patient follow-up with me, non-urgent. Thanks!

## 2024-10-25 ENCOUNTER — Ambulatory Visit

## 2024-10-25 DIAGNOSIS — J302 Other seasonal allergic rhinitis: Secondary | ICD-10-CM

## 2024-10-28 NOTE — Telephone Encounter (Signed)
 Phoned and LMOVM for the pt to look at her MyChart message from 10/24/2024

## 2024-10-29 ENCOUNTER — Ambulatory Visit: Admitting: General Surgery

## 2024-10-30 ENCOUNTER — Ambulatory Visit: Admitting: General Surgery

## 2024-10-31 ENCOUNTER — Encounter: Attending: Internal Medicine | Admitting: Internal Medicine

## 2024-10-31 VITALS — BP 124/84 | HR 96 | Temp 98.1°F | Resp 16

## 2024-10-31 DIAGNOSIS — G43019 Migraine without aura, intractable, without status migrainosus: Secondary | ICD-10-CM | POA: Insufficient documentation

## 2024-10-31 MED ORDER — SODIUM CHLORIDE 0.9 % IV SOLN
300.0000 mg | Freq: Once | INTRAVENOUS | Status: AC
  Administered 2024-10-31: 300 mg via INTRAVENOUS
  Filled 2024-10-31: qty 3

## 2024-10-31 NOTE — Progress Notes (Signed)
 Diagnosis: Migraine  Provider:  Shaaron Charleston MD  Procedure: IV Infusion  IV Type: Peripheral, IV Location: R Antecubital  , Vyepti  (Eptinezumab -jjmr), Dose: 300 mg  Infusion Start Time: 1503  Infusion Stop Time: 1533  Post Infusion IV Care: Observation period completed  Discharge: Condition: Good, Destination: Home . AVS Declined  Performed by:  Imajean Mcdermid R, LPN

## 2024-11-08 NOTE — Addendum Note (Signed)
 Addended by: SHIRLEAN THERISA ORN on: 11/08/2024 11:10 AM   Modules accepted: Orders

## 2024-11-08 NOTE — Telephone Encounter (Signed)
Labs ordered for [facility].

## 2024-11-12 ENCOUNTER — Other Ambulatory Visit: Payer: Self-pay | Admitting: Adult Health

## 2024-11-12 ENCOUNTER — Encounter: Payer: Self-pay | Admitting: Physical Medicine and Rehabilitation

## 2024-11-14 ENCOUNTER — Ambulatory Visit: Admitting: Gastroenterology

## 2024-11-18 ENCOUNTER — Ambulatory Visit: Admitting: Internal Medicine

## 2024-11-18 ENCOUNTER — Ambulatory Visit: Admitting: Family Medicine

## 2024-11-18 LAB — CBC WITH DIFFERENTIAL/PLATELET
Basophils Absolute: 0.1 10*3/uL (ref 0.0–0.2)
Basos: 1 %
EOS (ABSOLUTE): 0.1 10*3/uL (ref 0.0–0.4)
Eos: 2 %
Hematocrit: 40.6 % (ref 34.0–46.6)
Hemoglobin: 12.8 g/dL (ref 11.1–15.9)
Immature Grans (Abs): 0 10*3/uL (ref 0.0–0.1)
Immature Granulocytes: 0 %
Lymphocytes Absolute: 3.6 10*3/uL — ABNORMAL HIGH (ref 0.7–3.1)
Lymphs: 44 %
MCH: 27.8 pg (ref 26.6–33.0)
MCHC: 31.5 g/dL (ref 31.5–35.7)
MCV: 88 fL (ref 79–97)
Monocytes Absolute: 0.6 10*3/uL (ref 0.1–0.9)
Monocytes: 7 %
Neutrophils Absolute: 3.7 10*3/uL (ref 1.4–7.0)
Neutrophils: 46 %
Platelets: 571 10*3/uL — ABNORMAL HIGH (ref 150–450)
RBC: 4.61 x10E6/uL (ref 3.77–5.28)
RDW: 12.2 % (ref 11.7–15.4)
WBC: 8.1 10*3/uL (ref 3.4–10.8)

## 2024-11-18 LAB — COMPREHENSIVE METABOLIC PANEL WITH GFR
ALT: 13 [IU]/L (ref 0–32)
AST: 13 [IU]/L (ref 0–40)
Albumin: 4.3 g/dL (ref 3.9–4.9)
Alkaline Phosphatase: 103 [IU]/L (ref 41–116)
BUN/Creatinine Ratio: 18 (ref 9–23)
BUN: 16 mg/dL (ref 6–24)
Bilirubin Total: 0.9 mg/dL (ref 0.0–1.2)
CO2: 23 mmol/L (ref 20–29)
Calcium: 9.8 mg/dL (ref 8.7–10.2)
Chloride: 101 mmol/L (ref 96–106)
Creatinine, Ser: 0.89 mg/dL (ref 0.57–1.00)
Globulin, Total: 2.5 g/dL (ref 1.5–4.5)
Glucose: 122 mg/dL — ABNORMAL HIGH (ref 70–99)
Potassium: 4.2 mmol/L (ref 3.5–5.2)
Sodium: 139 mmol/L (ref 134–144)
Total Protein: 6.8 g/dL (ref 6.0–8.5)
eGFR: 81 mL/min/{1.73_m2}

## 2024-11-18 LAB — ALPHA-GAL PANEL
Allergen Lamb IgE: <0.1 kU/L
Beef IgE: 0.1 kU/L — AB
IgE (Immunoglobulin E), Serum: 1249 [IU]/mL — AB (ref 6–495)
O215-IgE Alpha-Gal: <0.1 kU/L
Pork IgE: <0.1 kU/L

## 2024-11-18 LAB — IRON,TIBC AND FERRITIN PANEL
Ferritin: 42 ng/mL (ref 15–150)
Iron Saturation: 14 % — ABNORMAL LOW (ref 15–55)
Iron: 57 ug/dL (ref 27–159)
Total Iron Binding Capacity: 419 ug/dL (ref 250–450)
UIBC: 362 ug/dL (ref 131–425)

## 2024-11-18 LAB — SEDIMENTATION RATE: Sed Rate: 12 mm/h (ref 0–32)

## 2024-11-18 LAB — CELIAC DISEASE PANEL
Immunoglobulin A, (IgA) QN, Serum: 259 mg/dL (ref 87–352)
t-Transglutaminase (tTG) IgA: <2 U/mL (ref 0–3)

## 2024-11-18 LAB — C-REACTIVE PROTEIN: CRP: 6 mg/L (ref 0–10)

## 2024-11-18 MED ORDER — TRULANCE 3 MG PO TABS: 1.0000 | ORAL_TABLET | Freq: Every day | ORAL | 3 refills | Status: AC

## 2024-11-18 NOTE — Telephone Encounter (Signed)
 She has previously tried Linzess , Amitiza , Ibsrela , and Motegrity . Miralax OTC.   Dena: can we provide samples of Trulance  for her? I will likely have to do a formulary exception.

## 2024-11-20 ENCOUNTER — Ambulatory Visit: Admitting: Physical Medicine and Rehabilitation

## 2024-11-20 MED ORDER — LUBIPROSTONE 8 MCG PO CAPS: 8.0000 ug | ORAL_CAPSULE | Freq: Two times a day (BID) | ORAL | 3 refills | Status: AC

## 2024-11-20 NOTE — Addendum Note (Signed)
 Addended by: SHIRLEAN THERISA ORN on: 11/20/2024 06:03 PM   Modules accepted: Orders

## 2024-11-21 ENCOUNTER — Other Ambulatory Visit: Payer: Self-pay | Admitting: *Deleted

## 2024-11-21 DIAGNOSIS — R1084 Generalized abdominal pain: Secondary | ICD-10-CM

## 2024-11-21 DIAGNOSIS — R634 Abnormal weight loss: Secondary | ICD-10-CM

## 2024-11-21 NOTE — Telephone Encounter (Signed)
 Mindy/Tammy:  Please arrange CT abd/pelvis due to abdominal pain and weight loss.  Has had hysterectomy so no urine pregnancy needed.

## 2024-11-21 NOTE — Telephone Encounter (Signed)
 [facility] PA for CT:  Request ID:Not Available Tracking:[id number] Request Date: 11/21/2024 01:53 PM Status: In Review

## 2024-11-25 ENCOUNTER — Ambulatory Visit: Admitting: Physical Medicine and Rehabilitation

## 2024-11-28 ENCOUNTER — Ambulatory Visit: Admitting: General Surgery

## 2024-11-28 ENCOUNTER — Ambulatory Visit: Admitting: Gastroenterology

## 2025-01-10 ENCOUNTER — Ambulatory Visit: Admitting: Family Medicine

## 2025-01-29 ENCOUNTER — Ambulatory Visit

## 2025-05-19 ENCOUNTER — Ambulatory Visit: Admitting: Adult Health
